# Patient Record
Sex: Male | Born: 1935 | Race: Black or African American | Hispanic: No | Smoking: Former smoker
Health system: Southern US, Community
[De-identification: ages and names within clinical notes are randomized; demographics above are authoritative.]

## PROBLEM LIST (undated history)

## (undated) ENCOUNTER — Emergency Department: Payer: Medicare (Managed Care)

## (undated) DIAGNOSIS — M889 Osteitis deformans of unspecified bone: Secondary | ICD-10-CM

## (undated) DIAGNOSIS — N401 Enlarged prostate with lower urinary tract symptoms: Secondary | ICD-10-CM

## (undated) DIAGNOSIS — N138 Other obstructive and reflux uropathy: Secondary | ICD-10-CM

## (undated) DIAGNOSIS — C61 Malignant neoplasm of prostate: Secondary | ICD-10-CM

## (undated) DIAGNOSIS — E785 Hyperlipidemia, unspecified: Secondary | ICD-10-CM

## (undated) HISTORY — DX: Malignant neoplasm of prostate: C61

## (undated) HISTORY — PX: PROSTATE SURGERY: SHX751

## (undated) HISTORY — DX: Hyperlipidemia, unspecified: E78.5

## (undated) HISTORY — PX: CHOLECYSTECTOMY: SHX55

## (undated) HISTORY — DX: Osteitis deformans of unspecified bone: M88.9

## (undated) HISTORY — DX: Other obstructive and reflux uropathy: N40.1

## (undated) HISTORY — DX: Benign prostatic hyperplasia with lower urinary tract symptoms: N13.8

## (undated) HISTORY — PX: PROSTATE BIOPSY: SHX241

---

## 2004-02-10 ENCOUNTER — Emergency Department: Payer: Self-pay | Admitting: Unknown Physician Specialty

## 2004-02-10 ENCOUNTER — Emergency Department: Payer: Self-pay | Admitting: General Practice

## 2005-01-22 ENCOUNTER — Ambulatory Visit: Payer: Self-pay | Admitting: Ophthalmology

## 2005-05-20 ENCOUNTER — Ambulatory Visit: Payer: Self-pay | Admitting: Internal Medicine

## 2005-05-24 ENCOUNTER — Ambulatory Visit: Payer: Self-pay | Admitting: Specialist

## 2007-04-08 ENCOUNTER — Emergency Department: Payer: Self-pay | Admitting: Emergency Medicine

## 2007-04-08 ENCOUNTER — Other Ambulatory Visit: Payer: Self-pay

## 2007-04-28 ENCOUNTER — Ambulatory Visit: Payer: Self-pay | Admitting: Internal Medicine

## 2007-05-05 ENCOUNTER — Ambulatory Visit: Payer: Self-pay | Admitting: Internal Medicine

## 2007-05-18 ENCOUNTER — Ambulatory Visit: Payer: Self-pay | Admitting: Internal Medicine

## 2007-11-09 ENCOUNTER — Ambulatory Visit: Payer: Self-pay | Admitting: Specialist

## 2007-11-25 ENCOUNTER — Ambulatory Visit: Payer: Self-pay | Admitting: Specialist

## 2007-12-02 ENCOUNTER — Ambulatory Visit: Payer: Self-pay | Admitting: Specialist

## 2007-12-08 ENCOUNTER — Ambulatory Visit: Payer: Self-pay | Admitting: Internal Medicine

## 2008-06-23 ENCOUNTER — Ambulatory Visit: Payer: Self-pay | Admitting: Internal Medicine

## 2008-11-04 ENCOUNTER — Ambulatory Visit: Payer: Self-pay | Admitting: Internal Medicine

## 2009-01-30 ENCOUNTER — Ambulatory Visit: Payer: Self-pay

## 2010-07-17 ENCOUNTER — Observation Stay: Payer: Self-pay | Admitting: Internal Medicine

## 2011-03-12 ENCOUNTER — Ambulatory Visit: Payer: Self-pay | Admitting: Family

## 2011-12-02 ENCOUNTER — Ambulatory Visit: Payer: Self-pay | Admitting: Ophthalmology

## 2011-12-10 ENCOUNTER — Ambulatory Visit: Payer: Self-pay | Admitting: Ophthalmology

## 2012-02-05 ENCOUNTER — Ambulatory Visit: Payer: Self-pay | Admitting: Ophthalmology

## 2012-04-22 ENCOUNTER — Ambulatory Visit: Payer: Self-pay | Admitting: Internal Medicine

## 2012-11-05 ENCOUNTER — Ambulatory Visit: Payer: Self-pay | Admitting: Urology

## 2012-11-16 ENCOUNTER — Emergency Department: Payer: Self-pay | Admitting: Emergency Medicine

## 2012-11-25 ENCOUNTER — Ambulatory Visit: Payer: Self-pay | Admitting: Internal Medicine

## 2012-11-26 DIAGNOSIS — C7951 Secondary malignant neoplasm of bone: Secondary | ICD-10-CM | POA: Insufficient documentation

## 2012-11-27 ENCOUNTER — Ambulatory Visit: Payer: Self-pay | Admitting: Internal Medicine

## 2012-12-14 ENCOUNTER — Ambulatory Visit: Payer: Self-pay | Admitting: Internal Medicine

## 2013-04-19 ENCOUNTER — Ambulatory Visit: Payer: Self-pay | Admitting: Urology

## 2013-09-10 ENCOUNTER — Ambulatory Visit: Payer: Self-pay | Admitting: Internal Medicine

## 2014-05-03 NOTE — Op Note (Signed)
PATIENT NAME:  Dean White, Dean White MR#:  [id number] DATE OF BIRTH:  August 08, 1928  DATE OF PROCEDURE:  12/10/2011  PREOPERATIVE DIAGNOSIS: Visually significant cataract of the right eye.   POSTOPERATIVE DIAGNOSIS: Visually significant cataract of the right eye.   OPERATIVE PROCEDURE: Cataract extraction by phacoemulsification with implant of intraocular lens to right eye.   SURGEON: Birder Robson, MD.   ANESTHESIA:  1. Managed anesthesia care.  2. Topical tetracaine drops followed by 2% Xylocaine jelly applied in the preoperative holding area.   COMPLICATIONS: None.   TECHNIQUE:  Stop and chop.  DESCRIPTION OF PROCEDURE: The patient was examined and consented in the preoperative holding area where the aforementioned topical anesthesia was applied to the right eye and then brought back to the Operating Room where the right eye was prepped and draped in the usual sterile ophthalmic fashion and a lid speculum was placed. A paracentesis was created with the side port blade and the anterior chamber was filled with viscoelastic. A near clear corneal incision was performed with the steel keratome. A continuous curvilinear capsulorrhexis was performed with a cystotome followed by the capsulorrhexis forceps. Hydrodissection and hydrodelineation were carried out with BSS on a blunt cannula. The lens was removed in a stop and chop technique and the remaining cortical material was removed with the irrigation-aspiration handpiece. The capsular bag was inflated with viscoelastic and the Tecnis ZCB00 20.5-diopter lens, serial number [id number] was placed in the capsular bag without complication. The remaining viscoelastic was removed from the eye with the irrigation-aspiration handpiece. The wounds were hydrated. The anterior chamber was flushed with Miostat and the eye was inflated to physiologic pressure. The wounds were found to be water tight. The eye was dressed with Vigamox. The patient was given protective  glasses to wear throughout the day and a shield with which to sleep tonight. The patient was also given drops with which to begin a drop regimen today and will follow-up with me in one day.  ____________________________ Livingston Diones. Trejan Buda, MD wlp:slb D: 12/10/2011 16:33:04 ET T: 12/10/2011 16:51:10 ET JOB#: [id number]  cc: Sinclaire Artiga L. Uri Covey, MD, <Dictator> Livingston Diones Jabez Molner MD ELECTRONICALLY SIGNED 12/19/2011 15:44

## 2014-05-06 NOTE — Op Note (Signed)
PATIENT NAME:  Dean White, Dean White MR#:  [id number] DATE OF BIRTH:  1928/11/05  DATE OF PROCEDURE:  02/05/2012  PROCEDURES PERFORMED:  1.  Pars plana vitrectomy of the right eye.  2.  Internal limiting membrane peel of the right eye.   PREOPERATIVE DIAGNOSES:  1.  Epiretinal membrane of the right eye.  2.  Retinal edema of the right eye.   POSTOPERATIVE DIAGNOSES: 1.  Epiretinal membrane of the right eye.  2.  Retinal edema of the right eye.   ESTIMATED BLOOD LOSS: Less than 1 mL.  PRIMARY SURGEON: Garlan Fair, M.D.   ANESTHESIA: Retrobulbar block of the right eye with monitored anesthesia care.   COMPLICATIONS: None.   INDICATIONS FOR PROCEDURE: This patient presented to my office with slowly decreasing vision in the right eye. Examination revealed an epiretinal membrane with associated retinal edema. The risks, benefits, and alternatives of the above procedure were discussed and the patient wished to proceed.   DETAILS OF PROCEDURE: After informed consent was obtained, the patient was brought into the operative suite at [facility]. The patient was placed in supine position, was given a small dose of Alfenta and a retrobulbar block was performed on the right eye by the primary surgeon without complication. The right eye was prepped and draped in sterile manner. After a lid speculum was inserted, a 25-gauge trocar was placed inferotemporally through displaced conjunctiva in an oblique fashion 3 mm beyond the limbus. The infusion cannula was turned on and inserted through the trocar and secured in position with Steri-Strips. Two more trocars were placed in a similar fashion superotemporally and superonasally. The vitreous cutter and light pipe were introduced in the eye and a core vitrectomy was performed. Vitreous face was confirmed and elevated via suction and visualization. Vitreous face was removed and peripheral vitreous was trimmed for 360 degrees. Indocyanine  green was injected onto the posterior pole and removed within 30 seconds. Intraocular forceps were utilized to remove the epiretinal membrane and internal limiting membrane in total for a total diameter of 2 disk diameters around the fovea. Scleral depressed exam was performed and no signs of any breaks, tears, or retinal detachment could be identified for 360 degrees. A partial air-fluid exchange was performed. The trocars were removed and the wounds were noted to be airtight. Pressure in the eye was confirmed to be approximately 15 mmHg and 5 mg of dexamethasone was given into the inferior fornix. The lid speculum was removed and the eye was cleaned. TobraDex was placed on the eye. A patch and shield were placed over the eye and the patient was taken to postanesthesia care with instructions to remain head up.   ____________________________ Teresa Pelton. Starling Manns, MD mfa:aw D: 02/05/2012 09:05:17 ET T: 02/05/2012 09:31:21 ET JOB#: [id number]  cc: Teresa Pelton. Starling Manns, MD, <Dictator> Coralee Rud MD ELECTRONICALLY SIGNED 02/05/2012 9:59

## 2014-05-31 ENCOUNTER — Ambulatory Visit
Admission: RE | Admit: 2014-05-31 | Discharge: 2014-05-31 | Disposition: A | Discharge status: Home / Self Care | Payer: Medicare Other | Source: Ambulatory Visit | Attending: Internal Medicine | Admitting: Internal Medicine

## 2014-05-31 ENCOUNTER — Other Ambulatory Visit: Payer: Self-pay | Admitting: Internal Medicine

## 2014-05-31 ENCOUNTER — Ambulatory Visit
Admission: RE | Admit: 2014-05-31 | Discharge: 2014-05-31 | Disposition: A | Discharge status: Home / Self Care | Payer: Medicare Other | Source: Ambulatory Visit | Attending: Cardiology | Admitting: Cardiology

## 2014-05-31 DIAGNOSIS — R079 Chest pain, unspecified: Secondary | ICD-10-CM

## 2014-07-06 ENCOUNTER — Encounter (INDEPENDENT_AMBULATORY_CARE_PROVIDER_SITE_OTHER): Payer: Self-pay

## 2014-07-06 ENCOUNTER — Ambulatory Visit (INDEPENDENT_AMBULATORY_CARE_PROVIDER_SITE_OTHER): Payer: Medicare Other | Admitting: Urology

## 2014-07-06 ENCOUNTER — Encounter: Payer: Self-pay | Admitting: Urology

## 2014-07-06 VITALS — BP 160/72 | HR 82 | Ht 67.0 in | Wt 174.3 lb

## 2014-07-06 DIAGNOSIS — C61 Malignant neoplasm of prostate: Secondary | ICD-10-CM

## 2014-07-06 MED ORDER — LEUPROLIDE ACETATE (3 MONTH) 22.5 MG IM KIT
22.5000 mg | PACK | Freq: Once | INTRAMUSCULAR | Status: AC
  Administered 2014-07-06: 22.5 mg via INTRAMUSCULAR

## 2014-07-06 NOTE — Progress Notes (Signed)
Lupron IM Injection   Due to Prostate Cancer patient is present today for a Lupron Injection.  Medication: Lupron 3 month Dose: 22.5mg   Location: left upper outer buttocks Lot: [id number] Exp: [id number]  Patient tolerated well, no complications were noted  Performed by: Lyndee Hensen CMA  Follow up: 3 months

## 2014-07-06 NOTE — Progress Notes (Signed)
07/06/2014 10:49 AM   Dean White 1928-02-04 [id number]  Referring provider: Cletis Athens, MD [address] [place], [place] [address]  Chief Complaint  Patient presents with  . 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 PSA History:    7.3 ng/mL on 00/00/2013    9.4 ng/mL on 08/00/2013    8.3 ng/mL on 02/00/2014  11.3 ng/mL on 09/00/2014- Patient was started on ADT and Xgeva    2.2 ng/mL on 09/13/2013  13.3 ng/mL on 03/18/2014-Patient had discontinued ADT  Bone scan completed on 11/05/2012 noted 3 different areas of high suspicion for metastatic disease.    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 Patient was seen by Dean White and given Reclast.  Patient was restarted on his Lupron three months ago.  He is having severe hot flashes and would like to discontinue the Lupron.   His most recent PSA is 2.0 ng/mL drawn at his PCP's office last month.     PMH: Past Medical History  Diagnosis Date  . HLD (hyperlipidemia)   . Paget disease of bone   . Adenocarcinoma of prostate   . BPH with obstruction/lower urinary tract symptoms     Surgical History: Past Surgical History  Procedure Laterality Date  . Cholecystectomy    . Prostate surgery    . Prostate biopsy      Home Medications:    Medication List       This list is accurate as of: 07/06/14 10:49 AM.  Always use your most recent med list.               aspirin 81 MG tablet  Take 81 mg by mouth daily.     cholecalciferol 1000 UNITS tablet  Commonly known as:  VITAMIN D  Take 1,000 Units by mouth daily.     gabapentin 100 MG capsule  Commonly known as:  NEURONTIN  Take 100 mg by mouth 3 (three) times daily.     latanoprost 0.005 % ophthalmic solution  Commonly known as:  XALATAN  place 1 drop into both eyes at bedtime     lovastatin 40 MG tablet  Commonly known as:  MEVACOR  Take 40 mg by mouth at bedtime.     multivitamin tablet  Take 1 tablet by mouth daily.     omeprazole 40 MG capsule  Commonly known as:  PRILOSEC  Take 40 mg by mouth daily.     PROSTATE HEALTH PO  Take by mouth.        Allergies: No Known Allergies  Family History: Family History  Problem Relation Age of Onset  . Cancer Son     melanoma  . Kidney disease Neg Hx   . Prostate cancer Neg Hx   . Breast cancer Sister   . Cancer Brother     Social History:  reports that he has quit smoking. He does not have any smokeless tobacco history on file. He reports that he does not drink alcohol or use illicit drugs.  ROS: Urological Symptom Review  Patient is experiencing the following symptoms: Erection problems (male only)   Review of Systems  Gastrointestinal (upper)  :  Negative for upper GI symptoms  Gastrointestinal (lower) : Negative for lower GI symptoms  Constitutional : Night Sweats Fatigue  Skin: Negative for skin symptoms  Eyes: Negative for eye symptoms  Ear/Nose/Throat : Negative for Ear/Nose/Throat symptoms  Hematologic/Lymphatic: Negative for Hematologic/Lymphatic symptoms  Cardiovascular : Negative for cardiovascular symptoms  Respiratory : Cough  Endocrine: Negative for endocrine symptoms  Musculoskeletal: Back pain Joint pain  Neurological: Negative for neurological symptoms  Psychologic: Negative for psychiatric symptoms   Physical Exam: BP 160/72 mmHg  Pulse 82  Ht 5\' 7"  (1.702 m)  Wt 174 lb 4.8 oz (79.062 kg)  BMI 27.29 kg/m2   Laboratory Data: No results found for this or any previous visit. No results found for: WBC, HGB, HCT, MCV, PLT  No results found for: CREATININE  No results found for: PSA  No results found for: TESTOSTERONE  No  results found for: HGBA1C  Urinalysis No results found for: COLORURINE, APPEARANCEUR, LABSPEC, PHURINE, GLUCOSEU, HGBUR, BILIRUBINUR, KETONESUR, PROTEINUR, UROBILINOGEN, NITRITE, LEUKOCYTESUR  Pertinent Imaging:   Assessment & Plan:    1. Prostate cancer:  TURP in 2009 found inverted papilloma. TRUSPBx of prostate 11/2008 noted a Gleason's 6 (3+3) in one core with a PSA of 5.5 ng/mL and a prostate volume of 50cc. PSA History: 7.3 ng/mL on 00/00/2013 9.4 ng/mL on 08/00/2013 8.3 ng/mL on 02/00/2014 11.3 ng/mL on 09/00/2014 Lupron and Xgeva was discontinued on 08/00/2015 2.2 ng/mL on 09/13/2013 13.3 ng/mL on 03/18/2014-Patient had discontinued ADT 2.0 ng/mL last month, verbal per PCP's office.  Patient would not sign the medical release for his records.    I spoke with Dean White concerning this patient. He is treating his Paget's disease. His bone scan was negative for mets and he was given a Reclast injection. We restarted his Lupron three months ago.  He is having severe hot flashes.  I discussed with the patient that the hot flashes may diminish over time.  He has agreed to one more injection and then we will reassess at his return appointment in three months.  We will obtain a PSA at that visit.     No Follow-up on file.  Dean White, [facility] [address], [place], [place] [address] [phone]  n

## 2014-07-10 DIAGNOSIS — C61 Malignant neoplasm of prostate: Secondary | ICD-10-CM | POA: Insufficient documentation

## 2014-10-06 ENCOUNTER — Encounter: Payer: Self-pay | Admitting: Urology

## 2014-10-06 ENCOUNTER — Ambulatory Visit (INDEPENDENT_AMBULATORY_CARE_PROVIDER_SITE_OTHER): Payer: Medicare Other | Admitting: Urology

## 2014-10-06 VITALS — BP 117/74 | HR 67 | Ht 68.0 in | Wt 177.1 lb

## 2014-10-06 DIAGNOSIS — C61 Malignant neoplasm of prostate: Secondary | ICD-10-CM

## 2014-10-06 DIAGNOSIS — IMO0002 Reserved for concepts with insufficient information to code with codable children: Secondary | ICD-10-CM

## 2014-10-06 DIAGNOSIS — N359 Urethral stricture, unspecified: Secondary | ICD-10-CM

## 2014-10-06 MED ORDER — LEUPROLIDE ACETATE (3 MONTH) 22.5 MG IM KIT
22.5000 mg | PACK | Freq: Once | INTRAMUSCULAR | Status: AC
  Administered 2014-10-06: 22.5 mg via INTRAMUSCULAR

## 2014-10-06 NOTE — Progress Notes (Signed)
Lupron IM Injection   Due to Prostate Cancer patient is present today for a Lupron Injection.  Medication: Lupron 3 month Dose: 22.5mg  mg  Location: left upper outer buttocks Lot: [id number] Exp: 02/02/2017  Patient tolerated well, no complications were noted  Performed by: Lyndee Hensen CMA  Follow up: 3 months

## 2014-10-06 NOTE — Progress Notes (Signed)
10/06/2014 10:58 AM   Dean White [birth year] [id number]  Referring provider: Cletis Athens, MD [address] [place], [place] [address]  Chief Complaint  Patient presents with  . Follow-up    Prostate cancer and lupron injection    HPI: Patient is an 81 year old African-American male with metastatic prostate cancer who presents today for his 3 month Lupron injection.  He is complaining of intense hot flashes.  The hot flashes are becoming more intense and frequent.  He is also complaining of some difficulty in passing his urine.  He does feel that he is emptying his bladder completely.  He denies any weight loss or appetite loss. He also denies any fevers, chills, nausea or vomiting.  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  Bone scan completed on 11/05/2012 noted 3 different areas of high suspicion for metastatic disease. 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 Patient was seen by Dr. Ronnald Collum and given Reclast.  PMH: Past Medical History  Diagnosis Date  . HLD (hyperlipidemia)   . Paget disease of bone   . Adenocarcinoma of prostate   . BPH with obstruction/lower urinary tract symptoms     Surgical History: Past Surgical History  Procedure Laterality Date  . Cholecystectomy    . Prostate surgery    . Prostate biopsy      Home Medications:    Medication List       This list is accurate as of: 10/06/14 10:58 AM.  Always use your most recent med list.               aspirin 81 MG tablet  Take 81 mg by mouth daily.     cholecalciferol 1000 UNITS tablet  Commonly known as:  VITAMIN D  Take 1,000 Units by mouth daily.     gabapentin 100 MG capsule  Commonly known as:  NEURONTIN  Take 100 mg by mouth 3 (three) times  daily.     latanoprost 0.005 % ophthalmic solution  Commonly known as:  XALATAN  place 1 drop into both eyes at bedtime     lovastatin 40 MG tablet  Commonly known as:  MEVACOR  Take 40 mg by mouth at bedtime.     multivitamin tablet  Take 1 tablet by mouth daily.     omeprazole 40 MG capsule  Commonly known as:  PRILOSEC  Take 40 mg by mouth daily.     PROSTATE HEALTH PO  Take by mouth.        Allergies: No Known Allergies  Family History: Family History  Problem Relation Age of Onset  . Cancer Son     melanoma  . Kidney disease Neg Hx   . Prostate cancer Neg Hx   . Breast cancer Sister   . Cancer Brother     Social History:  reports that he has quit smoking. He does not have any smokeless tobacco history on file. He reports that he does not drink alcohol or use illicit drugs.  ROS: UROLOGY Frequent Urination?: No Hard to postpone urination?: No Burning/pain with urination?: No Get up at night to urinate?: No Leakage of urine?: No Urine stream starts and stops?: No Trouble starting stream?: No Do you have to  strain to urinate?: No Blood in urine?: No Urinary tract infection?: No Sexually transmitted disease?: No Injury to kidneys or bladder?: No Painful intercourse?: No Weak stream?: No Erection problems?: No Penile pain?: No  Gastrointestinal Nausea?: No Vomiting?: No Indigestion/heartburn?: No Diarrhea?: No Constipation?: No  Constitutional Fever: No Night sweats?: Yes Weight loss?: No Fatigue?: No  Skin Skin rash/lesions?: No Itching?: No  Eyes Blurred vision?: No Double vision?: No  Ears/Nose/Throat Sore throat?: No Sinus problems?: No  Hematologic/Lymphatic Swollen glands?: No Easy bruising?: No  Cardiovascular Leg swelling?: No Chest pain?: No  Respiratory Cough?: No Shortness of breath?: No  Endocrine Excessive thirst?: No  Musculoskeletal Back pain?: Yes Joint pain?: Yes  Neurological Headaches?:  No Dizziness?: No  Psychologic Depression?: No Anxiety?: No  Physical Exam: BP 117/74 mmHg  Pulse 67  Ht 5\' 8"  (1.727 m)  Wt 177 lb 1.6 oz (80.332 kg)  BMI 26.93 kg/m2  GU: Patient with uncircumcised phallus. Foreskin easily retracted  Urethral meatus is small in caliber.  No penile discharge. No penile lesions or rashes. Scrotum without lesions, cysts, rashes and/or edema.  Testicles are located scrotally bilaterally. No masses are appreciated in the testicles. Left and right epididymis are normal. Rectal: Patient with  normal sphincter tone. Perineum without scarring or rashes. No rectal masses are appreciated. Prostate is approximately 60 grams, no nodules are appreciated. Seminal vesicles are normal.   Laboratory Data:  PSA History:  7.3 ng/mL on 00/00/2013  9.4 ng/mL on 08/00/2013  8.3 ng/mL on 02/00/2014 11.3 ng/mL on 09/00/2014- Patient was started on ADT and Xgeva  2.2 ng/mL on 09/13/2013 13.3 ng/mL on 03/18/2014-Patient had discontinued ADT    2.0 ng/mL on 05/00/2016  Assessment & Plan:    1. Prostate cancer:  TURP in 2009 found inverted papilloma. TRUSPBx of prostate 11/2008 noted a Gleason's 6 (3+3) in one core with a PSA of 5.5 ng/mL and a prostate volume of 50cc.   He is still having severe hot flashes. I discussed with the patient that the hot flashes may diminish over time. Patient wanted to know if he could discontinue the Lupron injections. I explained to him the role of Lupron with metastatic prostate cancer and the likelihood that the prostate cancer would continue to spread without ADT. He would like to continue the Lupron at this time.  He did receive a 3 month Lupron injection at this visit. He will return in 3 months for his next Lupron injection.   - PSA  2. Meatal stenosis:   Patient's meatus is of a reduced caliber.  He is still able to void at this time and does not complain of incomplete bladder emptying or suprapubic pain.  I advised him on  foreskin hygiene (pulling back the foreskin, washing the glands with soapy water and drying with a hairdryer twice daily).  We will reassess when he returns in 3 months.  If he is unable to void, he needs to contact our office immediately or seek treatment in the ED.     Return in about 3 months (around 01/05/2015) for Lupron injection and office visit.  [name], [facility] [address], [place], [place] [address] [phone]

## 2014-10-07 LAB — PSA: PROSTATE SPECIFIC AG, SERUM: 1.4 ng/mL (ref 0.0–4.0)

## 2014-10-17 ENCOUNTER — Telehealth: Payer: Self-pay | Admitting: *Deleted

## 2014-10-17 NOTE — Telephone Encounter (Signed)
Spoke with patient, gave results and next appointment info. Labs to be drawn one week prior to appt. Patient ok with plan

## 2014-10-17 NOTE — Telephone Encounter (Signed)
-----   Message from Nori Riis, PA-C sent at 10/17/2014  2:43 PM EDT ----- Patient's PSA is stable.  We repeat in  6 months.  PSA to be drawn before his next appointment.

## 2015-01-02 ENCOUNTER — Ambulatory Visit: Payer: Medicare Other | Admitting: Urology

## 2015-01-03 ENCOUNTER — Ambulatory Visit: Payer: Medicare Other | Admitting: Urology

## 2015-01-17 ENCOUNTER — Ambulatory Visit (INDEPENDENT_AMBULATORY_CARE_PROVIDER_SITE_OTHER): Payer: Medicare Other | Admitting: Urology

## 2015-01-17 ENCOUNTER — Encounter: Payer: Self-pay | Admitting: Urology

## 2015-01-17 ENCOUNTER — Encounter: Payer: Self-pay | Admitting: Endocrinology

## 2015-01-17 VITALS — BP 133/78 | HR 84 | Ht 67.0 in | Wt 177.8 lb

## 2015-01-17 DIAGNOSIS — Z8546 Personal history of malignant neoplasm of prostate: Secondary | ICD-10-CM

## 2015-01-17 DIAGNOSIS — C61 Malignant neoplasm of prostate: Secondary | ICD-10-CM

## 2015-01-17 MED ORDER — LEUPROLIDE ACETATE (3 MONTH) 22.5 MG IM KIT
22.5000 mg | PACK | Freq: Once | INTRAMUSCULAR | Status: AC
  Administered 2015-01-17: 22.5 mg via INTRAMUSCULAR

## 2015-01-17 NOTE — Progress Notes (Signed)
12:13 PM   Dean White [birth year] [id number]  Referring provider: Cletis Athens, MD [address] [place], [place] [address]  Chief Complaint  Patient presents with  . Prostate Cancer    3 month Lupron injection    HPI: Patient is an 82 year old African-American male with metastatic prostate cancer who presents today for his 3 month Lupron injection.  He is complaining of intense hot flashes.  The hot flashes are becoming more intense and frequent.   He denies any weight loss or appetite loss. He also denies any fevers, chills, nausea or vomiting.  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  Bone scan completed on 11/05/2012 noted 3 different areas of high suspicion for metastatic disease. 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 Patient was seen by Dean White and given Reclast.   He has an upcoming appointment for a repeated bone scan next week.  PMH: Past Medical History  Diagnosis Date  . HLD (hyperlipidemia)   . Paget disease of bone   . Adenocarcinoma of prostate ([place])   . BPH with obstruction/lower urinary tract symptoms     Surgical History: Past Surgical History  Procedure Laterality Date  . Cholecystectomy    . Prostate surgery    . Prostate biopsy      Home Medications:    Medication List       This list is accurate as of: 01/17/15 11:59 PM.  Always use your most recent med list.               aspirin 81 MG tablet  Take 81 mg by mouth daily.     cholecalciferol 1000 units tablet  Commonly known as:  VITAMIN D  Take 1,000 Units by mouth daily.     gabapentin 100 MG capsule  Commonly known as:  NEURONTIN  Take 100 mg by mouth 3 (three) times daily.     latanoprost 0.005 % ophthalmic solution  Commonly  known as:  XALATAN  place 1 drop into both eyes at bedtime     lovastatin 40 MG tablet  Commonly known as:  MEVACOR  Take 40 mg by mouth at bedtime.     multivitamin tablet  Take 1 tablet by mouth daily.     omeprazole 40 MG capsule  Commonly known as:  PRILOSEC  Take 40 mg by mouth daily. Reported on 01/17/2015     PROSTATE HEALTH PO  Take by mouth.        Allergies: No Known Allergies  Family History: Family History  Problem Relation Age of Onset  . Cancer Son     melanoma  . Kidney disease Neg Hx   . Prostate cancer Neg Hx   . Breast cancer Sister   . Cancer Brother     Social History:  reports that he has quit smoking. He does not have any smokeless tobacco history on file. He reports that he does not drink alcohol or use illicit drugs.  ROS: UROLOGY Frequent Urination?: No Hard to postpone urination?: No Burning/pain with urination?: No Get up at night to urinate?: No Leakage of urine?: No Urine stream starts and stops?: Yes Trouble starting stream?: No Do you have to strain to urinate?: No  Blood in urine?: No Urinary tract infection?: No Sexually transmitted disease?: No Injury to kidneys or bladder?: No Painful intercourse?: No Weak stream?: No Erection problems?: No Penile pain?: No  Gastrointestinal Nausea?: No Vomiting?: No Indigestion/heartburn?: Yes Diarrhea?: No Constipation?: No  Constitutional Fever: No Night sweats?: Yes Weight loss?: Yes Fatigue?: Yes  Skin Skin rash/lesions?: No Itching?: No  Eyes Blurred vision?: No Double vision?: No  Ears/Nose/Throat Sore throat?: No Sinus problems?: No  Hematologic/Lymphatic Swollen glands?: No Easy bruising?: No  Cardiovascular Leg swelling?: No Chest pain?: No  Respiratory Cough?: No Shortness of breath?: No  Endocrine Excessive thirst?: No  Musculoskeletal Back pain?: Yes Joint pain?: Yes  Neurological Headaches?: No Dizziness?: No  Psychologic Depression?:  No Anxiety?: No  Physical Exam: BP 133/78 mmHg  Pulse 84  Ht 5\' 7"  (1.702 m)  Wt 177 lb 12.8 oz (80.65 kg)  BMI 27.84 kg/m2  GU: Patient with uncircumcised phallus. Foreskin easily retracted  Urethral meatus is small in caliber.  No penile discharge. No penile lesions or rashes. Scrotum without lesions, cysts, rashes and/or edema.  Testicles are located scrotally bilaterally. No masses are appreciated in the testicles. Left and right epididymis are normal. Rectal: Patient with  normal sphincter tone. Perineum without scarring or rashes. No rectal masses are appreciated. Prostate is approximately 60 grams, no nodules are appreciated. Seminal vesicles are normal.   Laboratory Data:  PSA History:  7.3 ng/mL on 00/00/2013  9.4 ng/mL on 08/00/2013  8.3 ng/mL on 02/00/2014 11.3 ng/mL on 09/00/2014- Patient was started on ADT and Xgeva  2.2 ng/mL on 09/13/2013 13.3 ng/mL on 03/18/2014-Patient had discontinued ADT    2.0 ng/mL on 05/00/2016    1.2 ng/ml on 12/30/2014  Assessment & Plan:    1. Prostate cancer:  TURP in 2009 found inverted papilloma. TRUSPBx of prostate 11/2008 noted a Gleason's 6 (3+3) in one core with a PSA of 5.5 ng/mL and a prostate volume of 50cc.   He is still having severe hot flashes. I explained to him the role of Lupron with metastatic prostate cancer and the likelihood that the prostate cancer would continue to spread without ADT.  I also explained that he did have some areas of possible passed disease on a bone scan and he is at risk of a pathological fracture if he does not continue the ADT therapy.   I offered him a referral to the cancer center, so that he may discuss other options of treatments that may be available to him.   He does not want a referral to the cancer Center at this time and he would like to continue the Lupron at this time.  He did receive a 3 month Lupron injection at this visit. He will return in 3 months for his next Lupron injection.      Return in about 3 months (around 04/17/2015) for lupron and exam.  Dean White, [facility] [address], [place], [place] [address] [phone]

## 2015-01-17 NOTE — Progress Notes (Signed)
Lupron IM Injection   Due to Prostate Cancer patient is present today for a Lupron Injection.  Medication: Lupron 3 month Dose: 22.5 mg  Location: left upper outer buttocks Lot: [id number] Exp: [id number]  Patient tolerated well, no complications were noted  Performed by: Lyndee Hensen CMA  Follow up: 3 months

## 2015-01-19 DIAGNOSIS — M5136 Other intervertebral disc degeneration, lumbar region: Secondary | ICD-10-CM | POA: Diagnosis not present

## 2015-01-19 DIAGNOSIS — M1990 Unspecified osteoarthritis, unspecified site: Secondary | ICD-10-CM | POA: Diagnosis not present

## 2015-01-19 DIAGNOSIS — M8888 Osteitis deformans of other bones: Secondary | ICD-10-CM | POA: Diagnosis not present

## 2015-01-19 DIAGNOSIS — E782 Mixed hyperlipidemia: Secondary | ICD-10-CM | POA: Diagnosis not present

## 2015-01-19 DIAGNOSIS — M13832 Other specified arthritis, left wrist: Secondary | ICD-10-CM | POA: Diagnosis not present

## 2015-01-19 DIAGNOSIS — N400 Enlarged prostate without lower urinary tract symptoms: Secondary | ICD-10-CM | POA: Diagnosis not present

## 2015-01-19 DIAGNOSIS — K219 Gastro-esophageal reflux disease without esophagitis: Secondary | ICD-10-CM | POA: Diagnosis not present

## 2015-01-26 DIAGNOSIS — N400 Enlarged prostate without lower urinary tract symptoms: Secondary | ICD-10-CM | POA: Diagnosis not present

## 2015-01-26 DIAGNOSIS — C61 Malignant neoplasm of prostate: Secondary | ICD-10-CM | POA: Diagnosis not present

## 2015-01-26 DIAGNOSIS — K219 Gastro-esophageal reflux disease without esophagitis: Secondary | ICD-10-CM | POA: Diagnosis not present

## 2015-01-26 DIAGNOSIS — M1990 Unspecified osteoarthritis, unspecified site: Secondary | ICD-10-CM | POA: Diagnosis not present

## 2015-01-26 DIAGNOSIS — M8888 Osteitis deformans of other bones: Secondary | ICD-10-CM | POA: Diagnosis not present

## 2015-01-26 DIAGNOSIS — E782 Mixed hyperlipidemia: Secondary | ICD-10-CM | POA: Diagnosis not present

## 2015-01-26 DIAGNOSIS — M5136 Other intervertebral disc degeneration, lumbar region: Secondary | ICD-10-CM | POA: Diagnosis not present

## 2015-02-14 DIAGNOSIS — H40003 Preglaucoma, unspecified, bilateral: Secondary | ICD-10-CM | POA: Diagnosis not present

## 2015-04-10 DIAGNOSIS — N400 Enlarged prostate without lower urinary tract symptoms: Secondary | ICD-10-CM | POA: Diagnosis not present

## 2015-04-10 DIAGNOSIS — M1990 Unspecified osteoarthritis, unspecified site: Secondary | ICD-10-CM | POA: Diagnosis not present

## 2015-04-10 DIAGNOSIS — M5136 Other intervertebral disc degeneration, lumbar region: Secondary | ICD-10-CM | POA: Diagnosis not present

## 2015-04-10 DIAGNOSIS — C61 Malignant neoplasm of prostate: Secondary | ICD-10-CM | POA: Diagnosis not present

## 2015-04-10 DIAGNOSIS — K219 Gastro-esophageal reflux disease without esophagitis: Secondary | ICD-10-CM | POA: Diagnosis not present

## 2015-04-10 DIAGNOSIS — E782 Mixed hyperlipidemia: Secondary | ICD-10-CM | POA: Diagnosis not present

## 2015-04-10 DIAGNOSIS — M8888 Osteitis deformans of other bones: Secondary | ICD-10-CM | POA: Diagnosis not present

## 2015-05-09 DIAGNOSIS — M5093 Cervical disc disorder, unspecified, cervicothoracic region: Secondary | ICD-10-CM | POA: Diagnosis not present

## 2015-05-09 DIAGNOSIS — M818 Other osteoporosis without current pathological fracture: Secondary | ICD-10-CM | POA: Diagnosis not present

## 2015-05-09 DIAGNOSIS — M76891 Other specified enthesopathies of right lower limb, excluding foot: Secondary | ICD-10-CM | POA: Diagnosis not present

## 2015-07-20 ENCOUNTER — Ambulatory Visit (INDEPENDENT_AMBULATORY_CARE_PROVIDER_SITE_OTHER): Payer: Medicare Other | Admitting: Urology

## 2015-07-20 ENCOUNTER — Encounter: Payer: Self-pay | Admitting: Urology

## 2015-07-20 VITALS — BP 133/65 | HR 56 | Ht 67.0 in | Wt 165.0 lb

## 2015-07-20 DIAGNOSIS — C61 Malignant neoplasm of prostate: Secondary | ICD-10-CM | POA: Diagnosis not present

## 2015-07-20 DIAGNOSIS — Z8546 Personal history of malignant neoplasm of prostate: Secondary | ICD-10-CM | POA: Diagnosis not present

## 2015-07-20 NOTE — Progress Notes (Signed)
10:15 AM   Dean White 07/08/1928 [id number]  Referring provider: Cletis Athens, MD [address] [place], [place] [address]  Chief Complaint  Patient presents with  . Prostate Cancer    6 month follow up    HPI: Patient is an 82 year old AAM with metastatic prostate cancer who presents today for his 3 month Lupron injection.   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  Bone scan completed on 11/05/2012 noted 3 different areas of high suspicion for metastatic disease. 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 Patient is seeing Dr. Ronnald Collum and given Reclast.   He had a sheet of paper with him with his bone scan report.  It was interpreted as no metastatis or Paget's disease.    He is complaining of intense hot flashes.  The hot flashes are becoming more intense and frequent.   He denies any weight loss or appetite loss. He also denies any fevers, chills, nausea or vomiting.  He would like to discontinue the Lupron injections at this time.  He states the hot flashes are impacting his life and is very negative manner.  He has frequent hot flashes through the day and has to take several showers to cool down.    His IPSS score today is 1/1.       IPSS      07/20/15 0900       International Prostate Symptom Score   How often have you had the sensation of not emptying your bladder? Not at All     How often have you had to urinate less than every two hours? Less than 1 in 5 times     How often have you found you stopped and started again several times when you urinated? Not at All     How often have you found it difficult to postpone urination? Not at All     How often have you had a weak urinary stream? Not at All     How often have you had to strain to start  urination? Not at All     How many times did you typically get up at night to urinate? None     Total IPSS Score 1     Quality of Life due to urinary symptoms   If you were to spend the rest of your life with your urinary condition just the way it is now how would you feel about that? Pleased        Score:  1-7 Mild 8-19 Moderate 20-35 Severe   PMH: Past Medical History  Diagnosis Date  . HLD (hyperlipidemia)   . Paget disease of bone   . Adenocarcinoma of prostate ([place])   . BPH with obstruction/lower urinary tract symptoms     Surgical History: Past Surgical History  Procedure Laterality Date  . Cholecystectomy    . Prostate surgery    . Prostate biopsy      Home Medications:    Medication List       This list is accurate as of: 07/20/15 10:15 AM.  Always use your most recent med list.               aspirin 81 MG tablet  Take 81 mg by mouth daily.  cholecalciferol 1000 units tablet  Commonly known as:  VITAMIN D  Take 1,000 Units by mouth daily.     gabapentin 100 MG capsule  Commonly known as:  NEURONTIN  Take 100 mg by mouth 3 (three) times daily.     latanoprost 0.005 % ophthalmic solution  Commonly known as:  XALATAN  place 1 drop into both eyes at bedtime     lovastatin 40 MG tablet  Commonly known as:  MEVACOR  Take 40 mg by mouth at bedtime.     multivitamin tablet  Take 1 tablet by mouth daily.     omeprazole 40 MG capsule  Commonly known as:  PRILOSEC  Take 40 mg by mouth daily. Reported on 01/17/2015     PROSTATE HEALTH PO  Take by mouth.        Allergies: No Known Allergies  Family History: Family History  Problem Relation Age of Onset  . Cancer Son     melanoma  . Kidney disease Neg Hx   . Prostate cancer Neg Hx   . Breast cancer Sister   . Cancer Brother     Social History:  reports that he has quit smoking. He does not have any smokeless tobacco history on file. He reports that he does not drink alcohol or use  illicit drugs.  ROS: UROLOGY Frequent Urination?: No Hard to postpone urination?: No Burning/pain with urination?: No Get up at night to urinate?: No Leakage of urine?: No Urine stream starts and stops?: No Trouble starting stream?: No Do you have to strain to urinate?: No Blood in urine?: No Urinary tract infection?: No Sexually transmitted disease?: No Injury to kidneys or bladder?: No Painful intercourse?: No Weak stream?: No Erection problems?: No Penile pain?: No  Gastrointestinal Nausea?: No Vomiting?: No Indigestion/heartburn?: No Diarrhea?: No Constipation?: No  Constitutional Fever: No Night sweats?: Yes Weight loss?: No Fatigue?: No  Skin Skin rash/lesions?: No Itching?: No  Eyes Blurred vision?: No Double vision?: No  Ears/Nose/Throat Sore throat?: No Sinus problems?: No  Hematologic/Lymphatic Swollen glands?: No Easy bruising?: No  Cardiovascular Leg swelling?: No Chest pain?: No  Respiratory Cough?: No Shortness of breath?: No  Endocrine Excessive thirst?: No  Musculoskeletal Back pain?: No Joint pain?: No  Neurological Headaches?: No Dizziness?: No  Psychologic Depression?: No Anxiety?: No  Physical Exam: BP 133/65 mmHg  Pulse 56  Ht 5\' 7"  (1.702 m)  Wt 165 lb (74.844 kg)  BMI 25.84 kg/m2  Constitutional: Well nourished. Alert and oriented, No acute distress. HEENT: [place] AT, moist mucus membranes. Trachea midline, no masses. Cardiovascular: No clubbing, cyanosis, or edema. Respiratory: Normal respiratory effort, no increased work of breathing. Skin: No rashes, bruises or suspicious lesions. Lymph: No cervical or inguinal adenopathy. Neurologic: Grossly intact, no focal deficits, moving all 4 extremities. Psychiatric: Normal mood and affect.    Laboratory Data:  PSA History:  7.3 ng/mL on 00/00/2013  9.4 ng/mL on 08/00/2013  8.3 ng/mL on 02/00/2014 11.3 ng/mL on 09/00/2014- Patient was started on ADT  and Xgeva  2.2 ng/mL on 09/13/2013 13.3 ng/mL on 03/18/2014-Patient had discontinued ADT    2.0 ng/mL on 05/00/2016    1.2 ng/ml on 12/30/2014  Assessment & Plan:    1. Prostate cancer:  TURP in 2009 found inverted papilloma. TRUSPBx of prostate 11/2008 noted a Gleason's 6 (3+3) in one core with a PSA of 5.5 ng/mL and a prostate volume of 50cc.   He is still having severe hot flashes.  He would like  to discontinue the Lupron injections at this time.   I explained to him the role of Lupron with metastatic prostate cancer and the likelihood that the prostate cancer would continue to spread without ADT.   He understands this risk.  We will request most recent bone scan from Dr. De Hollingshead office.  He will RTC in 6 months for PSA and symptom recheck.    I spent 25 minutes in a face to face conversation with the patient concerning his hot flashes, the natural history of prostate cancer and the risks of discontinuing ADT.  He had his questions answered.  Greater than 50% was spent in counseling & coordination of care with the patient.   Return in about 6 months (around 01/20/2016) for PSA and office visit.  [name], [facility] [address] [place], [place] [address] [phone]

## 2015-07-21 LAB — PSA: PROSTATE SPECIFIC AG, SERUM: 0.6 ng/mL (ref 0.0–4.0)

## 2015-07-24 ENCOUNTER — Other Ambulatory Visit: Payer: Self-pay

## 2015-07-24 ENCOUNTER — Telehealth: Payer: Self-pay

## 2015-07-24 NOTE — Telephone Encounter (Signed)
No answer

## 2015-07-24 NOTE — Telephone Encounter (Signed)
-----   Message from Nori Riis, PA-C sent at 07/21/2015  8:17 AM EDT ----- Patient's PSA is stable.  We will see him in 6 months.

## 2015-07-26 NOTE — Telephone Encounter (Signed)
Tried to contact patient, no answer.

## 2015-07-27 ENCOUNTER — Telehealth: Payer: Self-pay | Admitting: *Deleted

## 2015-07-27 NOTE — Telephone Encounter (Signed)
LMOM

## 2015-07-27 NOTE — Telephone Encounter (Signed)
Patient returned the call and I advised him of his PSA results and his follow up appt in 6 months.

## 2015-08-08 DIAGNOSIS — E784 Other hyperlipidemia: Secondary | ICD-10-CM | POA: Diagnosis not present

## 2015-08-08 DIAGNOSIS — I10 Essential (primary) hypertension: Secondary | ICD-10-CM | POA: Diagnosis not present

## 2015-08-08 DIAGNOSIS — M5093 Cervical disc disorder, unspecified, cervicothoracic region: Secondary | ICD-10-CM | POA: Diagnosis not present

## 2015-08-08 DIAGNOSIS — R232 Flushing: Secondary | ICD-10-CM | POA: Diagnosis not present

## 2015-08-08 DIAGNOSIS — R5381 Other malaise: Secondary | ICD-10-CM | POA: Diagnosis not present

## 2015-08-10 DIAGNOSIS — M818 Other osteoporosis without current pathological fracture: Secondary | ICD-10-CM | POA: Diagnosis not present

## 2015-08-10 DIAGNOSIS — M5093 Cervical disc disorder, unspecified, cervicothoracic region: Secondary | ICD-10-CM | POA: Diagnosis not present

## 2015-08-10 DIAGNOSIS — R232 Flushing: Secondary | ICD-10-CM | POA: Diagnosis not present

## 2015-08-10 DIAGNOSIS — M76891 Other specified enthesopathies of right lower limb, excluding foot: Secondary | ICD-10-CM | POA: Diagnosis not present

## 2015-08-14 DIAGNOSIS — H40003 Preglaucoma, unspecified, bilateral: Secondary | ICD-10-CM | POA: Diagnosis not present

## 2015-08-22 DIAGNOSIS — H40001 Preglaucoma, unspecified, right eye: Secondary | ICD-10-CM | POA: Diagnosis not present

## 2015-08-24 DIAGNOSIS — C61 Malignant neoplasm of prostate: Secondary | ICD-10-CM | POA: Diagnosis not present

## 2015-08-24 DIAGNOSIS — E782 Mixed hyperlipidemia: Secondary | ICD-10-CM | POA: Diagnosis not present

## 2015-08-24 DIAGNOSIS — M8888 Osteitis deformans of other bones: Secondary | ICD-10-CM | POA: Diagnosis not present

## 2015-08-31 DIAGNOSIS — M8888 Osteitis deformans of other bones: Secondary | ICD-10-CM | POA: Diagnosis not present

## 2015-08-31 DIAGNOSIS — C61 Malignant neoplasm of prostate: Secondary | ICD-10-CM | POA: Diagnosis not present

## 2015-08-31 DIAGNOSIS — N400 Enlarged prostate without lower urinary tract symptoms: Secondary | ICD-10-CM | POA: Diagnosis not present

## 2015-08-31 DIAGNOSIS — M1990 Unspecified osteoarthritis, unspecified site: Secondary | ICD-10-CM | POA: Diagnosis not present

## 2015-08-31 DIAGNOSIS — M5136 Other intervertebral disc degeneration, lumbar region: Secondary | ICD-10-CM | POA: Diagnosis not present

## 2015-08-31 DIAGNOSIS — K219 Gastro-esophageal reflux disease without esophagitis: Secondary | ICD-10-CM | POA: Diagnosis not present

## 2015-08-31 DIAGNOSIS — E782 Mixed hyperlipidemia: Secondary | ICD-10-CM | POA: Diagnosis not present

## 2015-09-20 DIAGNOSIS — Z23 Encounter for immunization: Secondary | ICD-10-CM | POA: Diagnosis not present

## 2015-11-09 DIAGNOSIS — E784 Other hyperlipidemia: Secondary | ICD-10-CM | POA: Diagnosis not present

## 2015-11-09 DIAGNOSIS — M818 Other osteoporosis without current pathological fracture: Secondary | ICD-10-CM | POA: Diagnosis not present

## 2015-11-09 DIAGNOSIS — M4850XA Collapsed vertebra, not elsewhere classified, site unspecified, initial encounter for fracture: Secondary | ICD-10-CM | POA: Diagnosis not present

## 2015-11-09 DIAGNOSIS — M76891 Other specified enthesopathies of right lower limb, excluding foot: Secondary | ICD-10-CM | POA: Diagnosis not present

## 2015-12-12 ENCOUNTER — Encounter: Payer: Self-pay | Admitting: Urology

## 2016-01-19 ENCOUNTER — Ambulatory Visit: Payer: Medicare Other | Admitting: Urology

## 2016-01-21 NOTE — Progress Notes (Signed)
9:51 AM   BURNETT SISTARE Jul 15, 1928 [id number]  Referring provider: Cletis Athens, MD [address] [place], [place] [address]  Chief Complaint  Patient presents with  . Prostate Cancer    6 month follow up    HPI: Patient is an 83 year old AAM with metastatic prostate cancer who presents today for his 6 month follow up for prostate cancer.    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  Bone scan completed on 11/05/2012 noted 3 different areas of high suspicion for metastatic disease. 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 Patient is seeing Dr. Ronnald Collum and given Reclast.   He had a sheet of paper with him with his bone scan report.  It was interpreted as no metastasis or Paget's disease completed in 07/2015.    He was complaining of intense hot flashes at his last office visit, so he decided to discontinue his Lupron injections at that time.  The hot flashes are improved.  He denies any weight loss or appetite loss.   His IPSS score today is 3, which is mild lower urinary tract symptomatology. He is pleased with his quality life due to his urinary symptoms.  His previous IPSS score was 1/1.     His major complaints today are incomplete emptying, intermittency and a weak urinary stream.  He has had these symptoms for several years.  He denies any dysuria, hematuria or suprapubic pain.   He is having difficulty retracting his foreskin at times.    He also denies any recent fevers, chills, nausea or vomiting.      IPSS    Row Name 01/22/16 0900         International Prostate Symptom Score   How often have you had the sensation of not emptying your bladder? Less than 1 in 5     How often have you had to urinate less than every two hours? Not at All     How often  have you found you stopped and started again several times when you urinated? Less than 1 in 5 times     How often have you found it difficult to postpone urination? Not at All     How often have you had a weak urinary stream? Less than 1 in 5 times     How often have you had to strain to start urination? Not at All     How many times did you typically get up at night to urinate? None     Total IPSS Score 3       Quality of Life due to urinary symptoms   If you were to spend the rest of your life with your urinary condition just the way it is now how would you feel about that? Pleased        Score:  1-7 Mild 8-19 Moderate 20-35 Severe    PMH: Past Medical History:  Diagnosis Date  . Adenocarcinoma of prostate ([place])   . BPH with obstruction/lower urinary tract symptoms   . HLD (hyperlipidemia)   . Paget disease of bone     Surgical History: Past Surgical History:  Procedure Laterality Date  . CHOLECYSTECTOMY    . PROSTATE BIOPSY    . PROSTATE SURGERY  Home Medications:  Allergies as of 01/22/2016   No Known Allergies     Medication List       Accurate as of 01/22/16  9:51 AM. Always use your most recent med list.          aspirin 81 MG tablet Take 81 mg by mouth daily.   calcium & magnesium carbonates 311-232 MG tablet Commonly known as:  MYLANTA Take by mouth.   cholecalciferol 1000 units tablet Commonly known as:  VITAMIN D Take 1,000 Units by mouth daily.   gabapentin 100 MG capsule Commonly known as:  NEURONTIN Take 100 mg by mouth 3 (three) times daily.   latanoprost 0.005 % ophthalmic solution Commonly known as:  XALATAN place 1 drop into both eyes at bedtime   lovastatin 40 MG tablet Commonly known as:  MEVACOR Take 40 mg by mouth at bedtime.   multivitamin tablet Take 1 tablet by mouth daily.   omeprazole 40 MG capsule Commonly known as:  PRILOSEC Take 40 mg by mouth daily. Reported on 01/17/2015   PROSTATE HEALTH PO Take by  mouth.       Allergies: No Known Allergies  Family History: Family History  Problem Relation Age of Onset  . Cancer Son     melanoma  . Breast cancer Sister   . Cancer Brother   . Kidney disease Neg Hx   . Prostate cancer Neg Hx     Social History:  reports that he has quit smoking. He has never used smokeless tobacco. He reports that he does not drink alcohol or use drugs.  ROS: UROLOGY Frequent Urination?: No Hard to postpone urination?: No Burning/pain with urination?: No Get up at night to urinate?: No Leakage of urine?: No Urine stream starts and stops?: No Trouble starting stream?: No Do you have to strain to urinate?: No Blood in urine?: No Urinary tract infection?: No Sexually transmitted disease?: No Injury to kidneys or bladder?: No Painful intercourse?: No Weak stream?: No Erection problems?: No Penile pain?: No  Gastrointestinal Nausea?: No Vomiting?: No Indigestion/heartburn?: No Diarrhea?: No Constipation?: No  Constitutional Fever: No Night sweats?: Yes Weight loss?: No Fatigue?: No  Skin Skin rash/lesions?: No Itching?: No  Eyes Blurred vision?: No Double vision?: No  Ears/Nose/Throat Sore throat?: No Sinus problems?: Yes  Hematologic/Lymphatic Swollen glands?: No Easy bruising?: No  Cardiovascular Leg swelling?: No Chest pain?: No  Respiratory Cough?: No Shortness of breath?: No  Endocrine Excessive thirst?: No  Musculoskeletal Back pain?: No Joint pain?: Yes  Neurological Headaches?: No Dizziness?: No  Psychologic Depression?: No Anxiety?: No  Physical Exam: BP 117/67   Pulse 64   Ht 5\' 6"  (1.676 m)   Wt 167 lb 6.4 oz (75.9 kg)   BMI 27.02 kg/m   Constitutional: Well nourished. Alert and oriented, No acute distress. HEENT: [place] AT, moist mucus membranes. Trachea midline, no masses. Cardiovascular: No clubbing, cyanosis, or edema. Respiratory: Normal respiratory effort, no increased work of  breathing. GI: Abdomen is soft, non tender, non distended, no abdominal masses. Liver and spleen not palpable.  No hernias appreciated.  Stool sample for occult testing is not indicated.   GU: No CVA tenderness.  No bladder fullness or masses.  Patient with uncircumcised phallus. Balanitis present.  Foreskin easily retracted   Urethral meatus is patent.  No penile discharge. No penile lesions or rashes. Scrotum without lesions, cysts, rashes and/or edema.  Testicles are located scrotally bilaterally. No masses are appreciated in the testicles. Left and right  epididymis are normal. Rectal: Patient with  normal sphincter tone. Anus and perineum without scarring or rashes. No rectal masses are appreciated. Prostate is approximately 60 grams, no nodules are appreciated. Seminal vesicles are normal. Skin: No rashes, bruises or suspicious lesions. Lymph: No cervical or inguinal adenopathy. Neurologic: Grossly intact, no focal deficits, moving all 4 extremities. Psychiatric: Normal mood and affect.   Laboratory Data: PSA History:  7.3 ng/mL on 00/00/2013  9.4 ng/mL on 08/00/2013  8.3 ng/mL on 02/00/2014 11.3 ng/mL on 09/00/2014- Patient was started on ADT and Xgeva  2.2 ng/mL on 09/13/2013 13.3 ng/mL on 03/18/2014-Patient had discontinued ADT    2.0 ng/mL on 05/00/2016    1.2 ng/ml on 12/30/2014     0.6 ng/mL in 07/2015  Assessment & Plan:    1. Prostate cancer:    - TURP in 2009 found inverted papilloma. TRUSPBx of prostate 11/2008 noted a Gleason's 6 (3+3) in one core with a PSA of 5.5 ng/mL and a prostate volume of 50 cc    - Patient would like to resume his ADT-   - He will RTC in 6 months for PSA, I PSS, Lupron shot and exam.     2. Balanitis  - Mycolog cream bid  Return in about 6 months (around 07/21/2016) for IPSS, Lupron shot, PSA and exam.  [name], [facility]  [place] [address] [place],  [address] [phone]

## 2016-01-22 ENCOUNTER — Ambulatory Visit (INDEPENDENT_AMBULATORY_CARE_PROVIDER_SITE_OTHER): Payer: Medicare Other | Admitting: Urology

## 2016-01-22 ENCOUNTER — Encounter: Payer: Self-pay | Admitting: Urology

## 2016-01-22 VITALS — BP 117/67 | HR 64 | Ht 66.0 in | Wt 167.4 lb

## 2016-01-22 DIAGNOSIS — Z8546 Personal history of malignant neoplasm of prostate: Secondary | ICD-10-CM | POA: Diagnosis not present

## 2016-01-22 DIAGNOSIS — N481 Balanitis: Secondary | ICD-10-CM | POA: Diagnosis not present

## 2016-01-22 DIAGNOSIS — C61 Malignant neoplasm of prostate: Secondary | ICD-10-CM | POA: Diagnosis not present

## 2016-01-22 MED ORDER — LEUPROLIDE ACETATE (6 MONTH) 45 MG IM KIT
45.0000 mg | PACK | Freq: Once | INTRAMUSCULAR | Status: AC
  Administered 2016-01-22: 45 mg via INTRAMUSCULAR

## 2016-01-22 MED ORDER — NYSTATIN-TRIAMCINOLONE 100000-0.1 UNIT/GM-% EX OINT: 1.0000 "application " | TOPICAL_OINTMENT | Freq: Two times a day (BID) | CUTANEOUS | 0 refills | Status: DC

## 2016-01-22 NOTE — Progress Notes (Signed)
Lupron IM Injection   Due to Prostate Cancer patient is present today for a Lupron Injection.  Medication: Lupron 6 month Dose: 45 mg  Location: left upper outer buttocks Lot: [id number] Exp: 11/23/2016  Patient tolerated well, no complications were noted  Performed by: Lyndee Hensen CMA  Follow up: 6 months plus 7 days for insurance for Lupron

## 2016-01-23 LAB — PSA: PROSTATE SPECIFIC AG, SERUM: 8.7 ng/mL — AB (ref 0.0–4.0)

## 2016-01-24 ENCOUNTER — Telehealth: Payer: Self-pay | Admitting: *Deleted

## 2016-01-24 DIAGNOSIS — Z8546 Personal history of malignant neoplasm of prostate: Secondary | ICD-10-CM

## 2016-01-24 NOTE — Telephone Encounter (Signed)
Spoke with patient and gave results. Patient ok with the plan. Transferred to [place] to make lab appointment for one month for PSA. Order placed.

## 2016-01-24 NOTE — Telephone Encounter (Signed)
-----   Message from Nori Riis, PA-C sent at 01/24/2016  7:33 AM EST ----- Please notify the patient that his PSA has increased which is not surprising since he did not receive a Lupron shot at his visit a few months ago.  He did receive a Lupron shot on Monday, so we should see the PSA decrease.  I suggest that we repeat the PSA in one month.

## 2016-02-20 DIAGNOSIS — H40003 Preglaucoma, unspecified, bilateral: Secondary | ICD-10-CM | POA: Diagnosis not present

## 2016-02-26 ENCOUNTER — Other Ambulatory Visit: Payer: Medicare Other

## 2016-02-26 DIAGNOSIS — Z8546 Personal history of malignant neoplasm of prostate: Secondary | ICD-10-CM

## 2016-02-27 ENCOUNTER — Telehealth: Payer: Self-pay

## 2016-02-27 LAB — PSA: PROSTATE SPECIFIC AG, SERUM: 13.4 ng/mL — AB (ref 0.0–4.0)

## 2016-02-27 NOTE — Telephone Encounter (Signed)
Spoke with pt in reference to rising PSA and needing a nuclear bone scan. Pt voiced understanding stating he would call Dr. Allie Bossier office today.

## 2016-02-27 NOTE — Telephone Encounter (Signed)
-----   Message from Nori Riis, PA-C sent at 02/27/2016  8:19 AM EST ----- Please notify the patient that his PSA is rising.  I would have him contact Dr. De Hollingshead office and see if they would do a nuclear bone scan to evaluate for bone mets.

## 2016-02-27 NOTE — Telephone Encounter (Signed)
LMOM

## 2016-02-29 ENCOUNTER — Other Ambulatory Visit: Payer: Self-pay | Admitting: Internal Medicine

## 2016-02-29 ENCOUNTER — Ambulatory Visit
Admission: RE | Admit: 2016-02-29 | Discharge: 2016-02-29 | Disposition: A | Discharge status: Home / Self Care | Payer: Medicare Other | Source: Ambulatory Visit | Attending: Internal Medicine | Admitting: Internal Medicine

## 2016-02-29 DIAGNOSIS — J9811 Atelectasis: Secondary | ICD-10-CM | POA: Diagnosis not present

## 2016-02-29 DIAGNOSIS — R042 Hemoptysis: Secondary | ICD-10-CM | POA: Insufficient documentation

## 2016-02-29 DIAGNOSIS — R918 Other nonspecific abnormal finding of lung field: Secondary | ICD-10-CM | POA: Insufficient documentation

## 2016-02-29 DIAGNOSIS — M76891 Other specified enthesopathies of right lower limb, excluding foot: Secondary | ICD-10-CM | POA: Diagnosis not present

## 2016-02-29 DIAGNOSIS — M4850XA Collapsed vertebra, not elsewhere classified, site unspecified, initial encounter for fracture: Secondary | ICD-10-CM | POA: Diagnosis not present

## 2016-02-29 DIAGNOSIS — M889 Osteitis deformans of unspecified bone: Secondary | ICD-10-CM | POA: Diagnosis not present

## 2016-02-29 DIAGNOSIS — I700 Atherosclerosis of aorta: Secondary | ICD-10-CM | POA: Insufficient documentation

## 2016-03-04 ENCOUNTER — Telehealth: Payer: Self-pay

## 2016-03-04 NOTE — Telephone Encounter (Signed)
-----   Message from Nori Riis, PA-C sent at 03/03/2016 10:31 AM EST ----- Has patient contacted Dr. De Hollingshead office to see if they can do the bone scan?

## 2016-03-04 NOTE — Telephone Encounter (Signed)
Spoke with Tiffany at Dr. De Hollingshead office. Tiffany stated that I would need to speak with Juliann Pulse, who is out of the office today. Will try [facility] tomorrow morning.

## 2016-03-04 NOTE — Telephone Encounter (Signed)
Spoke with pt in reference to bone scan. Pt stated that he called Dr. Darci Current office and the nurse told him he needed to call Dr. Lavera Guise, pt PCP.

## 2016-03-04 NOTE — Telephone Encounter (Signed)
LMOM

## 2016-03-04 NOTE — Telephone Encounter (Signed)
He does not need to contact his PCP to schedule a bone scan.  Would you please call Dr. De Hollingshead office and explain to them what we are needing?

## 2016-03-07 ENCOUNTER — Other Ambulatory Visit: Payer: Self-pay | Admitting: Internal Medicine

## 2016-03-07 DIAGNOSIS — R042 Hemoptysis: Secondary | ICD-10-CM

## 2016-03-07 DIAGNOSIS — M4850XA Collapsed vertebra, not elsewhere classified, site unspecified, initial encounter for fracture: Secondary | ICD-10-CM | POA: Diagnosis not present

## 2016-03-07 DIAGNOSIS — R634 Abnormal weight loss: Secondary | ICD-10-CM

## 2016-03-07 DIAGNOSIS — M889 Osteitis deformans of unspecified bone: Secondary | ICD-10-CM | POA: Diagnosis not present

## 2016-03-07 NOTE — Telephone Encounter (Signed)
Spoke with Juliann Pulse at Dr. De Hollingshead office and made aware of pt situation. Juliann Pulse stated that she would be glad to do the bone scan. Juliann Pulse stated it will be Monday before scan could be done. Made Juliann Pulse aware that would be ok. Juliann Pulse stated that she would fax orders over once she speaks with pt.

## 2016-03-11 DIAGNOSIS — M8888 Osteitis deformans of other bones: Secondary | ICD-10-CM | POA: Diagnosis not present

## 2016-03-11 DIAGNOSIS — M1990 Unspecified osteoarthritis, unspecified site: Secondary | ICD-10-CM | POA: Diagnosis not present

## 2016-03-11 DIAGNOSIS — E782 Mixed hyperlipidemia: Secondary | ICD-10-CM | POA: Diagnosis not present

## 2016-03-11 DIAGNOSIS — M5136 Other intervertebral disc degeneration, lumbar region: Secondary | ICD-10-CM | POA: Diagnosis not present

## 2016-03-11 DIAGNOSIS — C61 Malignant neoplasm of prostate: Secondary | ICD-10-CM | POA: Diagnosis not present

## 2016-03-11 DIAGNOSIS — N400 Enlarged prostate without lower urinary tract symptoms: Secondary | ICD-10-CM | POA: Diagnosis not present

## 2016-03-11 DIAGNOSIS — K219 Gastro-esophageal reflux disease without esophagitis: Secondary | ICD-10-CM | POA: Diagnosis not present

## 2016-03-13 ENCOUNTER — Other Ambulatory Visit: Payer: Self-pay

## 2016-03-14 ENCOUNTER — Ambulatory Visit: Admission: RE | Admit: 2016-03-14 | Payer: Medicare Other | Source: Ambulatory Visit

## 2016-03-25 ENCOUNTER — Telehealth: Payer: Self-pay

## 2016-03-25 ENCOUNTER — Other Ambulatory Visit: Payer: Self-pay | Admitting: Urology

## 2016-03-25 DIAGNOSIS — C61 Malignant neoplasm of prostate: Secondary | ICD-10-CM

## 2016-03-25 NOTE — Telephone Encounter (Signed)
-----   Message from Nori Riis, PA-C sent at 03/22/2016  3:31 PM EST ----- Please notify the patient that his bone scan does identify spread to his bones.  I suggest he have an appointment with the cancer center for further treatment options.

## 2016-03-25 NOTE — Telephone Encounter (Signed)
LMOM

## 2016-03-25 NOTE — Telephone Encounter (Signed)
I have spoken with the patient and he would like a referral to the [place].  I will place the referral.

## 2016-04-05 DIAGNOSIS — M889 Osteitis deformans of unspecified bone: Secondary | ICD-10-CM | POA: Diagnosis not present

## 2016-04-05 DIAGNOSIS — M4850XA Collapsed vertebra, not elsewhere classified, site unspecified, initial encounter for fracture: Secondary | ICD-10-CM | POA: Diagnosis not present

## 2016-04-05 DIAGNOSIS — R042 Hemoptysis: Secondary | ICD-10-CM | POA: Diagnosis not present

## 2016-04-11 ENCOUNTER — Inpatient Hospital Stay: Payer: Medicare Other | Attending: Oncology | Admitting: Oncology

## 2016-04-11 ENCOUNTER — Encounter: Payer: Self-pay | Admitting: Oncology

## 2016-04-11 ENCOUNTER — Inpatient Hospital Stay: Payer: Medicare Other

## 2016-04-11 VITALS — BP 132/85 | HR 68 | Temp 93.3°F | Resp 18 | Wt 169.8 lb

## 2016-04-11 DIAGNOSIS — Z7982 Long term (current) use of aspirin: Secondary | ICD-10-CM | POA: Insufficient documentation

## 2016-04-11 DIAGNOSIS — C61 Malignant neoplasm of prostate: Secondary | ICD-10-CM | POA: Diagnosis not present

## 2016-04-11 DIAGNOSIS — Z87891 Personal history of nicotine dependence: Secondary | ICD-10-CM | POA: Insufficient documentation

## 2016-04-11 DIAGNOSIS — C7951 Secondary malignant neoplasm of bone: Secondary | ICD-10-CM | POA: Diagnosis not present

## 2016-04-11 DIAGNOSIS — E785 Hyperlipidemia, unspecified: Secondary | ICD-10-CM | POA: Diagnosis not present

## 2016-04-11 LAB — CBC WITH DIFFERENTIAL/PLATELET
Basophils Absolute: 0.1 10*3/uL (ref 0–0.1)
Basophils Relative: 1 %
EOS ABS: 0.3 10*3/uL (ref 0–0.7)
EOS PCT: 5 %
HCT: 47.5 % (ref 40.0–52.0)
Hemoglobin: 15.9 g/dL (ref 13.0–18.0)
LYMPHS ABS: 2.3 10*3/uL (ref 1.0–3.6)
Lymphocytes Relative: 38 %
MCH: 30.1 pg (ref 26.0–34.0)
MCHC: 33.5 g/dL (ref 32.0–36.0)
MCV: 90 fL (ref 80.0–100.0)
Monocytes Absolute: 0.6 10*3/uL (ref 0.2–1.0)
Monocytes Relative: 11 %
Neutro Abs: 2.7 10*3/uL (ref 1.4–6.5)
Neutrophils Relative %: 45 %
PLATELETS: 209 10*3/uL (ref 150–440)
RBC: 5.27 MIL/uL (ref 4.40–5.90)
RDW: 13.4 % (ref 11.5–14.5)
WBC: 6 10*3/uL (ref 3.8–10.6)

## 2016-04-11 LAB — COMPREHENSIVE METABOLIC PANEL
ALK PHOS: 75 U/L (ref 38–126)
ALT: 23 U/L (ref 17–63)
ANION GAP: 7 (ref 5–15)
AST: 30 U/L (ref 15–41)
Albumin: 4.5 g/dL (ref 3.5–5.0)
BUN: 14 mg/dL (ref 6–20)
CALCIUM: 10 mg/dL (ref 8.9–10.3)
CHLORIDE: 102 mmol/L (ref 101–111)
CO2: 27 mmol/L (ref 22–32)
Creatinine, Ser: 0.89 mg/dL (ref 0.61–1.24)
Glucose, Bld: 94 mg/dL (ref 65–99)
Potassium: 4.8 mmol/L (ref 3.5–5.1)
SODIUM: 136 mmol/L (ref 135–145)
Total Bilirubin: 0.8 mg/dL (ref 0.3–1.2)
Total Protein: 7.7 g/dL (ref 6.5–8.1)

## 2016-04-11 LAB — PSA: PSA: 1.95 ng/mL (ref 0.00–4.00)

## 2016-04-11 NOTE — Progress Notes (Signed)
Hematology/Oncology Consult note [facility]  [phone] [phone]  Patient Care Team: Cletis Athens, MD as PCP - General (Cardiology)   Name of the patient: Dean White  [id number]  1928/12/01   Date of visit: 04/11/16  Diagnosis- metastatic prostate cancer with bone metastases  Chief complaint/ Reason for visit- reestablish follow-up  Heme/Onc history: Patient is a 83 year old male with a past medical history significant for metastatic prostate cancer with bone metastases and was last seen by Dr. Ma Hillock in November 2014. In November 2010 he had a prostate biopsy when his PSA was going up to 5.5 and pathology showed adenocarcinoma Gleason score 3+3. Patient reports didn't elect didn't for active surveillance. Bone scan in October 2014 showed 3 areas of suspicious metastatic disease. Patient also has Paget's disease of the bone and has received Reclast from Dr.Morayati in the past. Patient had been receiving Lupron shots with urology every 6 months and his last dose was in January 2018. PSA increased to 8.7 in January 2018 from a value of 0.68 months ago. In February for the increased 13.4. Patient had a repeat bone scan in February 2018 which showed increased activity in the midthoracic and lower lumbar spine as well as the right shoulder and other joints of visit this and in the right ilium and left ischium which was compared to the bone scan from last year and was concerning for recurrent bony metastases. 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 He does report hot flashes secondary to Lupron but denies other complaints.   ECOG PS- 1 Pain scale- 0 Opioid associated constipation- no  Review of systems- Review of Systems  Constitutional: Negative for chills, fever,  malaise/fatigue and weight loss.  HENT: Negative for congestion, ear discharge and nosebleeds.   Eyes: Negative for blurred vision.  Respiratory: Negative for cough, hemoptysis, sputum production, shortness of breath and wheezing.   Cardiovascular: Negative for chest pain, palpitations, orthopnea and claudication.  Gastrointestinal: Negative for abdominal pain, blood in stool, constipation, diarrhea, heartburn, melena, nausea and vomiting.  Genitourinary: Negative for dysuria, flank pain, frequency, hematuria and urgency.  Musculoskeletal: Negative for back pain, joint pain and myalgias.  Skin: Negative for rash.  Neurological: Negative for dizziness, tingling, focal weakness, seizures, weakness and headaches.  Endo/Heme/Allergies: Does not bruise/bleed easily.  Psychiatric/Behavioral: Negative for depression and suicidal ideas. The patient does not have insomnia.      Current treatment- lupron  No Known Allergies   Past Medical History:  Diagnosis Date  . Adenocarcinoma of prostate ([place])   . BPH with obstruction/lower urinary tract symptoms   . HLD (hyperlipidemia)   . Paget disease of bone      Past Surgical History:  Procedure Laterality Date  . CHOLECYSTECTOMY    . PROSTATE BIOPSY    . PROSTATE SURGERY      Social History   Social History  . Marital status: Married    Spouse name: N/A  . Number of children: N/A  . Years of education: N/A   Occupational History  . Not on file.   Social History Main Topics  . Smoking status: Former Research scientist (life sciences)  . Smokeless tobacco: Never Used     Comment: quit 50 years ago  . Alcohol use No  . Drug use: No  . Sexual activity: Not on  file   Other Topics Concern  . Not on file   Social History Narrative  . No narrative on file    Family History  Problem Relation Age of Onset  . Cancer Son     melanoma  . Breast cancer Sister   . Cancer Brother   . Kidney disease Neg Hx   . Prostate cancer Neg Hx      Current  Outpatient Prescriptions:  .  aspirin 81 MG tablet, Take 81 mg by mouth daily., Disp: , Rfl:  .  calcium & magnesium carbonates (MYLANTA) 311-232 MG tablet, Take by mouth., Disp: , Rfl:  .  cholecalciferol (VITAMIN D) 1000 UNITS tablet, Take 1,000 Units by mouth daily., Disp: , Rfl:  .  latanoprost (XALATAN) 0.005 % ophthalmic solution, place 1 drop into both eyes at bedtime, Disp: , Rfl: 0 .  lovastatin (MEVACOR) 40 MG tablet, Take 40 mg by mouth at bedtime., Disp: , Rfl:  .  Misc Natural Products (PROSTATE HEALTH PO), Take by mouth., Disp: , Rfl:  .  Multiple Vitamin (MULTIVITAMIN) tablet, Take 1 tablet by mouth daily., Disp: , Rfl:  .  nystatin-triamcinolone ointment (MYCOLOG), Apply 1 application topically 2 (two) times daily., Disp: 30 g, Rfl: 0 .  omeprazole (PRILOSEC) 40 MG capsule, Take 40 mg by mouth daily. Reported on 01/17/2015, Disp: , Rfl: 0 .  gabapentin (NEURONTIN) 100 MG capsule, Take 100 mg by mouth 3 (three) times daily., Disp: , Rfl:   Physical exam:  Vitals:   04/11/16 1422  BP: 132/85  Pulse: 68  Resp: 18  Temp: (!) 93.3 F (34.1 C)  TempSrc: Tympanic  Weight: 169 lb 12.8 oz (77 kg)   Physical Exam  Constitutional: He is oriented to person, place, and time and well-developed, well-nourished, and in no distress.  HENT:  Head: Normocephalic and atraumatic.  Eyes: EOM are normal. Pupils are equal, round, and reactive to light.  Neck: Normal range of motion.  Cardiovascular: Normal rate, regular rhythm and normal heart sounds.   Pulmonary/Chest: Effort normal and breath sounds normal.  Abdominal: Soft. Bowel sounds are normal.  Neurological: He is alert and oriented to person, place, and time.  Skin: Skin is warm and dry.     CMP Latest Ref Rng & Units 04/11/2016  Glucose 65 - 99 mg/dL 94  BUN 6 - 20 mg/dL 14  Creatinine 0.61 - 1.24 mg/dL 0.89  Sodium 135 - 145 mmol/L 136  Potassium 3.5 - 5.1 mmol/L 4.8  Chloride 101 - 111 mmol/L 102  CO2 22 - 32 mmol/L 27    Calcium 8.9 - 10.3 mg/dL 10.0  Total Protein 6.5 - 8.1 g/dL 7.7  Total Bilirubin 0.3 - 1.2 mg/dL 0.8  Alkaline Phos 38 - 126 U/L 75  AST 15 - 41 U/L 30  ALT 17 - 63 U/L 23   CBC Latest Ref Rng & Units 04/11/2016  WBC 3.8 - 10.6 K/uL 6.0  Hemoglobin 13.0 - 18.0 g/dL 15.9  Hematocrit 40.0 - 52.0 % 47.5  Platelets 150 - 440 K/uL 209     Assessment and plan- Patient is a 83 y.o. 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 Also his recent bone scan shows new lesions throughout his spine which is new as compared to 2017. At this time I will proceed with a  complete metastatic workup with CT thorax as well as CT abdomen and obtain baseline labs including CBC, CMP, PSA and serum free testosterone. I will also review his case at the tumor board next week and have his prior bone scans to be reviewed by our radiologists area if he has castrate resistant disease there are various treatment options available. Patient is not interested in palliative chemotherapy if that were to be an option. I did discuss that other forms of antiandrogen therapy such as abiraterone or enzalutamide can be tried. I have given him written information about abiraterone and he would like to think about his options. I will see him back in about 10 days' time after his scans are back to discuss this in further detail   Visit Diagnosis 1. Malignant neoplasm of prostate [facility])      Dr. Randa Evens, MD, MPH [facility] Pager- [phone] 04/11/2016 4:11 PM

## 2016-04-11 NOTE — Progress Notes (Signed)
Per pt referred back by Dr Larene Beach / urologist

## 2016-04-11 NOTE — Addendum Note (Signed)
Addended by: Vernetta Honey on: 04/11/2016 04:38 PM   Modules accepted: Orders

## 2016-04-12 LAB — TESTOSTERONE, FREE: TESTOSTERONE FREE: 1 pg/mL — AB (ref 6.6–18.1)

## 2016-04-18 ENCOUNTER — Inpatient Hospital Stay: Payer: Medicare Other | Attending: Oncology

## 2016-04-18 ENCOUNTER — Ambulatory Visit
Admission: RE | Admit: 2016-04-18 | Discharge: 2016-04-18 | Disposition: A | Discharge status: Home / Self Care | Payer: Medicare Other | Source: Ambulatory Visit | Attending: Oncology | Admitting: Oncology

## 2016-04-18 DIAGNOSIS — C7951 Secondary malignant neoplasm of bone: Secondary | ICD-10-CM | POA: Diagnosis not present

## 2016-04-18 DIAGNOSIS — N138 Other obstructive and reflux uropathy: Secondary | ICD-10-CM | POA: Insufficient documentation

## 2016-04-18 DIAGNOSIS — R918 Other nonspecific abnormal finding of lung field: Secondary | ICD-10-CM | POA: Insufficient documentation

## 2016-04-18 DIAGNOSIS — R911 Solitary pulmonary nodule: Secondary | ICD-10-CM | POA: Diagnosis not present

## 2016-04-18 DIAGNOSIS — I2510 Atherosclerotic heart disease of native coronary artery without angina pectoris: Secondary | ICD-10-CM | POA: Insufficient documentation

## 2016-04-18 DIAGNOSIS — C61 Malignant neoplasm of prostate: Secondary | ICD-10-CM | POA: Insufficient documentation

## 2016-04-18 DIAGNOSIS — K219 Gastro-esophageal reflux disease without esophagitis: Secondary | ICD-10-CM | POA: Diagnosis not present

## 2016-04-18 DIAGNOSIS — Z87891 Personal history of nicotine dependence: Secondary | ICD-10-CM | POA: Insufficient documentation

## 2016-04-18 DIAGNOSIS — M889 Osteitis deformans of unspecified bone: Secondary | ICD-10-CM | POA: Insufficient documentation

## 2016-04-18 DIAGNOSIS — E785 Hyperlipidemia, unspecified: Secondary | ICD-10-CM | POA: Insufficient documentation

## 2016-04-18 DIAGNOSIS — E042 Nontoxic multinodular goiter: Secondary | ICD-10-CM | POA: Diagnosis not present

## 2016-04-18 DIAGNOSIS — N401 Enlarged prostate with lower urinary tract symptoms: Secondary | ICD-10-CM | POA: Insufficient documentation

## 2016-04-18 DIAGNOSIS — Z79899 Other long term (current) drug therapy: Secondary | ICD-10-CM | POA: Insufficient documentation

## 2016-04-18 MED ORDER — IOPAMIDOL (ISOVUE-300) INJECTION 61%
100.0000 mL | Freq: Once | INTRAVENOUS | Status: AC | PRN
  Administered 2016-04-18: 100 mL via INTRAVENOUS

## 2016-04-25 ENCOUNTER — Inpatient Hospital Stay (HOSPITAL_BASED_OUTPATIENT_CLINIC_OR_DEPARTMENT_OTHER): Payer: Medicare Other | Admitting: Oncology

## 2016-04-25 VITALS — BP 137/70 | HR 77 | Temp 97.3°F | Resp 18 | Wt 169.5 lb

## 2016-04-25 DIAGNOSIS — R918 Other nonspecific abnormal finding of lung field: Secondary | ICD-10-CM | POA: Diagnosis not present

## 2016-04-25 DIAGNOSIS — Z79899 Other long term (current) drug therapy: Secondary | ICD-10-CM | POA: Diagnosis not present

## 2016-04-25 DIAGNOSIS — N401 Enlarged prostate with lower urinary tract symptoms: Secondary | ICD-10-CM | POA: Diagnosis not present

## 2016-04-25 DIAGNOSIS — C61 Malignant neoplasm of prostate: Secondary | ICD-10-CM

## 2016-04-25 DIAGNOSIS — N138 Other obstructive and reflux uropathy: Secondary | ICD-10-CM | POA: Diagnosis not present

## 2016-04-25 DIAGNOSIS — Z87891 Personal history of nicotine dependence: Secondary | ICD-10-CM | POA: Diagnosis not present

## 2016-04-25 DIAGNOSIS — C7951 Secondary malignant neoplasm of bone: Secondary | ICD-10-CM | POA: Diagnosis not present

## 2016-04-25 DIAGNOSIS — M889 Osteitis deformans of unspecified bone: Secondary | ICD-10-CM | POA: Diagnosis not present

## 2016-04-25 DIAGNOSIS — E785 Hyperlipidemia, unspecified: Secondary | ICD-10-CM | POA: Diagnosis not present

## 2016-04-25 MED ORDER — CITALOPRAM HYDROBROMIDE 10 MG PO TABS: 10.0000 mg | ORAL_TABLET | Freq: Every day | ORAL | 1 refills | Status: DC

## 2016-04-25 NOTE — Progress Notes (Signed)
Patient offers no complaints today.  Patient here today for MRI results.

## 2016-04-26 ENCOUNTER — Encounter: Payer: Self-pay | Admitting: Urology

## 2016-04-26 ENCOUNTER — Encounter: Payer: Self-pay | Admitting: Oncology

## 2016-04-26 NOTE — Progress Notes (Signed)
Hematology/Oncology Consult note [facility]  Telephone:([phone] [phone]  Patient Care Team: Cletis Athens, MD as PCP - General (Cardiology)   Name of the patient: Dean White  [id number]  [birth year]   Date of visit: 04/26/16  Diagnosis- metastatic prostate cancer with bone mets  Chief complaint/ Reason for visit- discuss results of CT scan  Heme/Onc history: Patient is a 83 year old male with a past medical history significant for metastatic prostate cancer with bone metastases and was last seen by Dr. Ma Hillock in November 2014. In November 2010 he had a prostate biopsy when his PSA was going up to 5.5 and pathology showed adenocarcinoma Gleason score 3+3. Bone scan in October 2014 showed 3 areas of suspicious metastatic disease. Patient also has Paget's disease of the bone and has received Reclast from Dr.Morayati in the past. Patient had been receiving Lupron shots with urology every 6 months and his last dose was in January 2018. PSA increased to 8.7 in January 2018 from a value of 0.68 months ago. In February for the increased 13.4. Patient had a repeat bone scan in February 2018 which showed increased activity in the midthoracic and lower lumbar spine as well as the right shoulder and other joints of visit this and in the right ilium and left ischium which was compared to the bone scan from last year and was concerning for recurrent bony metastases. 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 Nodular opacities in the left upper lobe, largest measuring 7 mm. The small nodular lesions potentially could represent metastatic foci. No larger lesions evident. No adenopathy. There is multinodular goiter without dominant thyroid mass. There are scattered foci of atherosclerotic calcification including  scattered foci of coronary artery calcification. There is a degree of spontaneous gastroesophageal reflux.  CT abdomen and pelvis: Bony metastatic disease, sclerotic in character, throughout the pelvis. Suspect combination of bony metastatic disease and Paget's disease in the proximal right femur.  No adenopathy.  No focal liver lesions beyond hepatic steatosis.  No bowel wall thickening or bowel obstruction. No abscess. Appendix appears normal. No renal or ureteral calculus. No hydronephrosis.  Trend of his PSA is as follows:   Component     Latest Ref Rng & Units 10/06/2014 07/20/2015 01/22/2016 02/26/2016  PSA     0.00 - 4.00 ng/mL 1.4 0.6 8.7 (H) 13.4 (H)   Component     Latest Ref Rng & Units 04/11/2016  PSA     0.00 - 4.00 ng/mL 1.95   Interval history- still has problems with hot flashes. Reports occasional low back pain. Remains active and is independent of his ADL's and IADL's. Able to mow his lawn etc  ECOG PS- 0 Pain scale- 0 Opioid associated constipation- n/a  Review of systems- Review of Systems  Constitutional: Negative for chills, fever, malaise/fatigue and weight loss.  HENT: Negative for congestion, ear discharge and nosebleeds.   Eyes: Negative for blurred vision.  Respiratory: Negative for cough, hemoptysis, sputum production, shortness of breath and wheezing.   Cardiovascular: Negative for chest pain, palpitations, orthopnea and claudication.  Gastrointestinal: Negative for abdominal pain, blood in stool, constipation, diarrhea, heartburn, melena, nausea and vomiting.  Genitourinary: Negative for dysuria, flank pain, frequency, hematuria and urgency.  Musculoskeletal: Negative for back pain, joint pain and myalgias.  Skin: Negative for rash.  Neurological: Negative for dizziness, tingling, focal weakness, seizures,  weakness and headaches.  Endo/Heme/Allergies: Does not bruise/bleed easily.  Psychiatric/Behavioral: Negative for depression and suicidal  ideas. The patient does not have insomnia.      Current treatment- ADT  No Known Allergies   Past Medical History:  Diagnosis Date  . Adenocarcinoma of prostate ([place])   . BPH with obstruction/lower urinary tract symptoms   . HLD (hyperlipidemia)   . Paget disease of bone      Past Surgical History:  Procedure Laterality Date  . CHOLECYSTECTOMY    . PROSTATE BIOPSY    . PROSTATE SURGERY      Social History   Social History  . Marital status: Married    Spouse name: N/A  . Number of children: N/A  . Years of education: N/A   Occupational History  . Not on file.   Social History Main Topics  . Smoking status: Former Research scientist (life sciences)  . Smokeless tobacco: Never Used     Comment: quit 50 years ago  . Alcohol use No  . Drug use: No  . Sexual activity: Not on file   Other Topics Concern  . Not on file   Social History Narrative  . No narrative on file    Family History  Problem Relation Age of Onset  . Cancer Son     melanoma  . Breast cancer Sister   . Cancer Brother   . Kidney disease Neg Hx   . Prostate cancer Neg Hx      Current Outpatient Prescriptions:  .  aspirin 81 MG tablet, Take 81 mg by mouth daily., Disp: , Rfl:  .  calcium & magnesium carbonates (MYLANTA) 311-232 MG tablet, Take by mouth., Disp: , Rfl:  .  cholecalciferol (VITAMIN D) 1000 UNITS tablet, Take 1,000 Units by mouth daily., Disp: , Rfl:  .  gabapentin (NEURONTIN) 100 MG capsule, Take 100 mg by mouth 3 (three) times daily., Disp: , Rfl:  .  latanoprost (XALATAN) 0.005 % ophthalmic solution, place 1 drop into both eyes at bedtime, Disp: , Rfl: 0 .  lovastatin (MEVACOR) 40 MG tablet, Take 40 mg by mouth at bedtime., Disp: , Rfl:  .  Misc Natural Products (PROSTATE HEALTH PO), Take by mouth., Disp: , Rfl:  .  Multiple Vitamin (MULTIVITAMIN) tablet, Take 1 tablet by mouth daily., Disp: , Rfl:  .  nystatin-triamcinolone ointment (MYCOLOG), Apply 1 application topically 2 (two) times daily.,  Disp: 30 g, Rfl: 0 .  omeprazole (PRILOSEC) 40 MG capsule, Take 40 mg by mouth daily. Reported on 01/17/2015, Disp: , Rfl: 0 .  citalopram (CELEXA) 10 MG tablet, Take 1 tablet (10 mg total) by mouth daily., Disp: 30 tablet, Rfl: 1  Physical exam:  Vitals:   04/25/16 1356  BP: 137/70  Pulse: 77  Resp: 18  Temp: 97.3 F (36.3 C)  TempSrc: Tympanic  Weight: 169 lb 8 oz (76.9 kg)   Physical Exam  Constitutional: He is oriented to person, place, and time and well-developed, well-nourished, and in no distress.  HENT:  Head: Normocephalic and atraumatic.  Eyes: EOM are normal. Pupils are equal, round, and reactive to light.  Neck: Normal range of motion.  Cardiovascular: Normal rate, regular rhythm and normal heart sounds.   Pulmonary/Chest: Effort normal and breath sounds normal.  Abdominal: Soft. Bowel sounds are normal.  Neurological: He is alert and oriented to person, place, and time.  Skin: Skin is warm and dry.     CMP Latest Ref Rng & Units 04/11/2016  Glucose 65 -  99 mg/dL 94  BUN 6 - 20 mg/dL 14  Creatinine 0.61 - 1.24 mg/dL 0.89  Sodium 135 - 145 mmol/L 136  Potassium 3.5 - 5.1 mmol/L 4.8  Chloride 101 - 111 mmol/L 102  CO2 22 - 32 mmol/L 27  Calcium 8.9 - 10.3 mg/dL 10.0  Total Protein 6.5 - 8.1 g/dL 7.7  Total Bilirubin 0.3 - 1.2 mg/dL 0.8  Alkaline Phos 38 - 126 U/L 75  AST 15 - 41 U/L 30  ALT 17 - 63 U/L 23   CBC Latest Ref Rng & Units 04/11/2016  WBC 3.8 - 10.6 K/uL 6.0  Hemoglobin 13.0 - 18.0 g/dL 15.9  Hematocrit 40.0 - 52.0 % 47.5  Platelets 150 - 440 K/uL 209    No images are attached to the encounter.  Ct Chest W Contrast  Result Date: 04/18/2016 CLINICAL DATA:  Metastatic prostate carcinoma EXAM: CT CHEST, ABDOMEN, AND PELVIS WITH CONTRAST TECHNIQUE: Multidetector CT imaging of the chest, abdomen and pelvis was performed following the standard protocol during bolus administration of intravenous contrast. Oral contrast was also administered. CONTRAST:   152mL ISOVUE-300 IOPAMIDOL (ISOVUE-300) INJECTION 61% COMPARISON:  CT abdomen and pelvis November 09, 2007 FINDINGS: CT CHEST FINDINGS Cardiovascular: There is no appreciable thoracic aortic aneurysm or dissection. There is mild calcification at the origin of the left subclavian artery. Other visualized great vessels appear normal. There is atherosclerotic calcification in the aorta. Pericardium is not appreciably thickened. There are scattered foci of coronary artery calcification. Mediastinum/Nodes: There are subcentimeter nodular lesions in the thyroid consistent with multinodular goiter. No dominant thyroid nodule lesion evident. There is no appreciable thoracic adenopathy. There is reflux of oral contrast into the esophagus. Lungs/Pleura: There is a degree of underlying centrilobular emphysematous change. There is mild bibasilar scarring. There is lower lobe bronchiectatic change bilaterally. On axial slice 34 series 13, there is a 7 x 6 mm nodular opacity in the medial aspect of the posterior segment of the left upper lobe. On axial slice 91 series 13, there is a 5 mm nodular opacity abutting the pleura in the superior segment left lower lobe. No other nodular opacities are evident. No pleural effusion or pleural thickening is evident. Musculoskeletal: There are sclerotic metastatic foci in the T7, T8, and T10 vertebral bodies. There is sclerotic metastatic disease in each scapula. There is sclerotic metastatic disease in the posterior right fifth rib. There are no lytic or destructive lesions. There is degenerative change in the thoracic spine. CT ABDOMEN PELVIS FINDINGS Hepatobiliary: There is persistent elevation of the right hemidiaphragm. There is hepatic steatosis. No focal liver lesions are demonstrable. Gallbladder is absent. There is no appreciable biliary duct dilatation. Pancreas: No pancreatic mass or inflammatory focus. Pancreatic duct is upper normal in size. Spleen: No splenic lesions are  evident. Adrenals/Urinary Tract: Adrenals appear unremarkable. There is an 8 x 8 mm cyst in the medial mid right kidney. There is no non cystic renal mass on either side. There is no hydronephrosis. There is no renal or ureteral calculus on either side. Urinary bladder is midline with wall thickness within normal limits. Stomach/Bowel: There is no appreciable bowel wall or mesenteric thickening. No bowel obstruction. No free air or portal venous air. Vascular/Lymphatic: There is atherosclerotic calcification in the aorta and iliac arteries. There is no evident abdominal aortic aneurysm. Major mesenteric vessels appear intact. There is no appreciable adenopathy in the abdomen or pelvis. Reproductive: The prostate is prominent. There is no extension of known prostate tumor to  the pelvic side walls. The seminal vesicles appear unremarkable. There is no evidence of invasion of prostate carcinoma into the urinary bladder, although the prostate abuts the inferior wall of the bladder. Other: Appendix appears normal. No ascites or abscess is evident in the abdomen or pelvis. There are no evident omental lesions. Musculoskeletal: There is widespread bony metastatic disease throughout the pelvis. There are sclerotic foci in the proximal right femur. There is also coarsening of the trabecula in this area suggesting that there may be Paget's disease in this area. No fracture. There is degenerative change in the lumbar spine. No lytic or destructive bone lesions are evident. No intramuscular or abdominal wall lesion evident. IMPRESSION: CT chest: Bony metastatic disease noted. Nodular opacities in the left upper lobe, largest measuring 7 mm. The small nodular lesions potentially could represent metastatic foci. No larger lesions evident. No adenopathy. There is multinodular goiter without dominant thyroid mass. There are scattered foci of atherosclerotic calcification including scattered foci of coronary artery calcification.  There is a degree of spontaneous gastroesophageal reflux. CT abdomen and pelvis: Bony metastatic disease, sclerotic in character, throughout the pelvis. Suspect combination of bony metastatic disease and Paget's disease in the proximal right femur. No adenopathy.  No focal liver lesions beyond hepatic steatosis. No bowel wall thickening or bowel obstruction. No abscess. Appendix appears normal. No renal or ureteral calculus. No hydronephrosis. Electronically Signed   By: Lowella Grip III M.D.   On: 04/18/2016 11:14   Ct Abdomen Pelvis W Contrast  Result Date: 04/18/2016 CLINICAL DATA:  Metastatic prostate carcinoma EXAM: CT CHEST, ABDOMEN, AND PELVIS WITH CONTRAST TECHNIQUE: Multidetector CT imaging of the chest, abdomen and pelvis was performed following the standard protocol during bolus administration of intravenous contrast. Oral contrast was also administered. CONTRAST:  154mL ISOVUE-300 IOPAMIDOL (ISOVUE-300) INJECTION 61% COMPARISON:  CT abdomen and pelvis November 09, 2007 FINDINGS: CT CHEST FINDINGS Cardiovascular: There is no appreciable thoracic aortic aneurysm or dissection. There is mild calcification at the origin of the left subclavian artery. Other visualized great vessels appear normal. There is atherosclerotic calcification in the aorta. Pericardium is not appreciably thickened. There are scattered foci of coronary artery calcification. Mediastinum/Nodes: There are subcentimeter nodular lesions in the thyroid consistent with multinodular goiter. No dominant thyroid nodule lesion evident. There is no appreciable thoracic adenopathy. There is reflux of oral contrast into the esophagus. Lungs/Pleura: There is a degree of underlying centrilobular emphysematous change. There is mild bibasilar scarring. There is lower lobe bronchiectatic change bilaterally. On axial slice 34 series 13, there is a 7 x 6 mm nodular opacity in the medial aspect of the posterior segment of the left upper lobe. On  axial slice 91 series 13, there is a 5 mm nodular opacity abutting the pleura in the superior segment left lower lobe. No other nodular opacities are evident. No pleural effusion or pleural thickening is evident. Musculoskeletal: There are sclerotic metastatic foci in the T7, T8, and T10 vertebral bodies. There is sclerotic metastatic disease in each scapula. There is sclerotic metastatic disease in the posterior right fifth rib. There are no lytic or destructive lesions. There is degenerative change in the thoracic spine. CT ABDOMEN PELVIS FINDINGS Hepatobiliary: There is persistent elevation of the right hemidiaphragm. There is hepatic steatosis. No focal liver lesions are demonstrable. Gallbladder is absent. There is no appreciable biliary duct dilatation. Pancreas: No pancreatic mass or inflammatory focus. Pancreatic duct is upper normal in size. Spleen: No splenic lesions are evident. Adrenals/Urinary Tract: Adrenals appear  unremarkable. There is an 8 x 8 mm cyst in the medial mid right kidney. There is no non cystic renal mass on either side. There is no hydronephrosis. There is no renal or ureteral calculus on either side. Urinary bladder is midline with wall thickness within normal limits. Stomach/Bowel: There is no appreciable bowel wall or mesenteric thickening. No bowel obstruction. No free air or portal venous air. Vascular/Lymphatic: There is atherosclerotic calcification in the aorta and iliac arteries. There is no evident abdominal aortic aneurysm. Major mesenteric vessels appear intact. There is no appreciable adenopathy in the abdomen or pelvis. Reproductive: The prostate is prominent. There is no extension of known prostate tumor to the pelvic side walls. The seminal vesicles appear unremarkable. There is no evidence of invasion of prostate carcinoma into the urinary bladder, although the prostate abuts the inferior wall of the bladder. Other: Appendix appears normal. No ascites or abscess is  evident in the abdomen or pelvis. There are no evident omental lesions. Musculoskeletal: There is widespread bony metastatic disease throughout the pelvis. There are sclerotic foci in the proximal right femur. There is also coarsening of the trabecula in this area suggesting that there may be Paget's disease in this area. No fracture. There is degenerative change in the lumbar spine. No lytic or destructive bone lesions are evident. No intramuscular or abdominal wall lesion evident. IMPRESSION: CT chest: Bony metastatic disease noted. Nodular opacities in the left upper lobe, largest measuring 7 mm. The small nodular lesions potentially could represent metastatic foci. No larger lesions evident. No adenopathy. There is multinodular goiter without dominant thyroid mass. There are scattered foci of atherosclerotic calcification including scattered foci of coronary artery calcification. There is a degree of spontaneous gastroesophageal reflux. CT abdomen and pelvis: Bony metastatic disease, sclerotic in character, throughout the pelvis. Suspect combination of bony metastatic disease and Paget's disease in the proximal right femur. No adenopathy.  No focal liver lesions beyond hepatic steatosis. No bowel wall thickening or bowel obstruction. No abscess. Appendix appears normal. No renal or ureteral calculus. No hydronephrosis. Electronically Signed   By: Lowella Grip III M.D.   On: 04/18/2016 11:14     Assessment and plan- Patient is a 83 y.o. male with metastatic prostate cancer and bone mets  I reviewed patients CT scans at our tumor board. He does have new bone mets especially on his CT abdomen. Many of his bone lesions on CT thorax are suggestive of pagets disease. He has low volume bone mets. Pulmonary nodules seen on CT too small to know if prostate cancer or not. Also his PSA has gone down since restarting lupron in January. Patient enjoys a good quality of life and already is experiencing hot flashes  with ADT. I discussed possible options at this time: continue ADT and close monitoring with repeat scans in 4 months and pSA monitoring, ADT+ casodex, ADT+ abiraterone or zytiga.  Given his age, I recommend continuing ADT at this point with close monitoring. Patient is agreeable with this plan. He will continue to get ADT with urology.I will see him in 6 weeks with repeat PSA.   Citalopram for hot flashes.  No role for xgeva at this point since he still possibly has castrate sensitive disease   Total face to face encounter time for this patient visit was 30 min. >50% of the time was  spent in counseling and coordination of care.      Visit Diagnosis 1. Prostate cancer ([place])   2. Bone metastases ([place])  Dr. Randa Evens, MD, MPH [place] [facility] Pager- [phone] 04/26/2016 8:27 AM

## 2016-06-04 ENCOUNTER — Other Ambulatory Visit: Payer: Self-pay | Admitting: *Deleted

## 2016-06-04 DIAGNOSIS — C61 Malignant neoplasm of prostate: Secondary | ICD-10-CM

## 2016-06-04 DIAGNOSIS — C7951 Secondary malignant neoplasm of bone: Secondary | ICD-10-CM

## 2016-06-06 ENCOUNTER — Inpatient Hospital Stay: Payer: Medicare Other | Attending: Oncology | Admitting: Oncology

## 2016-06-06 ENCOUNTER — Inpatient Hospital Stay: Payer: Medicare Other

## 2016-06-06 VITALS — BP 132/71 | HR 57 | Temp 97.1°F | Wt 162.7 lb

## 2016-06-06 DIAGNOSIS — Z87891 Personal history of nicotine dependence: Secondary | ICD-10-CM | POA: Insufficient documentation

## 2016-06-06 DIAGNOSIS — R918 Other nonspecific abnormal finding of lung field: Secondary | ICD-10-CM | POA: Insufficient documentation

## 2016-06-06 DIAGNOSIS — Z7982 Long term (current) use of aspirin: Secondary | ICD-10-CM | POA: Diagnosis not present

## 2016-06-06 DIAGNOSIS — C61 Malignant neoplasm of prostate: Secondary | ICD-10-CM

## 2016-06-06 DIAGNOSIS — K219 Gastro-esophageal reflux disease without esophagitis: Secondary | ICD-10-CM | POA: Diagnosis not present

## 2016-06-06 DIAGNOSIS — Z79899 Other long term (current) drug therapy: Secondary | ICD-10-CM

## 2016-06-06 DIAGNOSIS — C7951 Secondary malignant neoplasm of bone: Secondary | ICD-10-CM

## 2016-06-06 DIAGNOSIS — E785 Hyperlipidemia, unspecified: Secondary | ICD-10-CM | POA: Diagnosis not present

## 2016-06-06 DIAGNOSIS — Z8546 Personal history of malignant neoplasm of prostate: Secondary | ICD-10-CM | POA: Diagnosis not present

## 2016-06-06 DIAGNOSIS — E042 Nontoxic multinodular goiter: Secondary | ICD-10-CM | POA: Insufficient documentation

## 2016-06-06 LAB — CBC WITH DIFFERENTIAL/PLATELET
BASOS PCT: 1 %
Basophils Absolute: 0.1 10*3/uL (ref 0–0.1)
EOS ABS: 0.3 10*3/uL (ref 0–0.7)
EOS PCT: 5 %
HCT: 44.8 % (ref 40.0–52.0)
Hemoglobin: 15.4 g/dL (ref 13.0–18.0)
Lymphocytes Relative: 31 %
Lymphs Abs: 1.6 10*3/uL (ref 1.0–3.6)
MCH: 31 pg (ref 26.0–34.0)
MCHC: 34.4 g/dL (ref 32.0–36.0)
MCV: 90.1 fL (ref 80.0–100.0)
MONO ABS: 0.5 10*3/uL (ref 0.2–1.0)
MONOS PCT: 10 %
NEUTROS PCT: 53 %
Neutro Abs: 2.6 10*3/uL (ref 1.4–6.5)
PLATELETS: 193 10*3/uL (ref 150–440)
RBC: 4.97 MIL/uL (ref 4.40–5.90)
RDW: 13.3 % (ref 11.5–14.5)
WBC: 5 10*3/uL (ref 3.8–10.6)

## 2016-06-06 LAB — COMPREHENSIVE METABOLIC PANEL
ALBUMIN: 4.3 g/dL (ref 3.5–5.0)
ALT: 70 U/L — ABNORMAL HIGH (ref 17–63)
ANION GAP: 4 — AB (ref 5–15)
AST: 46 U/L — ABNORMAL HIGH (ref 15–41)
Alkaline Phosphatase: 89 U/L (ref 38–126)
BUN: 15 mg/dL (ref 6–20)
CO2: 29 mmol/L (ref 22–32)
Calcium: 9.6 mg/dL (ref 8.9–10.3)
Chloride: 102 mmol/L (ref 101–111)
Creatinine, Ser: 0.91 mg/dL (ref 0.61–1.24)
GFR calc Af Amer: >60 mL/min (ref >60)
GFR calc non Af Amer: >60 mL/min (ref >60)
GLUCOSE: 119 mg/dL — AB (ref 65–99)
POTASSIUM: 4.2 mmol/L (ref 3.5–5.1)
SODIUM: 135 mmol/L (ref 135–145)
Total Bilirubin: 1 mg/dL (ref 0.3–1.2)
Total Protein: 7.3 g/dL (ref 6.5–8.1)

## 2016-06-06 LAB — PSA: PSA: 1.1 ng/mL (ref 0.00–4.00)

## 2016-06-06 NOTE — Progress Notes (Signed)
Patient denies pan or discomfort at this time, vitals stable and documented.  Patient ambulates without assistance, brought to exam room 5

## 2016-06-07 ENCOUNTER — Encounter: Payer: Self-pay | Admitting: Oncology

## 2016-06-07 NOTE — Progress Notes (Signed)
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 Brock Ra November 2014. In November 2010 he had a prostate biopsy when his PSA was going up to 5.5 and pathology showed adenocarcinoma Gleason score 3+3. Bone scan in October 2014 showed 3 areas of suspicious metastatic disease. Patient also has Paget's disease of the bone and has received Reclast from Dr.Morayati in the past. Patient had been receiving Lupron shots with urology every 6 months and his last dose was in January 2018. PSA increased to 8.7 in January 2018 from a value of 0.68 months ago. In February for the increased 13.4. Patient had a repeat bone scan in February 2018 which showed increased activity in the midthoracic and lower lumbar spine as well as the right shoulder and other joints of visit this and in the right ilium and left ischium which was compared to the bone scan from last year and was concerning for recurrent bony metastases. 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 Nodular opacities in the left upper lobe, largest measuring 7 mm. The small nodular lesions potentially could represent metastatic foci. No larger lesions evident. No adenopathy. There is multinodular goiter without dominant thyroid mass. There are scattered foci of atherosclerotic calcification including  scattered foci of coronary artery calcification. There is a degree of spontaneous gastroesophageal reflux.  CT abdomen and pelvis: Bony metastatic disease, sclerotic in character, throughout the pelvis. Suspect combination of bony metastatic disease and Paget's disease in the proximal right femur.  No adenopathy. No focal liver lesions beyond hepatic steatosis.  No bowel wall thickening or bowel obstruction. No abscess. Appendix appears normal. No renal or ureteral calculus. No hydronephrosis.  Trend of his PSA is as follows:  Component     Latest Ref Rng & Units 10/06/2014 07/20/2015 01/22/2016 02/26/2016  PSA     0.00 - 4.00 ng/mL 1.4 0.6 8.7 (H) 13.4 (H)   Component     Latest Ref Rng & Units 04/11/2016 06/06/2016  PSA     0.00 - 4.00 ng/mL 1.95 1.10   Interval history- he is tolerating ADT well. Occasional hot flashes well controlled. He is active and mows his lawn and independent of his ADL's  ECOG PS- 0 Pain scale- 0 Opioid associated constipation- no  Review of systems- Review of Systems  Constitutional: Negative for chills, fever, malaise/fatigue and weight loss.  HENT: Negative for congestion, ear discharge and nosebleeds.   Eyes: Negative for blurred vision.  Respiratory: Negative for cough, hemoptysis, sputum production, shortness of breath and wheezing.   Cardiovascular: Negative for chest pain, palpitations, orthopnea and claudication.  Gastrointestinal: Negative for abdominal pain, blood in stool, constipation, diarrhea, heartburn, melena, nausea and vomiting.  Genitourinary: Negative for dysuria, flank pain, frequency, hematuria and urgency.  Musculoskeletal: Negative for back pain, joint pain and myalgias.  Skin: Negative for rash.  Neurological: Negative for dizziness, tingling, focal weakness, seizures, weakness and headaches.  Endo/Heme/Allergies: Does not  bruise/bleed easily.  Psychiatric/Behavioral: Negative for depression and suicidal ideas. The patient  does not have insomnia.        No Known Allergies   Past Medical History:  Diagnosis Date  . Adenocarcinoma of prostate ([place])   . BPH with obstruction/lower urinary tract symptoms   . HLD (hyperlipidemia)   . Paget disease of bone      Past Surgical History:  Procedure Laterality Date  . CHOLECYSTECTOMY    . PROSTATE BIOPSY    . PROSTATE SURGERY      Social History   Social History  . Marital status: Married    Spouse name: N/A  . Number of children: N/A  . Years of education: N/A   Occupational History  . Not on file.   Social History Main Topics  . Smoking status: Former Research scientist (life sciences)  . Smokeless tobacco: Never Used     Comment: quit 50 years ago  . Alcohol use No  . Drug use: No  . Sexual activity: Not on file   Other Topics Concern  . Not on file   Social History Narrative  . No narrative on file    Family History  Problem Relation Age of Onset  . Cancer Son        melanoma  . Breast cancer Sister   . Cancer Brother   . Kidney disease Neg Hx   . Prostate cancer Neg Hx      Current Outpatient Prescriptions:  .  aspirin 81 MG tablet, Take 81 mg by mouth daily., Disp: , Rfl:  .  calcium & magnesium carbonates (MYLANTA) 311-232 MG tablet, Take by mouth., Disp: , Rfl:  .  cholecalciferol (VITAMIN D) 1000 UNITS tablet, Take 1,000 Units by mouth daily., Disp: , Rfl:  .  citalopram (CELEXA) 10 MG tablet, Take 1 tablet (10 mg total) by mouth daily., Disp: 30 tablet, Rfl: 1 .  gabapentin (NEURONTIN) 100 MG capsule, Take 100 mg by mouth 3 (three) times daily., Disp: , Rfl:  .  latanoprost (XALATAN) 0.005 % ophthalmic solution, place 1 drop into both eyes at bedtime, Disp: , Rfl: 0 .  lovastatin (MEVACOR) 40 MG tablet, Take 40 mg by mouth at bedtime., Disp: , Rfl:  .  Misc Natural Products (PROSTATE HEALTH PO), Take by mouth., Disp: , Rfl:  .  Multiple Vitamin (MULTIVITAMIN) tablet, Take 1 tablet by mouth daily., Disp: , Rfl:  .  nystatin-triamcinolone  ointment (MYCOLOG), Apply 1 application topically 2 (two) times daily., Disp: 30 g, Rfl: 0 .  omeprazole (PRILOSEC) 40 MG capsule, Take 40 mg by mouth daily. Reported on 01/17/2015, Disp: , Rfl: 0  Physical exam:  Vitals:   06/06/16 1107  BP: 132/71  Pulse: (!) 57  Temp: 97.1 F (36.2 C)  TempSrc: Tympanic  Weight: 162 lb 11.2 oz (73.8 kg)   Physical Exam  Constitutional: He is oriented to person, place, and time and well-developed, well-nourished, and in no distress.  HENT:  Head: Normocephalic and atraumatic.  Eyes: EOM are normal. Pupils are equal, round, and reactive to light.  Neck: Normal range of motion.  Cardiovascular: Normal rate, regular rhythm and normal heart sounds.   Pulmonary/Chest: Effort normal and breath sounds normal.  Abdominal: Soft. Bowel sounds are normal.  Neurological: He is alert and oriented to person, place, and time.  Skin: Skin is warm and dry.     CMP Latest Ref Rng & Units 06/06/2016  Glucose 65 - 99 mg/dL 119(H)  BUN 6 -  20 mg/dL 15  Creatinine 0.61 - 1.24 mg/dL 0.91  Sodium 135 - 145 mmol/L 135  Potassium 3.5 - 5.1 mmol/L 4.2  Chloride 101 - 111 mmol/L 102  CO2 22 - 32 mmol/L 29  Calcium 8.9 - 10.3 mg/dL 9.6  Total Protein 6.5 - 8.1 g/dL 7.3  Total Bilirubin 0.3 - 1.2 mg/dL 1.0  Alkaline Phos 38 - 126 U/L 89  AST 15 - 41 U/L 46(H)  ALT 17 - 63 U/L 70(H)   CBC Latest Ref Rng & Units 06/06/2016  WBC 3.8 - 10.6 K/uL 5.0  Hemoglobin 13.0 - 18.0 g/dL 15.4  Hematocrit 40.0 - 52.0 % 44.8  Platelets 150 - 440 K/uL 193    No images are attached to the encounter.  No results found.   Assessment and plan- Patient is a 83 y.o. male with metastatic prostate cancer and bone mets castrate sensitive  After ADT was restarted, his PSA has continued to respond. He has low volume bone mets and given his age I will hold off on casodex or other additional therapy at this time. He did have pulmonary nodules on Ct thorax which we will follow in august. I  will repeat bone scan at that time as well. If overall his disease is stable, we can continue to follow his PSA without scans subsequently.   RTC in 3 months   Visit Diagnosis 1. Secondary malignant neoplasm of bone ([place])   2. Malignant neoplasm of prostate [facility])      Dr. Randa Evens, MD, MPH [facility] Pager- [phone] 06/07/2016 7:51 AM

## 2016-07-05 DIAGNOSIS — M889 Osteitis deformans of unspecified bone: Secondary | ICD-10-CM | POA: Diagnosis not present

## 2016-07-05 DIAGNOSIS — M4850XA Collapsed vertebra, not elsewhere classified, site unspecified, initial encounter for fracture: Secondary | ICD-10-CM | POA: Diagnosis not present

## 2016-07-05 DIAGNOSIS — R042 Hemoptysis: Secondary | ICD-10-CM | POA: Diagnosis not present

## 2016-07-29 ENCOUNTER — Ambulatory Visit: Payer: Medicare Other

## 2016-07-31 NOTE — Progress Notes (Signed)
11:50 AM   Dean White 10/06/1928 [id number]  Referring provider: Cletis Athens, MD Dean White, Dean White [id number]  Chief Complaint  Patient presents with  . Prostate Cancer    6 month follow up    HPI: Patient is an 83 year old AAM with metastatic prostate cancer who presents today for his 6 month follow up.    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  Bone scan completed on 11/05/2012 noted 3 different areas of high suspicion for metastatic disease. 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 Patient is seeing Dr. Ronnald White and given Reclast.   Bone scan performed in 02/2016 found metastases.  He was referred to the [facility] and was seen by Dr. Janese White.  He underwent a metastases workup    CT chest: Bony metastatic disease noted. Nodular opacities in the left upper lobe, largest measuring 7 mm. The small nodular lesions potentially could represent metastatic foci. No larger lesions evident. No adenopathy. There is multinodular goiter without dominant thyroid mass. There are scattered foci of atherosclerotic calcification including scattered foci of coronary artery calcification. There is a degree of spontaneous gastroesophageal reflux.  CT abdomen and pelvis: Bony metastatic disease, sclerotic in character, throughout the pelvis. Suspect combination of bony metastatic disease and Paget's disease in the proximal right femur.  No adenopathy.  No focal liver lesions beyond hepatic steatosis.  No bowel wall thickening or bowel obstruction. No abscess. Appendix appears normal. No renal or ureteral calculus. No hydronephrosis.  He was complaining of intense hot flashes previously and discontinued the Lupron for 6 months.  He restarted the Lupron at his  last office visit.   The hot flashes are improved.  He denies any weight loss or appetite loss.   His IPSS score today is 8, which is moderate lower urinary tract symptomatology. He is mostly satisfied with his quality life due to his urinary symptoms.  His previous IPSS score was 3/1.       His major complaints today are incomplete emptying, intermittency and a weak urinary stream.  He has had these symptoms for several years.  He denies any dysuria, hematuria or suprapubic pain.   He also denies any recent fevers, chills, nausea or vomiting.      IPSS    Row Name 08/01/16 0800         International Prostate Symptom Score   How often have you had the sensation of not emptying your bladder? Not at All     How often have you had to urinate less than every two hours? Less than 1 in 5 times     How often have you found you stopped and started again several times when you urinated? Less than half the time     How often have you found it difficult to postpone urination? Not at All     How often have you had a weak urinary stream? Less than half the time     How often have you had to strain to start urination? About half the time     How many times did you typically get up at night to urinate? None     Total IPSS Score 8       Quality of Life due to  urinary symptoms   If you were to spend the rest of your life with your urinary condition just the way it is now how would you feel about that? Mostly Satisfied        Score:  1-7 Mild 8-19 Moderate 20-35 Severe    PMH: Past Medical History:  Diagnosis Date  . Adenocarcinoma of prostate ([place]an White)   . BPH with obstruction/lower urinary tract symptoms   . HLD (hyperlipidemia)   . Paget disease of bone     Surgical History: Past Surgical History:  Procedure Laterality Date  . CHOLECYSTECTOMY    . PROSTATE BIOPSY    . PROSTATE SURGERY      Home Medications:  Allergies as of 08/01/2016   No Known Allergies     Medication List        Accurate as of 08/01/16 11:50 AM. Always use your most recent med list.          aspirin 81 MG tablet Take 81 mg by mouth daily.   calcium & magnesium carbonates 311-232 MG tablet Commonly known as:  MYLANTA Take by mouth.   cholecalciferol 1000 units tablet Commonly known as:  VITAMIN D Take 1,000 Units by mouth daily.   citalopram 10 MG tablet Commonly known as:  CELEXA Take 1 tablet (10 mg total) by mouth daily.   gabapentin 100 MG capsule Commonly known as:  NEURONTIN Take 100 mg by mouth 3 (three) times daily.   latanoprost 0.005 % ophthalmic solution Commonly known as:  XALATAN place 1 drop into both eyes at bedtime   lovastatin 40 MG tablet Commonly known as:  MEVACOR Take 40 mg by mouth at bedtime.   LUPRON DEPOT (22-MONTH) 45 MG injection Generic drug:  Leuprolide Acetate (6 Month) Inject 45 mg into the muscle every 6 (six) months.   multivitamin tablet Take 1 tablet by mouth daily.   nystatin-triamcinolone ointment Commonly known as:  MYCOLOG Apply 1 application topically 2 (two) times daily.   omeprazole 40 MG capsule Commonly known as:  PRILOSEC Take 40 mg by mouth daily. Reported on 01/17/2015   PROSTATE HEALTH PO Take by mouth.       Allergies: No Known Allergies  Family History: Family History  Problem Relation Age of Onset  . Cancer Son        melanoma  . Breast cancer Sister   . Cancer Brother   . Kidney disease Neg Hx   . Prostate cancer Neg Hx     Social History:  reports that he has quit smoking. He has never used smokeless tobacco. He reports that he does not drink alcohol or use drugs.  ROS: UROLOGY Frequent Urination?: No Hard to postpone urination?: No Burning/pain with urination?: No Get up at night to urinate?: No Leakage of urine?: No Urine stream starts and stops?: No Trouble starting stream?: No Do you have to strain to urinate?: No Blood in urine?: No Urinary tract infection?: No Sexually transmitted  disease?: No Injury to kidneys or bladder?: No Painful intercourse?: No Weak stream?: No Erection problems?: No Penile pain?: No  Gastrointestinal Nausea?: No Vomiting?: No Indigestion/heartburn?: No Diarrhea?: No Constipation?: No  Constitutional Fever: No Night sweats?: Yes Weight loss?: No Fatigue?: No  Skin Skin rash/lesions?: No Itching?: No  Eyes Blurred vision?: No Double vision?: No  Ears/Nose/Throat Sore throat?: No Sinus problems?: No  Hematologic/Lymphatic Swollen glands?: No Easy bruising?: No  Cardiovascular Leg swelling?: No Chest pain?: No  Respiratory Cough?: No Shortness of breath?: No  Endocrine Excessive thirst?: No  Musculoskeletal Back pain?: No Joint pain?: No  Neurological Headaches?: No Dizziness?: No  Psychologic Depression?: No Anxiety?: No  Physical Exam: BP 116/74   Pulse (!) 58   Ht 5\' 7"  (1.702 m)   Wt 164 lb 3.2 oz (74.5 kg)   BMI 25.72 kg/m   Constitutional: Well nourished. Alert and oriented, No acute distress. HEENT: [place] AT, moist mucus membranes. Trachea midline, no masses. Cardiovascular: No clubbing, cyanosis, or edema. Chest:  Gynecomastia present bilaterally. Respiratory: Normal respiratory effort, no increased work of breathing. GI: Abdomen is soft, non tender, non distended, no abdominal masses. Liver and spleen not palpable.  No hernias appreciated.  Stool sample for occult testing is not indicated.   GU: No CVA tenderness.  No bladder fullness or masses.  Patient with uncircumcised phallus.  Foreskin easily retracted   Urethral meatus is patent.  No penile discharge. No penile lesions or rashes. Scrotum without lesions, cysts, rashes and/or edema.  Testicles are located scrotally bilaterally. No masses are appreciated in the testicles. Left and right epididymis are normal. Rectal: Patient with  normal sphincter tone. Anus and perineum without scarring or rashes. No rectal masses are appreciated.  Prostate is approximately 60 grams, no nodules are appreciated. Seminal vesicles are normal. Skin: No rashes, bruises or suspicious lesions. Lymph: No cervical or inguinal adenopathy. Neurologic: Grossly intact, no focal deficits, moving all 4 extremities. Psychiatric: Normal mood and affect.   Laboratory Data: PSA History:  7.3 ng/mL on 00/00/2013  9.4 ng/mL on 08/00/2013  8.3 ng/mL on 02/00/2014 11.3 ng/mL on 09/00/2014- Patient was started on ADT and Xgeva  2.2 ng/mL on 09/13/2013 13.3 ng/mL on 03/18/2014-Patient had discontinued ADT    2.0 ng/mL on 05/00/2016    1.2 ng/ml on 12/30/2014     0.6 ng/mL in 07/2015 - patient discontinued ADT     8.7 ng/mL in 01/2016    13.4 ng/mL in 02/2016    1.10 ng/mL in 05/2016  Assessment & Plan:    1. Prostate cancer:    - TURP in 2009 found inverted papilloma. TRUSPBx of prostate 11/2008 noted a Gleason's 6 (3+3) in one core with a PSA of 5.5 ng/mL and a prostate volume of 50 cc - now with metastasis to the bone and possibly to the lung  - Lupron 6 month shot given today  - He will RTC in 6 months forI PSS, Lupron and exam.     2. Secondary malignancy to the bone  - followed by Cancer center - PSA continues to respond to ADT therapy  - repeated studies and blood work to be performed in 08/2016  3. Balanitis  - Resolved  4. Gynecomastia  - Spoke with Dr. Jasmine December concerning this and no concerning breast findings on CT and no concerning findings on exam  - will continue to follow clinically  Return in about 6 months (around 02/01/2017) for IPSS snd Lupron.  [name], [facility] [address], [place], [place] [address] [phone]

## 2016-08-01 ENCOUNTER — Ambulatory Visit (INDEPENDENT_AMBULATORY_CARE_PROVIDER_SITE_OTHER): Payer: Medicare Other | Admitting: Urology

## 2016-08-01 ENCOUNTER — Encounter: Payer: Self-pay | Admitting: Urology

## 2016-08-01 VITALS — BP 116/74 | HR 58 | Ht 67.0 in | Wt 164.2 lb

## 2016-08-01 DIAGNOSIS — C7951 Secondary malignant neoplasm of bone: Secondary | ICD-10-CM | POA: Diagnosis not present

## 2016-08-01 DIAGNOSIS — N481 Balanitis: Secondary | ICD-10-CM | POA: Diagnosis not present

## 2016-08-01 DIAGNOSIS — N62 Hypertrophy of breast: Secondary | ICD-10-CM

## 2016-08-01 DIAGNOSIS — C61 Malignant neoplasm of prostate: Secondary | ICD-10-CM

## 2016-08-01 MED ORDER — LEUPROLIDE ACETATE (6 MONTH) 45 MG IM KIT
45.0000 mg | PACK | Freq: Once | INTRAMUSCULAR | Status: AC
  Administered 2016-08-01: 45 mg via INTRAMUSCULAR

## 2016-08-01 NOTE — Progress Notes (Signed)
Lupron IM Injection   Due to Prostate Cancer patient is present today for a Lupron Injection.  Medication: Lupron 6 month Dose: 45 mg  Location: right upper outer buttocks Lot: [id number] Exp: 11/08/2017  Patient tolerated well, no complications were noted  Performed by: Lyndee Hensen CMA  Follow up: 6 months

## 2016-09-04 ENCOUNTER — Encounter
Admission: RE | Admit: 2016-09-04 | Discharge: 2016-09-04 | Disposition: A | Discharge status: Home / Self Care | Payer: Medicare Other | Source: Ambulatory Visit | Attending: Oncology | Admitting: Oncology

## 2016-09-04 ENCOUNTER — Ambulatory Visit
Admission: RE | Admit: 2016-09-04 | Discharge: 2016-09-04 | Disposition: A | Discharge status: Home / Self Care | Payer: Medicare Other | Source: Ambulatory Visit | Attending: Oncology | Admitting: Oncology

## 2016-09-04 ENCOUNTER — Encounter: Admission: RE | Admit: 2016-09-04 | Payer: Medicare Other | Source: Ambulatory Visit

## 2016-09-04 ENCOUNTER — Ambulatory Visit: Payer: Medicare Other

## 2016-09-04 DIAGNOSIS — J439 Emphysema, unspecified: Secondary | ICD-10-CM | POA: Diagnosis not present

## 2016-09-04 DIAGNOSIS — C7951 Secondary malignant neoplasm of bone: Secondary | ICD-10-CM | POA: Diagnosis not present

## 2016-09-04 DIAGNOSIS — M5136 Other intervertebral disc degeneration, lumbar region: Secondary | ICD-10-CM | POA: Insufficient documentation

## 2016-09-04 DIAGNOSIS — M8889 Osteitis deformans of multiple sites: Secondary | ICD-10-CM | POA: Insufficient documentation

## 2016-09-04 DIAGNOSIS — C61 Malignant neoplasm of prostate: Secondary | ICD-10-CM | POA: Diagnosis not present

## 2016-09-04 DIAGNOSIS — R918 Other nonspecific abnormal finding of lung field: Secondary | ICD-10-CM | POA: Diagnosis not present

## 2016-09-04 DIAGNOSIS — I700 Atherosclerosis of aorta: Secondary | ICD-10-CM | POA: Insufficient documentation

## 2016-09-04 LAB — POCT I-STAT CREATININE: Creatinine, Ser: 0.8 mg/dL (ref 0.61–1.24)

## 2016-09-04 MED ORDER — TECHNETIUM TC 99M MEDRONATE IV KIT
25.0000 | PACK | Freq: Once | INTRAVENOUS | Status: AC | PRN
  Administered 2016-09-04: 23.16 via INTRAVENOUS

## 2016-09-04 MED ORDER — IOPAMIDOL (ISOVUE-300) INJECTION 61%
100.0000 mL | Freq: Once | INTRAVENOUS | Status: AC | PRN
  Administered 2016-09-04: 100 mL via INTRAVENOUS

## 2016-09-06 DIAGNOSIS — H40003 Preglaucoma, unspecified, bilateral: Secondary | ICD-10-CM | POA: Diagnosis not present

## 2016-09-06 DIAGNOSIS — H35372 Puckering of macula, left eye: Secondary | ICD-10-CM | POA: Diagnosis not present

## 2016-09-08 DIAGNOSIS — Z23 Encounter for immunization: Secondary | ICD-10-CM | POA: Diagnosis not present

## 2016-09-10 ENCOUNTER — Inpatient Hospital Stay: Payer: Medicare Other

## 2016-09-10 ENCOUNTER — Encounter: Payer: Self-pay | Admitting: Oncology

## 2016-09-10 ENCOUNTER — Inpatient Hospital Stay: Payer: Medicare Other | Attending: Oncology | Admitting: Oncology

## 2016-09-10 VITALS — BP 107/58 | HR 56 | Temp 98.2°F | Ht 67.0 in | Wt 162.3 lb

## 2016-09-10 DIAGNOSIS — C7951 Secondary malignant neoplasm of bone: Secondary | ICD-10-CM | POA: Insufficient documentation

## 2016-09-10 DIAGNOSIS — C61 Malignant neoplasm of prostate: Secondary | ICD-10-CM | POA: Diagnosis not present

## 2016-09-10 DIAGNOSIS — K219 Gastro-esophageal reflux disease without esophagitis: Secondary | ICD-10-CM | POA: Insufficient documentation

## 2016-09-10 DIAGNOSIS — E785 Hyperlipidemia, unspecified: Secondary | ICD-10-CM | POA: Diagnosis not present

## 2016-09-10 DIAGNOSIS — Z87891 Personal history of nicotine dependence: Secondary | ICD-10-CM | POA: Diagnosis not present

## 2016-09-10 DIAGNOSIS — Z79899 Other long term (current) drug therapy: Secondary | ICD-10-CM | POA: Diagnosis not present

## 2016-09-10 DIAGNOSIS — Z7982 Long term (current) use of aspirin: Secondary | ICD-10-CM | POA: Insufficient documentation

## 2016-09-10 DIAGNOSIS — R918 Other nonspecific abnormal finding of lung field: Secondary | ICD-10-CM | POA: Diagnosis not present

## 2016-09-10 DIAGNOSIS — M5136 Other intervertebral disc degeneration, lumbar region: Secondary | ICD-10-CM | POA: Insufficient documentation

## 2016-09-10 DIAGNOSIS — M5137 Other intervertebral disc degeneration, lumbosacral region: Secondary | ICD-10-CM | POA: Diagnosis not present

## 2016-09-10 DIAGNOSIS — E042 Nontoxic multinodular goiter: Secondary | ICD-10-CM | POA: Diagnosis not present

## 2016-09-10 DIAGNOSIS — N62 Hypertrophy of breast: Secondary | ICD-10-CM | POA: Diagnosis not present

## 2016-09-10 DIAGNOSIS — M889 Osteitis deformans of unspecified bone: Secondary | ICD-10-CM | POA: Insufficient documentation

## 2016-09-10 LAB — COMPREHENSIVE METABOLIC PANEL
ALK PHOS: 75 U/L (ref 38–126)
ALT: 27 U/L (ref 17–63)
AST: 29 U/L (ref 15–41)
Albumin: 4 g/dL (ref 3.5–5.0)
Anion gap: 9 (ref 5–15)
BILIRUBIN TOTAL: 0.7 mg/dL (ref 0.3–1.2)
BUN: 16 mg/dL (ref 6–20)
CALCIUM: 9.6 mg/dL (ref 8.9–10.3)
CO2: 26 mmol/L (ref 22–32)
CREATININE: 1.01 mg/dL (ref 0.61–1.24)
Chloride: 105 mmol/L (ref 101–111)
GFR calc non Af Amer: >60 mL/min (ref >60)
Glucose, Bld: 113 mg/dL — ABNORMAL HIGH (ref 65–99)
Potassium: 4.2 mmol/L (ref 3.5–5.1)
Sodium: 140 mmol/L (ref 135–145)
Total Protein: 7.1 g/dL (ref 6.5–8.1)

## 2016-09-10 LAB — CBC WITH DIFFERENTIAL/PLATELET
Basophils Absolute: 0.1 10*3/uL (ref 0–0.1)
Basophils Relative: 1 %
EOS PCT: 5 %
Eosinophils Absolute: 0.3 10*3/uL (ref 0–0.7)
HEMATOCRIT: 44.8 % (ref 40.0–52.0)
HEMOGLOBIN: 15.2 g/dL (ref 13.0–18.0)
LYMPHS ABS: 1.7 10*3/uL (ref 1.0–3.6)
LYMPHS PCT: 34 %
MCH: 30.6 pg (ref 26.0–34.0)
MCHC: 34 g/dL (ref 32.0–36.0)
MCV: 90.1 fL (ref 80.0–100.0)
Monocytes Absolute: 0.5 10*3/uL (ref 0.2–1.0)
Monocytes Relative: 10 %
NEUTROS ABS: 2.5 10*3/uL (ref 1.4–6.5)
NEUTROS PCT: 50 %
Platelets: 221 10*3/uL (ref 150–440)
RBC: 4.97 MIL/uL (ref 4.40–5.90)
RDW: 13 % (ref 11.5–14.5)
WBC: 5 10*3/uL (ref 3.8–10.6)

## 2016-09-10 LAB — PSA: PROSTATIC SPECIFIC ANTIGEN: 0.86 ng/mL (ref 0.00–4.00)

## 2016-09-10 NOTE — Progress Notes (Signed)
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 Brock Ra November 2014. In November 2010 he had a prostate biopsy when his PSA was going up to 5.5 and pathology showed adenocarcinoma Gleason score 3+3. Bone scan in October 2014 showed 3 areas of suspicious metastatic disease. Patient also has Paget's disease of the bone and has received Reclast from Dr.Morayati in the past. Patient had been receiving Lupron shots with urology every 6 months and his last dose was in January 2018. PSA increased to 8.7 in January 2018 from a value of 0.68 months ago. In February for the increased 13.4. Patient had a repeat bone scan in February 2018 which showed increased activity in the midthoracic and lower lumbar spine as well as the right shoulder and other joints of visit this and in the right ilium and left ischium which was compared to the bone scan from last year and was concerning for recurrent bony metastases. 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 Nodular opacities in the left upper lobe, largest measuring 7 mm. The small nodular lesions potentially could represent metastatic foci. No larger lesions evident. No adenopathy. There is multinodular goiter without dominant thyroid mass. There are scattered foci of atherosclerotic calcification including  scattered foci of coronary artery calcification. There is a degree of spontaneous gastroesophageal reflux.  CT abdomen and pelvis: Bony metastatic disease, sclerotic in character, throughout the pelvis. Suspect combination of bony metastatic disease and Paget's disease in the proximal right femur.  No adenopathy. No focal liver lesions beyond hepatic steatosis.  No bowel wall thickening or bowel obstruction. No abscess. Appendix appears normal. No renal or ureteral calculus. No hydronephrosis.  Trend of his PSA is as follows:  Component     Latest Ref Rng & Units 10/06/2014 07/20/2015 01/22/2016 02/26/2016  PSA     0.00 - 4.00 ng/mL 1.4 0.6 8.7 (H) 13.4 (H)   Component     Latest Ref Rng & Units 04/11/2016 06/06/2016  PSA     0.00 - 4.00 ng/mL 1.95 1.10     Interval history- doing well. Denies any complaints  ECOG PS- 1 Pain scale- 0   Review of systems- Review of Systems  Constitutional: Negative for chills, fever, malaise/fatigue and weight loss.  HENT: Negative for congestion, ear discharge and nosebleeds.   Eyes: Negative for blurred vision.  Respiratory: Negative for cough, hemoptysis, sputum production, shortness of breath and wheezing.   Cardiovascular: Negative for chest pain, palpitations, orthopnea and claudication.  Gastrointestinal: Negative for abdominal pain, blood in stool, constipation, diarrhea, heartburn, melena, nausea and vomiting.  Genitourinary: Negative for dysuria, flank pain, frequency, hematuria and urgency.  Musculoskeletal: Negative for back pain, joint pain and myalgias.  Skin: Negative for rash.  Neurological: Negative for dizziness, tingling, focal weakness, seizures, weakness and headaches.  Endo/Heme/Allergies: Does not bruise/bleed easily.       Hot flashes  Psychiatric/Behavioral: Negative for depression and suicidal ideas.  The patient does not have insomnia.      Current treatment- ADT  No Known Allergies   Past Medical History:    Diagnosis Date  . Adenocarcinoma of prostate ([place])   . BPH with obstruction/lower urinary tract symptoms   . HLD (hyperlipidemia)   . Paget disease of bone      Past Surgical History:  Procedure Laterality Date  . CHOLECYSTECTOMY    . PROSTATE BIOPSY    . PROSTATE SURGERY      Social History   Social History  . Marital status: Divorced    Spouse name: N/A  . Number of children: N/A  . Years of education: N/A   Occupational History  . Not on file.   Social History Main Topics  . Smoking status: Former Research scientist (life sciences)  . Smokeless tobacco: Never Used     Comment: quit 50 years ago  . Alcohol use No  . Drug use: No  . Sexual activity: Not on file   Other Topics Concern  . Not on file   Social History Narrative  . No narrative on file    Family History  Problem Relation Age of Onset  . Cancer Son        melanoma  . Breast cancer Sister   . Cancer Brother   . Kidney disease Neg Hx   . Prostate cancer Neg Hx      Current Outpatient Prescriptions:  .  aspirin 81 MG tablet, Take 81 mg by mouth daily., Disp: , Rfl:  .  calcium & magnesium carbonates (MYLANTA) 311-232 MG tablet, Take by mouth., Disp: , Rfl:  .  cholecalciferol (VITAMIN D) 1000 UNITS tablet, Take 1,000 Units by mouth daily., Disp: , Rfl:  .  citalopram (CELEXA) 10 MG tablet, Take 1 tablet (10 mg total) by mouth daily., Disp: 30 tablet, Rfl: 1 .  gabapentin (NEURONTIN) 100 MG capsule, Take 100 mg by mouth 3 (three) times daily., Disp: , Rfl:  .  latanoprost (XALATAN) 0.005 % ophthalmic solution, place 1 drop into both eyes at bedtime, Disp: , Rfl: 0 .  Leuprolide Acetate, 6 Month, (LUPRON DEPOT, 22-MONTH,) 45 MG injection, Inject 45 mg into the muscle every 6 (six) months., Disp: , Rfl:  .  lovastatin (MEVACOR) 40 MG tablet, Take 40 mg by mouth at bedtime., Disp: , Rfl:  .  Misc Natural Products (PROSTATE HEALTH PO), Take by mouth., Disp: , Rfl:  .  Multiple Vitamin (MULTIVITAMIN) tablet, Take 1 tablet by  mouth daily., Disp: , Rfl:  .  nystatin-triamcinolone ointment (MYCOLOG), Apply 1 application topically 2 (two) times daily., Disp: 30 g, Rfl: 0 .  omeprazole (PRILOSEC) 40 MG capsule, Take 40 mg by mouth daily. Reported on 01/17/2015, Disp: , Rfl: 0  Physical exam:  Vitals:   09/10/16 1043  BP: (!) 107/58  Pulse: (!) 56  Temp: 98.2 F (36.8 C)  TempSrc: Oral  Weight: 162 lb 4.8 oz (73.6 kg)  Height: 5\' 7"  (1.702 m)   Physical Exam  Constitutional: He is oriented to person, place, and time and well-developed, well-nourished, and in no distress.  HENT:  Head: Normocephalic and atraumatic.  Eyes: Pupils are equal, round, and reactive to light. EOM are normal.  Neck: Normal range of motion.  Cardiovascular: Normal rate, regular rhythm and normal heart sounds.   Pulmonary/Chest: Effort normal and breath sounds normal.  Mild gynecomastia b/l  Abdominal: Soft. Bowel sounds are normal.  Neurological: He is alert and oriented to person, place, and  time.  Skin: Skin is warm and dry.     CMP Latest Ref Rng & Units 09/10/2016  Glucose 65 - 99 mg/dL 113(H)  BUN 6 - 20 mg/dL 16  Creatinine 0.61 - 1.24 mg/dL 1.01  Sodium 135 - 145 mmol/L 140  Potassium 3.5 - 5.1 mmol/L 4.2  Chloride 101 - 111 mmol/L 105  CO2 22 - 32 mmol/L 26  Calcium 8.9 - 10.3 mg/dL 9.6  Total Protein 6.5 - 8.1 g/dL 7.1  Total Bilirubin 0.3 - 1.2 mg/dL 0.7  Alkaline Phos 38 - 126 U/L 75  AST 15 - 41 U/L 29  ALT 17 - 63 U/L 27   CBC Latest Ref Rng & Units 09/10/2016  WBC 3.8 - 10.6 K/uL 5.0  Hemoglobin 13.0 - 18.0 g/dL 15.2  Hematocrit 40.0 - 52.0 % 44.8  Platelets 150 - 440 K/uL 221    No images are attached to the encounter.  Ct Chest W Contrast  Result Date: 09/04/2016 CLINICAL DATA:  Restaging of prostate cancer. EXAM: CT CHEST, ABDOMEN, AND PELVIS WITH CONTRAST TECHNIQUE: Multidetector CT imaging of the chest, abdomen and pelvis was performed following the standard protocol during bolus administration of  intravenous contrast. CONTRAST:  17mL ISOVUE-300 IOPAMIDOL (ISOVUE-300) INJECTION 61% COMPARISON:  Multiple exams, including 04/18/2016 FINDINGS: CT CHEST FINDINGS Cardiovascular: Aortic arch, branch vessel, and descending thoracic aortic atherosclerotic calcification. Mediastinum/Nodes: Unremarkable Lungs/Pleura: Severe centrilobular emphysema. Elevated right hemidiaphragm with adjacent chronic atelectasis. Left upper lobe pulmonary nodule on image 37/6 measures 0.6 by 0.7 cm, stable compared to 07/16/2010, and has questionable internal punctate calcification. A 2 mm nodule anteriorly in the left upper lobe on image 43/6 is chronically stable and considered benign. Pleural-based nodule in the left lower lobe measuring 3 mm in thickness on image 91/6 is unchanged from 11/09/2007 and considered benign. Musculoskeletal: Degenerative glenohumeral arthropathy bilaterally. Degenerative right sternoclavicular arthropathy. Thoracic spondylosis. Endplate sclerosis at the T7-8 level with sclerosis an trabecular coarsening extending in the posterior elements at T7 in a similar fashion to the prior exam, and roughly similar to 07/16/2010 and 11/09/2007. Stable small sclerotic lesions in the T10 vertebral body, stable from 2009. Faint mixed density inferiorly in the right scapula. CT ABDOMEN PELVIS FINDINGS Hepatobiliary: Stable slight anterior surface lobulation of the lateral segment left hepatic lobe. No discrete hepatic mass identified. Gallbladder presumed surgically absent. Pancreas: Borderline dilated dorsal pancreatic duct, stable. Spleen: Unremarkable Adrenals/Urinary Tract: 5 mm hypodense lesion medially in the right mid kidney. Adrenal glands normal. Stomach/Bowel: Unremarkable Vascular/Lymphatic: Aortoiliac atherosclerotic vascular disease. No pathologic adenopathy identified. Reproductive: The prostate gland measures approximately 4.1 by 4.6 by 4.0 cm (volume = 40 cm^3). No asymmetry of the seminal vesicles. Other:  No supplemental non-categorized findings. Musculoskeletal: Trabecular coarsening and mixed sclerosis and lucency throughout the bony pelvis and also in the right proximal femur, appearance favors Paget's disease over osseous metastatic disease, and is similar to prior exams including 04/18/2016 back through 11/09/2007. Lumbar spondylosis and degenerative disc disease noted, causing impingement at L3-4, L4-5, and especially L5-S1. IMPRESSION: 1. Coarsened trabeculation and fairly classic appearance for Paget's disease involving the pelvis, right proximal femur, and T7 vertebral body, essentially stable from 2009. I am skeptical of superimposed metastatic disease in these regions. 2. Superior endplate sclerosis at T8 appears to be geode degenerative. Small sclerotic lesions in the T10 vertebral body are stable from 2009 an while these could be from old prostate metastatic disease they could also simply be bone islands. 3. Faint makes density along the  inferior tip of the scapula is nonspecific, and likely merit surveillance. However, this area does not appear to have significant asymmetric activity on today' s bone scan. 4. The left upper lobe pulmonary nodules of concern have been stable since 2012, and are likely benign. 5. No adenopathy in the chest, abdomen, or pelvis. 6. Other imaging findings of potential clinical significance: Aortic Atherosclerosis (ICD10-I70.0) and Emphysema (ICD10-J43.9). Degenerative glenohumeral arthropathy bilaterally. Mildly elevated right hemidiaphragm with chronic adjacent volume loss. Prostate gland measures about 40 cubic cm. Lumbar spondylosis and degenerative disc disease causing lower lumbar impingement. Electronically Signed   By: Van Clines M.D.   On: 09/04/2016 16:43   Nm Bone Scan Whole Body  Result Date: 09/04/2016 CLINICAL DATA:  History of prostate carcinoma. Subsequent encounter. EXAM: NUCLEAR MEDICINE WHOLE BODY BONE SCAN TECHNIQUE: Whole body anterior and  posterior images were obtained approximately 3 hours after intravenous injection of radiopharmaceutical. RADIOPHARMACEUTICALS:  23.16 mCi Technetium-71m MDP IV COMPARISON:  Whole-body bone scan 11/05/2012. CT chest, abdomen and pelvis 04/18/2016 and this same day. FINDINGS: Abnormal uptake is seen throughout the sacrum. There is also patchy abnormal uptake in the right hip. Intense uptake is seen centered about the T7-8 level. Mildly increased uptake is also seen in the midshaft of the femurs bilaterally, greater on the right. Increased activity is seen about the sternoclavicular joints and shoulders bilaterally. Mildly increased uptake is also seen about the knees. IMPRESSION: Abnormal uptake throughout the sacrum is consistent with Paget's disease as seen on the comparison CT scans. Punctate sclerotic foci in the sacrum on CT scan are highly suspicious for metastatic disease and obscured on this exam. 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 Increased activity in the midthoracic spine correlates with degenerative disease and endplate sclerosis at K4-4. Punctate sclerotic focus in T10 on CT scan worrisome for metastatic disease is not visible on this exam. Mildly increased uptake in the mid shafts of the femurs bilaterally could be due to metastatic disease. Electronically Signed   By: Inge Rise M.D.   On: 09/04/2016 16:04   Ct Abdomen Pelvis W Contrast  Result Date: 09/04/2016 CLINICAL DATA:  Restaging of prostate cancer. EXAM: CT CHEST, ABDOMEN, AND PELVIS WITH CONTRAST TECHNIQUE: Multidetector CT imaging of the chest, abdomen and pelvis was performed following the standard protocol during bolus administration of intravenous contrast. CONTRAST:  169mL ISOVUE-300 IOPAMIDOL (ISOVUE-300) INJECTION 61% COMPARISON:  Multiple exams, including 04/18/2016 FINDINGS: CT CHEST FINDINGS Cardiovascular: Aortic arch, branch vessel, and descending thoracic aortic atherosclerotic  calcification. Mediastinum/Nodes: Unremarkable Lungs/Pleura: Severe centrilobular emphysema. Elevated right hemidiaphragm with adjacent chronic atelectasis. Left upper lobe pulmonary nodule on image 37/6 measures 0.6 by 0.7 cm, stable compared to 07/16/2010, and has questionable internal punctate calcification. A 2 mm nodule anteriorly in the left upper lobe on image 43/6 is chronically stable and considered benign. Pleural-based nodule in the left lower lobe measuring 3 mm in thickness on image 91/6 is unchanged from 11/09/2007 and considered benign. Musculoskeletal: Degenerative glenohumeral arthropathy bilaterally. Degenerative right sternoclavicular arthropathy. Thoracic spondylosis. Endplate sclerosis at the T7-8 level with sclerosis an trabecular coarsening extending in the posterior elements at T7 in a similar fashion to the prior exam, and roughly similar to 07/16/2010 and 11/09/2007. Stable small sclerotic lesions in the T10 vertebral body, stable from 2009. Faint mixed density inferiorly in the right scapula. CT ABDOMEN PELVIS FINDINGS Hepatobiliary: Stable slight anterior surface lobulation of the lateral segment left hepatic lobe. No discrete hepatic mass  identified. Gallbladder presumed surgically absent. Pancreas: Borderline dilated dorsal pancreatic duct, stable. Spleen: Unremarkable Adrenals/Urinary Tract: 5 mm hypodense lesion medially in the right mid kidney. Adrenal glands normal. Stomach/Bowel: Unremarkable Vascular/Lymphatic: Aortoiliac atherosclerotic vascular disease. No pathologic adenopathy identified. Reproductive: The prostate gland measures approximately 4.1 by 4.6 by 4.0 cm (volume = 40 cm^3). No asymmetry of the seminal vesicles. Other: No supplemental non-categorized findings. Musculoskeletal: Trabecular coarsening and mixed sclerosis and lucency throughout the bony pelvis and also in the right proximal femur, appearance favors Paget's disease over osseous metastatic disease, and is  similar to prior exams including 04/18/2016 back through 11/09/2007. Lumbar spondylosis and degenerative disc disease noted, causing impingement at L3-4, L4-5, and especially L5-S1. IMPRESSION: 1. Coarsened trabeculation and fairly classic appearance for Paget's disease involving the pelvis, right proximal femur, and T7 vertebral body, essentially stable from 2009. I am skeptical of superimposed metastatic disease in these regions. 2. Superior endplate sclerosis at T8 appears to be geode degenerative. Small sclerotic lesions in the T10 vertebral body are stable from 2009 an while these could be from old prostate metastatic disease they could also simply be bone islands. 3. Faint makes density along the inferior tip of the scapula is nonspecific, and likely merit surveillance. However, this area does not appear to have significant asymmetric activity on today' s bone scan. 4. The left upper lobe pulmonary nodules of concern have been stable since 2012, and are likely benign. 5. No adenopathy in the chest, abdomen, or pelvis. 6. Other imaging findings of potential clinical significance: Aortic Atherosclerosis (ICD10-I70.0) and Emphysema (ICD10-J43.9). Degenerative glenohumeral arthropathy bilaterally. Mildly elevated right hemidiaphragm with chronic adjacent volume loss. Prostate gland measures about 40 cubic cm. Lumbar spondylosis and degenerative disc disease causing lower lumbar impingement. Electronically Signed   By: Van Clines M.D.   On: 09/04/2016 16:43     Assessment and plan- Patient is a 83 y.o. male metastatic prostate cancer and bone mets castrate sensitive  Discussed results of CT chest/ abdomen and bone scan. Reviewed images independently. Pulmonary nodules have remained stable. Bone lesions noted predominantly are due to pagets disease. He continues to be ON ADT and his PSA is responding well again. I would not add any further treatment such as casodex, docetaxel or zytiga at this point.  He will continue to get ADT through urology. Will hold off on further scans and monitor his disease with PSA.  Gynecomastia- due to ADT. Continue to monitor  RTC in 4 months with cbc, cmp and PSA   Visit Diagnosis 1. Malignant neoplasm of prostate [facility])      Dr. Randa Evens, MD, MPH [facility] Pager- [phone] 09/10/2016 2:17 PM

## 2016-09-10 NOTE — Progress Notes (Signed)
Patient here for follow up no changes since last appointment 

## 2016-09-11 LAB — TESTOSTERONE, FREE: TESTOSTERONE FREE: 0.9 pg/mL — AB (ref 6.6–18.1)

## 2016-10-03 DIAGNOSIS — C61 Malignant neoplasm of prostate: Secondary | ICD-10-CM | POA: Diagnosis not present

## 2016-10-03 DIAGNOSIS — J42 Unspecified chronic bronchitis: Secondary | ICD-10-CM | POA: Diagnosis not present

## 2016-10-03 DIAGNOSIS — M818 Other osteoporosis without current pathological fracture: Secondary | ICD-10-CM | POA: Diagnosis not present

## 2016-10-03 DIAGNOSIS — M76891 Other specified enthesopathies of right lower limb, excluding foot: Secondary | ICD-10-CM | POA: Diagnosis not present

## 2016-12-10 ENCOUNTER — Encounter: Payer: Self-pay | Admitting: Oncology

## 2016-12-10 ENCOUNTER — Inpatient Hospital Stay: Payer: Medicare Other

## 2016-12-10 ENCOUNTER — Other Ambulatory Visit: Payer: Self-pay

## 2016-12-10 ENCOUNTER — Inpatient Hospital Stay: Payer: Medicare Other | Attending: Oncology | Admitting: Oncology

## 2016-12-10 VITALS — BP 101/58 | HR 61 | Temp 98.1°F | Resp 14 | Wt 168.0 lb

## 2016-12-10 DIAGNOSIS — K219 Gastro-esophageal reflux disease without esophagitis: Secondary | ICD-10-CM | POA: Diagnosis not present

## 2016-12-10 DIAGNOSIS — E042 Nontoxic multinodular goiter: Secondary | ICD-10-CM | POA: Diagnosis not present

## 2016-12-10 DIAGNOSIS — R9721 Rising PSA following treatment for malignant neoplasm of prostate: Secondary | ICD-10-CM | POA: Insufficient documentation

## 2016-12-10 DIAGNOSIS — C61 Malignant neoplasm of prostate: Secondary | ICD-10-CM | POA: Diagnosis not present

## 2016-12-10 DIAGNOSIS — Z79899 Other long term (current) drug therapy: Secondary | ICD-10-CM | POA: Insufficient documentation

## 2016-12-10 DIAGNOSIS — E785 Hyperlipidemia, unspecified: Secondary | ICD-10-CM | POA: Diagnosis not present

## 2016-12-10 DIAGNOSIS — C7951 Secondary malignant neoplasm of bone: Secondary | ICD-10-CM | POA: Diagnosis not present

## 2016-12-10 DIAGNOSIS — Z87891 Personal history of nicotine dependence: Secondary | ICD-10-CM | POA: Diagnosis not present

## 2016-12-10 DIAGNOSIS — R740 Nonspecific elevation of levels of transaminase and lactic acid dehydrogenase [LDH]: Secondary | ICD-10-CM | POA: Diagnosis not present

## 2016-12-10 LAB — COMPREHENSIVE METABOLIC PANEL
ALBUMIN: 4 g/dL (ref 3.5–5.0)
ALT: 164 U/L — ABNORMAL HIGH (ref 17–63)
AST: 100 U/L — AB (ref 15–41)
Alkaline Phosphatase: 127 U/L — ABNORMAL HIGH (ref 38–126)
Anion gap: 9 (ref 5–15)
BILIRUBIN TOTAL: 0.7 mg/dL (ref 0.3–1.2)
BUN: 12 mg/dL (ref 6–20)
CHLORIDE: 104 mmol/L (ref 101–111)
CO2: 26 mmol/L (ref 22–32)
Calcium: 9.6 mg/dL (ref 8.9–10.3)
Creatinine, Ser: 0.95 mg/dL (ref 0.61–1.24)
GFR calc Af Amer: >60 mL/min (ref >60)
GFR calc non Af Amer: >60 mL/min (ref >60)
GLUCOSE: 120 mg/dL — AB (ref 65–99)
POTASSIUM: 4.1 mmol/L (ref 3.5–5.1)
Sodium: 139 mmol/L (ref 135–145)
TOTAL PROTEIN: 7.1 g/dL (ref 6.5–8.1)

## 2016-12-10 LAB — CBC WITH DIFFERENTIAL/PLATELET
Basophils Absolute: 0.1 10*3/uL (ref 0–0.1)
Basophils Relative: 1 %
EOS PCT: 3 %
Eosinophils Absolute: 0.2 10*3/uL (ref 0–0.7)
HCT: 46.1 % (ref 40.0–52.0)
Hemoglobin: 15.1 g/dL (ref 13.0–18.0)
LYMPHS ABS: 1.6 10*3/uL (ref 1.0–3.6)
LYMPHS PCT: 33 %
MCH: 29.9 pg (ref 26.0–34.0)
MCHC: 32.7 g/dL (ref 32.0–36.0)
MCV: 91.4 fL (ref 80.0–100.0)
MONO ABS: 0.5 10*3/uL (ref 0.2–1.0)
MONOS PCT: 10 %
Neutro Abs: 2.6 10*3/uL (ref 1.4–6.5)
Neutrophils Relative %: 53 %
PLATELETS: 203 10*3/uL (ref 150–440)
RBC: 5.04 MIL/uL (ref 4.40–5.90)
RDW: 13.5 % (ref 11.5–14.5)
WBC: 4.9 10*3/uL (ref 3.8–10.6)

## 2016-12-10 LAB — PSA: Prostatic Specific Antigen: 0.45 ng/mL (ref 0.00–4.00)

## 2016-12-10 NOTE — Progress Notes (Signed)
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 Brock Ra November 2014. In November 2010 he had a prostate biopsy when his PSA was going up to 5.5 and pathology showed adenocarcinoma Gleason score 3+3. Bone scan in October 2014 showed 3 areas of suspicious metastatic disease. Patient also has Paget's disease of the bone and has received Reclast from Dr.Morayati in the past. Patient had been receiving Lupron shots with urology every 6 months and his last dose was in January 2018. PSA increased to 8.7 in January 2018 from a value of 0.68 months ago. In February for the increased 13.4. Patient had a repeat bone scan in February 2018 which showed increased activity in the midthoracic and lower lumbar spine as well as the right shoulder and other joints of visit this and in the right ilium and left ischium which was compared to the bone scan from last year and was concerning for recurrent bony metastases. 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 Nodular opacities in the left upper lobe, largest measuring 7 mm. The small nodular lesions potentially could represent metastatic foci. No larger lesions evident. No adenopathy. There is multinodular goiter without dominant thyroid mass. There are scattered foci of  atherosclerotic calcification including scattered foci of coronary artery calcification. There is a degree of spontaneous gastroesophageal reflux.  CT abdomen and pelvis: Bony metastatic disease, sclerotic in character, throughout the pelvis. Suspect combination of bony metastatic disease and Paget's disease in the proximal right femur.  No adenopathy. No focal liver lesions beyond hepatic steatosis.  No bowel wall thickening or bowel obstruction. No abscess. Appendix appears normal. No renal or ureteral calculus. No hydronephrosis.   PSA has responded well to ADT and continues to trend down  Interval history- he is doing well.  He has some mild fatigue at baseline which is unchanged.  Does report on and off flashes secondary to ADD which is self-limited.  He continues to be independent of his ADLs and IADLs.  Denies any new aches or pains, loss of appetite or unintentional weight loss  ECOG PS- 0 Pain scale- 0 Opioid associated constipation- no  Review of systems- Review of Systems  Constitutional: Positive for malaise/fatigue. Negative for chills, fever and weight loss.  HENT: Negative for congestion, ear discharge and nosebleeds.   Eyes: Negative for blurred vision.  Respiratory: Negative for cough, hemoptysis, sputum production, shortness of breath and wheezing.   Cardiovascular: Negative for chest pain, palpitations, orthopnea and claudication.  Gastrointestinal: Negative for abdominal pain, blood in stool, constipation, diarrhea, heartburn, melena, nausea and vomiting.  Genitourinary: Negative for dysuria, flank pain, frequency, hematuria and urgency.  Musculoskeletal: Negative for back pain, joint pain and myalgias.  Skin: Negative for rash.  Neurological: Negative for dizziness, tingling, focal weakness, seizures, weakness and headaches.  Endo/Heme/Allergies: Does not bruise/bleed easily.       Hot flashes positive  Psychiatric/Behavioral: Negative for depression and  suicidal  ideas. The patient does not have insomnia.       No Known Allergies   Past Medical History:  Diagnosis Date  . Adenocarcinoma of prostate ([place])   . BPH with obstruction/lower urinary tract symptoms   . HLD (hyperlipidemia)   . Paget disease of bone      Past Surgical History:  Procedure Laterality Date  . CHOLECYSTECTOMY    . PROSTATE BIOPSY    . PROSTATE SURGERY      Social History   Socioeconomic History  . Marital status: Divorced    Spouse name: Not on file  . Number of children: Not on file  . Years of education: Not on file  . Highest education level: Not on file  Social Needs  . Financial resource strain: Not on file  . Food insecurity - worry: Not on file  . Food insecurity - inability: Not on file  . Transportation needs - medical: Not on file  . Transportation needs - non-medical: Not on file  Occupational History  . Not on file  Tobacco Use  . Smoking status: Former Research scientist (life sciences)  . Smokeless tobacco: Never Used  . Tobacco comment: quit 50 years ago  Substance and Sexual Activity  . Alcohol use: No    Alcohol/week: 0.0 oz  . Drug use: No  . Sexual activity: Not on file  Other Topics Concern  . Not on file  Social History Narrative  . Not on file    Family History  Problem Relation Age of Onset  . Cancer Son        melanoma  . Breast cancer Sister   . Cancer Brother   . Kidney disease Neg Hx   . Prostate cancer Neg Hx      Current Outpatient Medications:  .  aspirin 81 MG tablet, Take 81 mg by mouth daily., Disp: , Rfl:  .  calcium & magnesium carbonates (MYLANTA) 311-232 MG tablet, Take by mouth., Disp: , Rfl:  .  cholecalciferol (VITAMIN D) 1000 UNITS tablet, Take 1,000 Units by mouth daily., Disp: , Rfl:  .  latanoprost (XALATAN) 0.005 % ophthalmic solution, place 1 drop into both eyes at bedtime, Disp: , Rfl: 0 .  Leuprolide Acetate, 6 Month, (LUPRON DEPOT, 74-MONTH,) 45 MG injection, Inject 45 mg into the muscle every 6 (six)  months., Disp: , Rfl:  .  lovastatin (MEVACOR) 40 MG tablet, Take 40 mg by mouth at bedtime., Disp: , Rfl:  .  Misc Natural Products (PROSTATE HEALTH PO), Take by mouth., Disp: , Rfl:  .  Multiple Vitamin (MULTIVITAMIN) tablet, Take 1 tablet by mouth daily., Disp: , Rfl:   Physical exam:  Vitals:   12/10/16 1129  BP: (!) 101/58  Pulse: 61  Resp: 14  Temp: 98.1 F (36.7 C)  TempSrc: Tympanic  Weight: 168 lb (76.2 kg)   Physical Exam  Constitutional: He is oriented to person, place, and time and well-developed, well-nourished, and in no distress.  HENT:  Head: Normocephalic and atraumatic.  Eyes: EOM are normal. Pupils are equal, round, and reactive to light.  Neck: Normal range of motion.  Cardiovascular: Normal rate, regular rhythm and normal heart sounds.  Pulmonary/Chest: Effort normal and breath sounds normal.  Abdominal: Soft. Bowel sounds are normal.  Neurological: He is alert and oriented to person, place, and time.  Skin: Skin is warm and dry.     CMP Latest Ref Rng & Units 12/10/2016  Glucose 65 - 99 mg/dL 120(H)  BUN 6 -  20 mg/dL 12  Creatinine 0.61 - 1.24 mg/dL 0.95  Sodium 135 - 145 mmol/L 139  Potassium 3.5 - 5.1 mmol/L 4.1  Chloride 101 - 111 mmol/L 104  CO2 22 - 32 mmol/L 26  Calcium 8.9 - 10.3 mg/dL 9.6  Total Protein 6.5 - 8.1 g/dL 7.1  Total Bilirubin 0.3 - 1.2 mg/dL 0.7  Alkaline Phos 38 - 126 U/L 127(H)  AST 15 - 41 U/L 100(H)  ALT 17 - 63 U/L 164(H)   CBC Latest Ref Rng & Units 12/10/2016  WBC 3.8 - 10.6 K/uL 4.9  Hemoglobin 13.0 - 18.0 g/dL 15.1  Hematocrit 40.0 - 52.0 % 46.1  Platelets 150 - 440 K/uL 203     Assessment and plan- Patient is a 83 y.o. 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  His prior PSA trend showed that he is responding to ADT alone.  Given his age and low volume disease no plan to add Zytiga or docetaxel at this time.  No role for bisphosphonates  for castrate sensitive disease.  He will continue to get ADT through urology and he will be seeing them in February 2019.  I will see him 6 months from now with a repeat CBC, CMP and a PSA.  He will have interim CBC CMP and PSA checked at 3 months.    He does have mildly elevated AST ALT and alkaline phosphatase noted today.  He has not had any abnormal LFTs in the past.  Repeat in 3 months and decide about further management at that time based on the levels   Visit Diagnosis 1. Secondary malignant neoplasm of bone ([place])   2. Malignant neoplasm of prostate [facility])      Dr. Randa Evens, MD, MPH [facility] at [facility] Pager- [phone] 12/10/2016 12:02 PM

## 2016-12-10 NOTE — Progress Notes (Signed)
Patient here today for follow up with labs. He states that he is feeling well and denies having any pain. He does report feeling more tired than usual although he is getting plenty of sleep. He reports having poor circulation and sometimes has trouble with his feet feeling cold.

## 2017-01-01 DIAGNOSIS — M818 Other osteoporosis without current pathological fracture: Secondary | ICD-10-CM | POA: Diagnosis not present

## 2017-01-01 DIAGNOSIS — M889 Osteitis deformans of unspecified bone: Secondary | ICD-10-CM | POA: Diagnosis not present

## 2017-01-01 DIAGNOSIS — C61 Malignant neoplasm of prostate: Secondary | ICD-10-CM | POA: Diagnosis not present

## 2017-01-01 DIAGNOSIS — M76891 Other specified enthesopathies of right lower limb, excluding foot: Secondary | ICD-10-CM | POA: Diagnosis not present

## 2017-01-20 DIAGNOSIS — M5093 Cervical disc disorder, unspecified, cervicothoracic region: Secondary | ICD-10-CM | POA: Diagnosis not present

## 2017-01-20 DIAGNOSIS — M818 Other osteoporosis without current pathological fracture: Secondary | ICD-10-CM | POA: Diagnosis not present

## 2017-01-20 DIAGNOSIS — M889 Osteitis deformans of unspecified bone: Secondary | ICD-10-CM | POA: Diagnosis not present

## 2017-01-20 DIAGNOSIS — M76891 Other specified enthesopathies of right lower limb, excluding foot: Secondary | ICD-10-CM | POA: Diagnosis not present

## 2017-01-30 DIAGNOSIS — K828 Other specified diseases of gallbladder: Secondary | ICD-10-CM | POA: Diagnosis not present

## 2017-02-12 ENCOUNTER — Telehealth: Payer: Self-pay | Admitting: Oncology

## 2017-02-12 NOTE — Telephone Encounter (Signed)
Rschd Lab/MD, per MD on PAL. Rschd and conf updated appt with patient. Appt schd mailed. MF °

## 2017-02-23 NOTE — Progress Notes (Signed)
8:41 AM   Dean White 09-Jul-1928 [id number]  Referring provider: Cletis Athens, MD [place] [address] [employer], [place] [address]  Chief Complaint  Patient presents with  . Prostate Cancer    HPI: Patient is an 84 year old AAM with metastatic prostate cancer who presents today for his 6 month follow up.    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  Bone scan completed on 11/05/2012 noted 3 different areas of high suspicion for metastatic disease. 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 Current PSA is 0.45 in 11/2016.    Bone scan performed in 02/2016 found metastases.  He was [facility] to [facility] and was seen by Dr. Janese Banks.  He underwent a metastases workup    04/2016 CT chest: Bony metastatic disease noted. Nodular opacities in the left upper lobe, largest measuring 7 mm. The small nodular lesions potentially could represent metastatic foci. No larger lesions evident. No adenopathy. There is multinodular goiter without dominant thyroid mass. There are scattered foci of atherosclerotic calcification including scattered foci of coronary artery calcification. There is a degree of spontaneous gastroesophageal reflux.  04/2016 CT abdomen and pelvis: Bony metastatic disease, sclerotic in character, throughout the pelvis. Suspect combination of bony metastatic disease and Paget's disease in the proximal right femur.  No adenopathy.  No focal liver lesions beyond hepatic steatosis.  No bowel wall thickening or bowel obstruction. No abscess. Appendix appears normal. No renal or ureteral calculus. No hydronephrosis.  08/2016 bone scan noted Abnormal uptake throughout the sacrum is consistent with Paget's disease as seen on the comparison CT scans. Punctate  sclerotic foci in the sacrum on CT scan are highly suspicious for metastatic disease and obscured on this exam.  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  Increased activity in the midthoracic spine correlates with degenerative disease and endplate sclerosis at V2-0. Punctate sclerotic focus in T10 on CT scan worrisome for metastatic disease is not visible on this exam.  Mildly increased uptake in the mid shafts of the femurs bilaterally could be due to metastatic disease.  08/2016 CT chest/abd/pelvis noted  Coarsened trabeculation and fairly classic appearance for Paget's disease involving the pelvis, right proximal femur, and T7 vertebral body, essentially stable from 2009. I am skeptical of superimposed metastatic disease in these regions. 2. Superior endplate sclerosis at T8 appears to be geode degenerative. Small sclerotic lesions in the T10 vertebral body are stable from 2009 an while these could be from old prostate metastatic disease they could also simply be bone islands. 3. Faint makes density along the inferior tip of the scapula is nonspecific, and likely merit surveillance. However, this area does not appear to have significant asymmetric activity on today' s bone scan. 4. The left upper lobe pulmonary nodules of concern have been stable since 2012, and are likely benign. 5. No adenopathy in the chest, abdomen, or pelvis. 6. Other imaging findings of potential clinical significance: Aortic Atherosclerosis (ICD10-I70.0) and Emphysema (ICD10-J43.9). Degenerative glenohumeral arthropathy bilaterally. Mildly elevated right hemidiaphragm with chronic adjacent volume loss. Prostate gland measures about 40 cubic cm. Lumbar spondylosis and degenerative disc disease causing lower lumbar impingement.  He was complaining of intense hot flashes previously and discontinued the Lupron for 6 months.  He restarted the Lupron at his last  office visit.   The hot flashes are improved.  He denies any weight loss or appetite loss.   His IPSS score today is 7, which is mild lower urinary tract symptomatology. He is mostly satisfied with his quality life due to his urinary symptoms.  His previous IPSS score was 8/2/         His major complaints today are intermittency.   He denies any dysuria, hematuria or suprapubic pain.    He also denies any recent fevers, chills, nausea or vomiting.  IPSS    Row Name 02/24/17 0800         International Prostate Symptom Score   How often have you had the sensation of not emptying your bladder?  About half the time     How often have you had to urinate less than every two hours?  Less than half the time     How often have you found you stopped and started again several times when you urinated?  Not at All     How often have you found it difficult to postpone urination?  Not at All     How often have you had a weak urinary stream?  Less than half the time     How often have you had to strain to start urination?  Not at All     How many times did you typically get up at night to urinate?  None     Total IPSS Score  7       Quality of Life due to urinary symptoms   If you were to spend the rest of your life with your urinary condition just the way it is now how would you feel about that?  Mostly Satisfied        Score:  1-7 Mild 8-19 Moderate 20-35 Severe    PMH: Past Medical History:  Diagnosis Date  . Adenocarcinoma of prostate ([place])   . BPH with obstruction/lower urinary tract symptoms   . HLD (hyperlipidemia)   . Paget disease of bone     Surgical History: Past Surgical History:  Procedure Laterality Date  . CHOLECYSTECTOMY    . PROSTATE BIOPSY    . PROSTATE SURGERY      Home Medications:  Allergies as of 02/24/2017   No Known Allergies     Medication List        Accurate as of 02/24/17  8:41 AM. Always use your most recent med list.          aspirin 81 MG  tablet Take 81 mg by mouth daily.   cholecalciferol 1000 units tablet Commonly known as:  VITAMIN D Take 1,000 Units by mouth daily.   latanoprost 0.005 % ophthalmic solution Commonly known as:  XALATAN place 1 drop into both eyes at bedtime   lovastatin 40 MG tablet Commonly known as:  MEVACOR Take 40 mg by mouth at bedtime.   LUPRON DEPOT (97-MONTH) 45 MG injection Generic drug:  Leuprolide Acetate (6 Month) Inject 45 mg into the muscle every 6 (six) months.   multivitamin tablet Take 1 tablet by mouth daily.   PROSTATE HEALTH PO Take by mouth.       Allergies: No Known Allergies  Family History: Family History  Problem Relation Age of Onset  . Cancer Son        melanoma  . Breast cancer Sister   . Cancer Brother   . Kidney disease  Neg Hx   . Prostate cancer Neg Hx     Social History:  reports that he has quit smoking. he has never used smokeless tobacco. He reports that he does not drink alcohol or use drugs.  ROS: UROLOGY Frequent Urination?: No Hard to postpone urination?: No Burning/pain with urination?: No Get up at night to urinate?: No Leakage of urine?: No Urine stream starts and stops?: Yes Trouble starting stream?: No Do you have to strain to urinate?: No Blood in urine?: No Urinary tract infection?: No Sexually transmitted disease?: No Injury to kidneys or bladder?: No Painful intercourse?: No Weak stream?: No Erection problems?: No Penile pain?: No  Gastrointestinal Nausea?: No Vomiting?: No Indigestion/heartburn?: No Diarrhea?: No Constipation?: No  Constitutional Fever: No Night sweats?: Yes Weight loss?: No Fatigue?: No  Skin Skin rash/lesions?: No Itching?: No  Eyes Blurred vision?: Yes Double vision?: No  Ears/Nose/Throat Sore throat?: No Sinus problems?: No  Hematologic/Lymphatic Swollen glands?: No Easy bruising?: No  Cardiovascular Leg swelling?: No Chest pain?: No  Respiratory Cough?: No Shortness  of breath?: No  Endocrine Excessive thirst?: No  Musculoskeletal Back pain?: Yes Joint pain?: Yes  Neurological Headaches?: No Dizziness?: No  Psychologic Depression?: No Anxiety?: No  Physical Exam: BP 118/71   Pulse 66   Ht '5\' 7"'  (1.702 m)   Wt 168 lb (76.2 kg)   BMI 26.31 kg/m   Constitutional: Well nourished. Alert and oriented, No acute distress. HEENT: [place] AT, moist mucus membranes. Trachea midline, no masses. Cardiovascular: No clubbing, cyanosis, or edema. Respiratory: Normal respiratory effort, no increased work of breathing. GI: Abdomen is soft, non tender, non distended, no abdominal masses. Liver and spleen not palpable.  No hernias appreciated.  Stool sample for occult testing is not indicated.   GU: No CVA tenderness.  No bladder fullness or masses.  Patient with uncircumcised phallus.  Foreskin easily retracted  Urethral meatus is patent.  No penile discharge. No penile lesions or rashes. Scrotum without lesions, cysts, rashes and/or edema.  Testicles are located scrotally bilaterally. No masses are appreciated in the testicles. Left and right epididymis are normal. Rectal: Patient with  normal sphincter tone. Anus and perineum without scarring or rashes. No rectal masses are appreciated. Prostate is approximately 45 grams, nno nodules are appreciated. Seminal vesicles are normal. Skin: No rashes, bruises or suspicious lesions. Lymph: No cervical or inguinal adenopathy. Neurologic: Grossly intact, no focal deficits, moving all 4 extremities. Psychiatric: Normal mood and affect.    Laboratory Data: PSA History:  7.3 ng/mL on 00/00/2013  9.4 ng/mL on 08/00/2013  8.3 ng/mL on 02/00/2014 11.3 ng/mL on 09/00/2014- Patient was started on ADT and Xgeva  2.2 ng/mL on 09/13/2013 13.3 ng/mL on 03/18/2014-Patient had discontinued ADT    2.0 ng/mL on 05/00/2016    1.2 ng/ml on 12/30/2014     0.6 ng/mL in 07/2015 - patient discontinued ADT     8.7 ng/mL  in 01/2016    13.4 ng/mL in 02/2016    1.10 ng/mL in 05/2016  0.86 in 08/2016 - restarted Lupron 0.45 in 11/2016  Assessment & Plan:    1. Prostate cancer:    - TURP in 2009 found inverted papilloma. TRUSPBx of prostate 11/2008 noted a Gleason's 6 (3+3) in one core with a PSA of 5.5 ng/mL and a prostate volume of 50 cc   - Lupron 6 month shot given today  - He will RTC in 6 months forI PSS, Lupron and exam.     2.  Secondary malignancy to the bone  - followed by Cancer center by Dr. Janese Banks- PSA continues to respond to ADT therapy  - AST, ALT and Alk phos are elevated repeated blood work to be performed in 02/2017 by Dr. Janese Banks    Return in about 6 months (around 08/24/2017) for I PSS, exam and Lupron.  [name], [facility] [address], [place], [place] [address] [phone]

## 2017-02-24 ENCOUNTER — Encounter: Payer: Self-pay | Admitting: Urology

## 2017-02-24 ENCOUNTER — Ambulatory Visit (INDEPENDENT_AMBULATORY_CARE_PROVIDER_SITE_OTHER): Payer: BLUE CROSS/BLUE SHIELD | Admitting: Urology

## 2017-02-24 VITALS — BP 118/71 | HR 66 | Ht 67.0 in | Wt 168.0 lb

## 2017-02-24 DIAGNOSIS — C7951 Secondary malignant neoplasm of bone: Secondary | ICD-10-CM

## 2017-02-24 DIAGNOSIS — C61 Malignant neoplasm of prostate: Secondary | ICD-10-CM | POA: Diagnosis not present

## 2017-02-24 MED ORDER — LEUPROLIDE ACETATE (6 MONTH) 45 MG IM KIT
45.0000 mg | PACK | Freq: Once | INTRAMUSCULAR | Status: AC
  Administered 2017-02-24: 45 mg via INTRAMUSCULAR

## 2017-02-24 NOTE — Progress Notes (Signed)
Lupron IM Injection   Due to Prostate Cancer patient is present today for a Lupron Injection.  Medication: Lupron 6 month Dose: 45 mg  Location: left upper outer buttocks Lot: [id number] Exp: 06/13/2019  Patient tolerated well, no complications were noted  Performed by: Toniann Fail, LPN

## 2017-03-07 DIAGNOSIS — H40003 Preglaucoma, unspecified, bilateral: Secondary | ICD-10-CM | POA: Diagnosis not present

## 2017-03-11 ENCOUNTER — Inpatient Hospital Stay: Payer: Medicare Other | Attending: Oncology

## 2017-03-11 DIAGNOSIS — K219 Gastro-esophageal reflux disease without esophagitis: Secondary | ICD-10-CM | POA: Diagnosis not present

## 2017-03-11 DIAGNOSIS — R9721 Rising PSA following treatment for malignant neoplasm of prostate: Secondary | ICD-10-CM | POA: Diagnosis not present

## 2017-03-11 DIAGNOSIS — E042 Nontoxic multinodular goiter: Secondary | ICD-10-CM | POA: Insufficient documentation

## 2017-03-11 DIAGNOSIS — C7951 Secondary malignant neoplasm of bone: Secondary | ICD-10-CM | POA: Insufficient documentation

## 2017-03-11 DIAGNOSIS — Z87891 Personal history of nicotine dependence: Secondary | ICD-10-CM | POA: Diagnosis not present

## 2017-03-11 DIAGNOSIS — C61 Malignant neoplasm of prostate: Secondary | ICD-10-CM

## 2017-03-11 DIAGNOSIS — Z79899 Other long term (current) drug therapy: Secondary | ICD-10-CM | POA: Insufficient documentation

## 2017-03-11 DIAGNOSIS — E785 Hyperlipidemia, unspecified: Secondary | ICD-10-CM | POA: Diagnosis not present

## 2017-03-11 DIAGNOSIS — R740 Nonspecific elevation of levels of transaminase and lactic acid dehydrogenase [LDH]: Secondary | ICD-10-CM | POA: Diagnosis not present

## 2017-03-11 LAB — COMPREHENSIVE METABOLIC PANEL
ALBUMIN: 4 g/dL (ref 3.5–5.0)
ALT: 60 U/L (ref 17–63)
AST: 40 U/L (ref 15–41)
Alkaline Phosphatase: 107 U/L (ref 38–126)
Anion gap: 7 (ref 5–15)
BILIRUBIN TOTAL: 0.5 mg/dL (ref 0.3–1.2)
BUN: 13 mg/dL (ref 6–20)
CHLORIDE: 106 mmol/L (ref 101–111)
CO2: 28 mmol/L (ref 22–32)
Calcium: 9.3 mg/dL (ref 8.9–10.3)
Creatinine, Ser: 0.93 mg/dL (ref 0.61–1.24)
GFR calc Af Amer: >60 mL/min (ref >60)
GFR calc non Af Amer: >60 mL/min (ref >60)
GLUCOSE: 135 mg/dL — AB (ref 65–99)
POTASSIUM: 3.4 mmol/L — AB (ref 3.5–5.1)
SODIUM: 141 mmol/L (ref 135–145)
Total Protein: 6.9 g/dL (ref 6.5–8.1)

## 2017-03-11 LAB — CBC WITH DIFFERENTIAL/PLATELET
Basophils Absolute: 0 10*3/uL (ref 0–0.1)
Basophils Relative: 0 %
EOS PCT: 4 %
Eosinophils Absolute: 0.2 10*3/uL (ref 0–0.7)
HCT: 45 % (ref 40.0–52.0)
Hemoglobin: 14.9 g/dL (ref 13.0–18.0)
LYMPHS PCT: 32 %
Lymphs Abs: 1.5 10*3/uL (ref 1.0–3.6)
MCH: 30.6 pg (ref 26.0–34.0)
MCHC: 33.2 g/dL (ref 32.0–36.0)
MCV: 92.1 fL (ref 80.0–100.0)
MONO ABS: 0.5 10*3/uL (ref 0.2–1.0)
MONOS PCT: 10 %
Neutro Abs: 2.6 10*3/uL (ref 1.4–6.5)
Neutrophils Relative %: 54 %
PLATELETS: 212 10*3/uL (ref 150–440)
RBC: 4.89 MIL/uL (ref 4.40–5.90)
RDW: 13.4 % (ref 11.5–14.5)
WBC: 4.8 10*3/uL (ref 3.8–10.6)

## 2017-03-11 LAB — PSA: Prostatic Specific Antigen: 0.48 ng/mL (ref 0.00–4.00)

## 2017-03-13 DIAGNOSIS — H401132 Primary open-angle glaucoma, bilateral, moderate stage: Secondary | ICD-10-CM | POA: Diagnosis not present

## 2017-03-24 DIAGNOSIS — M889 Osteitis deformans of unspecified bone: Secondary | ICD-10-CM | POA: Diagnosis not present

## 2017-03-24 DIAGNOSIS — J45909 Unspecified asthma, uncomplicated: Secondary | ICD-10-CM | POA: Diagnosis not present

## 2017-03-24 DIAGNOSIS — J209 Acute bronchitis, unspecified: Secondary | ICD-10-CM | POA: Diagnosis not present

## 2017-03-24 DIAGNOSIS — M5093 Cervical disc disorder, unspecified, cervicothoracic region: Secondary | ICD-10-CM | POA: Diagnosis not present

## 2017-04-02 DIAGNOSIS — M889 Osteitis deformans of unspecified bone: Secondary | ICD-10-CM | POA: Diagnosis not present

## 2017-04-02 DIAGNOSIS — K828 Other specified diseases of gallbladder: Secondary | ICD-10-CM | POA: Diagnosis not present

## 2017-04-02 DIAGNOSIS — M5093 Cervical disc disorder, unspecified, cervicothoracic region: Secondary | ICD-10-CM | POA: Diagnosis not present

## 2017-04-02 DIAGNOSIS — M76891 Other specified enthesopathies of right lower limb, excluding foot: Secondary | ICD-10-CM | POA: Diagnosis not present

## 2017-04-24 DIAGNOSIS — H401132 Primary open-angle glaucoma, bilateral, moderate stage: Secondary | ICD-10-CM | POA: Diagnosis not present

## 2017-06-10 ENCOUNTER — Other Ambulatory Visit: Payer: Medicare Other

## 2017-06-10 ENCOUNTER — Ambulatory Visit: Payer: Medicare Other | Admitting: Oncology

## 2017-06-20 ENCOUNTER — Other Ambulatory Visit: Payer: Self-pay

## 2017-06-20 ENCOUNTER — Encounter: Payer: Self-pay | Admitting: Oncology

## 2017-06-20 ENCOUNTER — Inpatient Hospital Stay: Payer: Medicare Other | Attending: Oncology

## 2017-06-20 ENCOUNTER — Inpatient Hospital Stay (HOSPITAL_BASED_OUTPATIENT_CLINIC_OR_DEPARTMENT_OTHER): Payer: Medicare Other | Admitting: Oncology

## 2017-06-20 VITALS — BP 109/69 | HR 56 | Temp 97.8°F | Resp 18 | Ht 67.0 in | Wt 161.8 lb

## 2017-06-20 DIAGNOSIS — C61 Malignant neoplasm of prostate: Secondary | ICD-10-CM | POA: Insufficient documentation

## 2017-06-20 DIAGNOSIS — C7951 Secondary malignant neoplasm of bone: Secondary | ICD-10-CM

## 2017-06-20 LAB — CBC WITH DIFFERENTIAL/PLATELET
BASOS ABS: 0.1 10*3/uL (ref 0–0.1)
Basophils Relative: 2 %
EOS PCT: 5 %
Eosinophils Absolute: 0.2 10*3/uL (ref 0–0.7)
HCT: 44.8 % (ref 40.0–52.0)
Hemoglobin: 15.1 g/dL (ref 13.0–18.0)
LYMPHS PCT: 36 %
Lymphs Abs: 1.6 10*3/uL (ref 1.0–3.6)
MCH: 30.8 pg (ref 26.0–34.0)
MCHC: 33.6 g/dL (ref 32.0–36.0)
MCV: 91.5 fL (ref 80.0–100.0)
MONO ABS: 0.5 10*3/uL (ref 0.2–1.0)
Monocytes Relative: 11 %
Neutro Abs: 2.1 10*3/uL (ref 1.4–6.5)
Neutrophils Relative %: 46 %
PLATELETS: 213 10*3/uL (ref 150–440)
RBC: 4.9 MIL/uL (ref 4.40–5.90)
RDW: 13.6 % (ref 11.5–14.5)
WBC: 4.5 10*3/uL (ref 3.8–10.6)

## 2017-06-20 LAB — COMPREHENSIVE METABOLIC PANEL
ALBUMIN: 4.1 g/dL (ref 3.5–5.0)
ALK PHOS: 73 U/L (ref 38–126)
ALT: 16 U/L — ABNORMAL LOW (ref 17–63)
AST: 23 U/L (ref 15–41)
Anion gap: 8 (ref 5–15)
BUN: 14 mg/dL (ref 6–20)
CALCIUM: 9.4 mg/dL (ref 8.9–10.3)
CO2: 25 mmol/L (ref 22–32)
Chloride: 105 mmol/L (ref 101–111)
Creatinine, Ser: 0.92 mg/dL (ref 0.61–1.24)
GFR calc Af Amer: >60 mL/min (ref >60)
GFR calc non Af Amer: >60 mL/min (ref >60)
GLUCOSE: 134 mg/dL — AB (ref 65–99)
Potassium: 3.9 mmol/L (ref 3.5–5.1)
Sodium: 138 mmol/L (ref 135–145)
TOTAL PROTEIN: 6.9 g/dL (ref 6.5–8.1)
Total Bilirubin: 0.7 mg/dL (ref 0.3–1.2)

## 2017-06-20 LAB — PSA: Prostatic Specific Antigen: 0.36 ng/mL (ref 0.00–4.00)

## 2017-06-20 NOTE — Progress Notes (Signed)
No new changes noted today 

## 2017-06-23 NOTE — Progress Notes (Signed)
Hematology/Oncology Consult note 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 Brock Ra November 2014. In November 2010 he had a prostate biopsy when his PSA was going up to 5.5 and pathology showed adenocarcinoma Gleason score 3+3. Bone scan in October 2014 showed 3 areas of suspicious metastatic disease. Patient also has Paget's disease of the bone and has received Reclast from Dr.Morayati in the past. Patient had been receiving Lupron shots with urology every 6 months and his last dose was in January 2018. PSA increased to 8.7 in January 2018 from a value of 0.68 months ago. In February for the increased 13.4. Patient had a repeat bone scan in February 2018 which showed increased activity in the midthoracic and lower lumbar spine as well as the right shoulder and other joints of visit this and in the right ilium and left ischium which was compared to the bone scan from last year and was concerning for recurrent bony metastases. 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 Nodular opacities in the left upper lobe, largest measuring 7 mm. The small nodular lesions potentially could represent metastatic foci. No larger lesions evident. No adenopathy. There is multinodular goiter without dominant thyroid mass. There are scattered foci of  atherosclerotic calcification including scattered foci of coronary artery calcification. There is a degree of spontaneous gastroesophageal reflux.  CT abdomen and pelvis: Bony metastatic disease, sclerotic in character, throughout the pelvis. Suspect combination of bony metastatic disease and Paget's disease in the proximal right femur.  No adenopathy. No focal liver lesions beyond hepatic steatosis.  No bowel wall thickening or bowel obstruction. No abscess. Appendix appears normal. No renal or ureteral calculus. No hydronephrosis.   PSA has responded well to ADT and continues to trend down  Interval history- Patient is tolerating his lupron shots well other than midl self limited hot flashes. He continues to live alone and is independent of His ADL's and IADL's. His appetite is good. Denies any unintentional weight loss. Denies any new aches pains anywhere  ECOG PS- 0 Pain scale- 0   Review of systems- Review of Systems  Constitutional: Negative for chills, fever, malaise/fatigue and weight loss.  HENT: Negative for congestion, ear discharge and nosebleeds.   Eyes: Negative for blurred vision.  Respiratory: Negative for cough, hemoptysis, sputum production, shortness of breath and wheezing.   Cardiovascular: Negative for chest pain, palpitations, orthopnea and claudication.  Gastrointestinal: Negative for abdominal pain, blood in stool, constipation, diarrhea, heartburn, melena, nausea and vomiting.  Genitourinary: Negative for dysuria, flank pain, frequency, hematuria and urgency.  Musculoskeletal: Negative for back pain, joint pain and myalgias.  Skin: Negative for rash.  Neurological: Negative for dizziness, tingling, focal weakness, seizures, weakness and headaches.  Endo/Heme/Allergies: Does not bruise/bleed easily.       Hot flashes  Psychiatric/Behavioral: Negative for depression and suicidal ideas. The patient does not have insomnia.       No Known  Allergies   Past Medical History:  Diagnosis Date  . Adenocarcinoma of prostate ([place])   . BPH with obstruction/lower urinary tract symptoms   . HLD (hyperlipidemia)   . Paget disease of bone      Past Surgical History:  Procedure Laterality Date  . CHOLECYSTECTOMY    . PROSTATE BIOPSY    . PROSTATE SURGERY      Social History   Socioeconomic History  . Marital status: Divorced    Spouse name: Not on file  . Number of children: Not on file  . Years of education: Not on file  . Highest education level: Not on file  Occupational History  . Not on file  Social Needs  . Financial resource strain: Not on file  . Food insecurity:    Worry: Not on file    Inability: Not on file  . Transportation needs:    Medical: Not on file    Non-medical: Not on file  Tobacco Use  . Smoking status: Former Research scientist (life sciences)  . Smokeless tobacco: Never Used  . Tobacco comment: quit 50 years ago  Substance and Sexual Activity  . Alcohol use: No    Alcohol/week: 0.0 oz  . Drug use: No  . Sexual activity: Not on file  Lifestyle  . Physical activity:    Days per week: Not on file    Minutes per session: Not on file  . Stress: Not on file  Relationships  . Social connections:    Talks on phone: Not on file    Gets together: Not on file    Attends religious service: Not on file    Active member of club or organization: Not on file    Attends meetings of clubs or organizations: Not on file    Relationship status: Not on file  . Intimate partner violence:    Fear of current or ex partner: Not on file    Emotionally abused: Not on file    Physically abused: Not on file    Forced sexual activity: Not on file  Other Topics Concern  . Not on file  Social History Narrative  . Not on file    Family History  Problem Relation Age of Onset  . Cancer Son        melanoma  . Breast cancer Sister   . Cancer Brother   . Kidney disease Neg Hx   . Prostate cancer Neg Hx      Current Outpatient  Medications:  .  aspirin 81 MG tablet, Take 81 mg by mouth daily., Disp: , Rfl:  .  cholecalciferol (VITAMIN D) 1000 UNITS tablet, Take 1,000 Units by mouth daily., Disp: , Rfl:  .  latanoprost (XALATAN) 0.005 % ophthalmic solution, place 1 drop into both eyes at bedtime, Disp: , Rfl: 0 .  lovastatin (MEVACOR) 40 MG tablet, Take 40 mg by mouth at bedtime., Disp: , Rfl:  .  Misc Natural Products (PROSTATE HEALTH PO), Take by mouth., Disp: , Rfl:  .  Multiple Vitamin (MULTIVITAMIN) tablet, Take 1 tablet by mouth daily., Disp: , Rfl:  .  Leuprolide Acetate, 6 Month, (LUPRON DEPOT, 22-MONTH,) 45 MG injection, Inject 45 mg into the muscle every 6 (six) months., Disp: , Rfl:   Physical exam:  Vitals:   06/20/17 1428  BP: 109/69  Pulse: (!) 56  Resp: 18  Temp: 97.8 F (36.6 C)  TempSrc: Tympanic  SpO2: 96%  Weight: 161 lb 12.8 oz (73.4 kg)  Height: 5\' 7"  (  1.702 m)   Physical Exam  Constitutional: He is oriented to person, place, and time.  HENT:  Head: Normocephalic and atraumatic.  Eyes: Pupils are equal, round, and reactive to light. EOM are normal.  Neck: Normal range of motion.  Cardiovascular: Normal rate, regular rhythm and normal heart sounds.  Pulmonary/Chest: Effort normal and breath sounds normal.  Abdominal: Soft. Bowel sounds are normal.  Neurological: He is alert and oriented to person, place, and time.  Skin: Skin is warm and dry.     CMP Latest Ref Rng & Units 06/20/2017  Glucose 65 - 99 mg/dL 134(H)  BUN 6 - 20 mg/dL 14  Creatinine 0.61 - 1.24 mg/dL 0.92  Sodium 135 - 145 mmol/L 138  Potassium 3.5 - 5.1 mmol/L 3.9  Chloride 101 - 111 mmol/L 105  CO2 22 - 32 mmol/L 25  Calcium 8.9 - 10.3 mg/dL 9.4  Total Protein 6.5 - 8.1 g/dL 6.9  Total Bilirubin 0.3 - 1.2 mg/dL 0.7  Alkaline Phos 38 - 126 U/L 73  AST 15 - 41 U/L 23  ALT 17 - 63 U/L 16(L)   CBC Latest Ref Rng & Units 06/20/2017  WBC 3.8 - 10.6 K/uL 4.5  Hemoglobin 13.0 - 18.0 g/dL 15.1  Hematocrit 40.0 - 52.0  % 44.8  Platelets 150 - 440 K/uL 213     Assessment and plan- Patient is a 84 y.o. 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 His PSA continues to trend down and is down to 0.36 today. He has castrate sensitive disease with low volume bone mets and is on ADT only through urology. Given that he continues to respond to ADT, it would be ok for patient to follow up with urology only at this time and get PSA checked Q3 months. Imaging only if clinically concerning signs/ symptoms or rising PSA  He can be sent back to [place] in the future if there is evidence of castrate resistant disease.      Visit Diagnosis 1. Malignant neoplasm of prostate ([place])   2. Secondary malignant neoplasm of bone ([place])      Dr. Randa Evens, MD, MPH [facility] [phone] 06/23/2017 8:36 AM

## 2017-07-02 DIAGNOSIS — M5093 Cervical disc disorder, unspecified, cervicothoracic region: Secondary | ICD-10-CM | POA: Diagnosis not present

## 2017-07-02 DIAGNOSIS — C61 Malignant neoplasm of prostate: Secondary | ICD-10-CM | POA: Diagnosis not present

## 2017-07-02 DIAGNOSIS — M818 Other osteoporosis without current pathological fracture: Secondary | ICD-10-CM | POA: Diagnosis not present

## 2017-07-02 DIAGNOSIS — M76891 Other specified enthesopathies of right lower limb, excluding foot: Secondary | ICD-10-CM | POA: Diagnosis not present

## 2017-08-22 DIAGNOSIS — H401132 Primary open-angle glaucoma, bilateral, moderate stage: Secondary | ICD-10-CM | POA: Diagnosis not present

## 2017-09-03 NOTE — Progress Notes (Signed)
8:[place] June 26, 1928 [id number]  Referring provider: Cletis Athens, MD [place], [place] [address]  Chief Complaint  Patient presents with  . Prostate Cancer    27month    HPI: Patient is an 84 year old AAM with metastatic prostate cancer who presents today for his 6 month follow up.    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    Bone scan 08/2016 noted abnormal uptake throughout the sacrum is consistent with Paget's disease as seen on the comparison CT scans. Punctate sclerotic foci in the sacrum on CT scan are highly suspicious for metastatic disease and obscured on this exam.   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  Increased activity in the midthoracic spine correlates with degenerative disease and endplate sclerosis at X9-1. Punctate sclerotic focus in T10 on CT scan worrisome for metastatic disease is not visible on this exam.  Mildly increased uptake in the mid shafts of the femurs bilaterally could be due to metastatic disease.  Contrast CT noted coarsened trabeculation and fairly classic appearance for Paget's disease involving the pelvis, right proximal femur, and T7 vertebral body, essentially stable from 2009. I am skeptical of superimposed metastatic disease in these regions.  Superior endplate sclerosis at T8 appears to be geode degenerative. Small sclerotic lesions in the T10 vertebral body are stable from 2009 an while these could be from old prostate metastatic disease they could also simply be bone islands.  Faint makes density along the inferior tip of the scapula is nonspecific, and likely merit surveillance. However, this area does not appear to have significant asymmetric activity on today' s bone scan.  The left upper lobe  pulmonary nodules of concern have been stable since 2012, and are likely benign. No adenopathy in the chest, abdomen, or pelvis.  Other imaging findings of potential clinical significance: Aortic Atherosclerosis (ICD10-I70.0) and Emphysema (ICD10-J43.9).  Degenerative glenohumeral arthropathy bilaterally. Mildly elevated right hemidiaphragm with chronic adjacent volume loss. Prostate gland measures about 40 cubic cm. Lumbar spondylosis and degenerative disc disease causing lower lumbar impingement.  Last Lupron received on 02/2017.    His main complaint today is bilateral breast enlargement.  He states he feels like a "sissy."  He is also having night sweats and weight loss.  He has a good appetite.  He is taking a "whole lot of vitamins."  One is called Bone Up, so one hopes it is calcium. Patient denies any gross hematuria, dysuria or suprapubic/flank pain.  Patient denies any fevers, chills, nausea or vomiting.   PMH: Past Medical History:  Diagnosis Date  . Adenocarcinoma of prostate ([place])   . BPH with obstruction/lower urinary tract symptoms   . HLD (hyperlipidemia)   . Paget disease of bone     Surgical History: Past Surgical History:  Procedure Laterality Date  . CHOLECYSTECTOMY    . PROSTATE BIOPSY    . PROSTATE SURGERY      Home Medications:  Allergies as of 09/04/2017   No Known Allergies     Medication List        Accurate as of 09/04/17  8:57 AM. Always use your most recent med list.          aspirin 81 MG tablet Take  81 mg by mouth daily.   cholecalciferol 1000 units tablet Commonly known as:  VITAMIN D Take 1,000 Units by mouth daily.   latanoprost 0.005 % ophthalmic solution Commonly known as:  XALATAN place 1 drop into both eyes at bedtime   lovastatin 40 MG tablet Commonly known as:  MEVACOR Take 40 mg by mouth at bedtime.   LUPRON DEPOT (67-MONTH) 45 MG injection Generic drug:  Leuprolide Acetate (6 Month) Inject 45 mg into the muscle every 6  (six) months.   multivitamin tablet Take 1 tablet by mouth daily.   PROSTATE HEALTH PO Take by mouth.       Allergies: No Known Allergies  Family History: Family History  Problem Relation Age of Onset  . Cancer Son        melanoma  . Breast cancer Sister   . Cancer Brother   . Kidney disease Neg Hx   . Prostate cancer Neg Hx     Social History:  reports that he has quit smoking. He has never used smokeless tobacco. He reports that he does not drink alcohol or use drugs.  ROS: UROLOGY Frequent Urination?: No Hard to postpone urination?: No Burning/pain with urination?: No Get up at night to urinate?: No Leakage of urine?: No Urine stream starts and stops?: No Trouble starting stream?: No Do you have to strain to urinate?: No Blood in urine?: No Urinary tract infection?: No Sexually transmitted disease?: No Injury to kidneys or bladder?: No Painful intercourse?: No Weak stream?: No Erection problems?: No Penile pain?: No  Gastrointestinal Nausea?: No Vomiting?: No Indigestion/heartburn?: No Diarrhea?: No Constipation?: No  Constitutional Fever: No Night sweats?: Yes Weight loss?: Yes Fatigue?: No  Skin Skin rash/lesions?: No Itching?: No  Eyes Blurred vision?: No Double vision?: No  Ears/Nose/Throat Sore throat?: No Sinus problems?: No  Hematologic/Lymphatic Swollen glands?: No Easy bruising?: No  Cardiovascular Leg swelling?: No Chest pain?: No  Respiratory Cough?: No Shortness of breath?: No  Endocrine Excessive thirst?: No  Musculoskeletal Back pain?: Yes Joint pain?: No  Neurological Headaches?: No Dizziness?: No  Psychologic Depression?: No Anxiety?: No  Physical Exam: BP 120/76   Pulse (!) 57   Ht 5\' 6"  (1.676 m)   Wt 165 lb (74.8 kg)   BMI 26.63 kg/m   Constitutional: Well nourished. Alert and oriented, No acute distress. HEENT: [place] AT, moist mucus membranes. Trachea midline, no masses. Cardiovascular: No  clubbing, cyanosis, or edema. Respiratory: Normal respiratory effort, no increased work of breathing. Skin: No rashes, bruises or suspicious lesions. Lymph: No cervical or inguinal adenopathy. Neurologic: Grossly intact, no focal deficits, moving all 4 extremities. Psychiatric: Normal mood and affect.  Laboratory Data: PSA History:   7.3 ng/mL on 00/00/2013   9.4 ng/mL on 08/00/2013   8.3 ng/mL on 02/00/2014  11.3 ng/mL on 09/00/2014- Patient was started on ADT and Xgeva   2.2 ng/mL on 09/13/2013  13.3 ng/mL on 03/18/2014-Patient had discontinued ADT     2.0 ng/mL on 05/00/2016     1.2 ng/ml on 12/30/2014      0.6 ng/mL in 07/2015 - patient discontinued ADT      8.7 ng/mL in 01/2016     13.4 ng/mL in 02/2016     1.10 ng/mL in 05/2016  0.86 in 08/2016 - restarted Lupron  0.45 in 11/2016  0.48 in 02/2017  0.36 in 06/2017  I have reviewed the labs.  Assessment & Plan:    1. Prostate cancer PSA drawn today  Continue Lupron  Lupron injection given today - RTC in 6 months for Lupron, PSA and office visit  RTC in 3 months for PSA only   2. 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 Janese Banks in 06/2017 and he has been released back into our care with recommendations of checking PSA q 3 months/repeat imaging only if worsening clinical signs or rising PSA  3. Gynecomastia Explain to the patient that is it likely the side effect of the Lupron  He wanted to discontinue the Lupron, but I advised him that the breast tissue will not go away and it would cause his prostate cancer to flair He declined a referral to general surgery at this time or a mammogram    Return in about 3 months (around 12/05/2017) for PSA only .  [name], PA-C  [facility] [address] [place], [place] [address] [phone]

## 2017-09-04 ENCOUNTER — Ambulatory Visit (INDEPENDENT_AMBULATORY_CARE_PROVIDER_SITE_OTHER): Payer: Medicare Other | Admitting: Urology

## 2017-09-04 ENCOUNTER — Encounter: Payer: Self-pay | Admitting: Urology

## 2017-09-04 VITALS — BP 120/76 | HR 57 | Ht 66.0 in | Wt 165.0 lb

## 2017-09-04 DIAGNOSIS — C7951 Secondary malignant neoplasm of bone: Secondary | ICD-10-CM

## 2017-09-04 DIAGNOSIS — N62 Hypertrophy of breast: Secondary | ICD-10-CM

## 2017-09-04 DIAGNOSIS — C61 Malignant neoplasm of prostate: Secondary | ICD-10-CM

## 2017-09-04 MED ORDER — LEUPROLIDE ACETATE (6 MONTH) 45 MG IM KIT
45.0000 mg | PACK | Freq: Once | INTRAMUSCULAR | Status: AC
  Administered 2017-09-04: 45 mg via INTRAMUSCULAR

## 2017-09-04 NOTE — Progress Notes (Signed)
Lupron IM Injection   Due to Prostate Cancer patient is present today for a Lupron Injection.  Medication: Lupron 6 month Dose: 45 mg  Location: left upper outer buttocks Lot: [id number] Exp: 10/13/2019  Patient tolerated well, no complications were noted  Performed by: Cristie Hem, CMA  Follow up: As Scheduled

## 2017-09-05 ENCOUNTER — Telehealth: Payer: Self-pay

## 2017-09-05 LAB — PSA: PROSTATE SPECIFIC AG, SERUM: 0.5 ng/mL (ref 0.0–4.0)

## 2017-09-05 NOTE — Telephone Encounter (Signed)
Patient returned the call All questions answered Dean White

## 2017-09-05 NOTE — Telephone Encounter (Signed)
Left message for pt to call office

## 2017-09-05 NOTE — Telephone Encounter (Signed)
-----   Message from Nori Riis, PA-C sent at 09/05/2017  9:24 AM EDT ----- Please let Mr. Gassmann know that his PSA is now at 0.5, which is excellent.

## 2017-10-01 DIAGNOSIS — Z23 Encounter for immunization: Secondary | ICD-10-CM | POA: Diagnosis not present

## 2017-10-01 DIAGNOSIS — M818 Other osteoporosis without current pathological fracture: Secondary | ICD-10-CM | POA: Diagnosis not present

## 2017-10-01 DIAGNOSIS — C61 Malignant neoplasm of prostate: Secondary | ICD-10-CM | POA: Diagnosis not present

## 2017-10-01 DIAGNOSIS — M5093 Cervical disc disorder, unspecified, cervicothoracic region: Secondary | ICD-10-CM | POA: Diagnosis not present

## 2017-10-01 DIAGNOSIS — M76891 Other specified enthesopathies of right lower limb, excluding foot: Secondary | ICD-10-CM | POA: Diagnosis not present

## 2017-12-02 ENCOUNTER — Other Ambulatory Visit: Payer: Self-pay

## 2017-12-02 DIAGNOSIS — C61 Malignant neoplasm of prostate: Secondary | ICD-10-CM

## 2017-12-03 ENCOUNTER — Other Ambulatory Visit: Payer: Medicare Other

## 2017-12-03 DIAGNOSIS — C61 Malignant neoplasm of prostate: Secondary | ICD-10-CM | POA: Diagnosis not present

## 2017-12-04 ENCOUNTER — Telehealth: Payer: Self-pay

## 2017-12-04 LAB — PSA: Prostate Specific Ag, Serum: 0.3 ng/mL (ref 0.0–4.0)

## 2017-12-04 NOTE — Telephone Encounter (Signed)
Attempted to reach pt via number on file. DPR does not list number that we have, did not leave detailed message. 1st attempt to reach pt, will try again 2 more times.

## 2017-12-04 NOTE — Telephone Encounter (Signed)
-----   Message from Nori Riis, PA-C sent at 12/04/2017  7:44 AM EST ----- Please let Mr. Pallone know that his PSA is stable at 0.3.  We will need to see him in 3 months with PSA prior to his appointment.

## 2017-12-08 NOTE — Telephone Encounter (Signed)
Left message for patient to call the office to give him his PSA results.

## 2017-12-30 DIAGNOSIS — R232 Flushing: Secondary | ICD-10-CM | POA: Diagnosis not present

## 2017-12-30 DIAGNOSIS — M5093 Cervical disc disorder, unspecified, cervicothoracic region: Secondary | ICD-10-CM | POA: Diagnosis not present

## 2017-12-30 DIAGNOSIS — M76891 Other specified enthesopathies of right lower limb, excluding foot: Secondary | ICD-10-CM | POA: Diagnosis not present

## 2017-12-30 DIAGNOSIS — C61 Malignant neoplasm of prostate: Secondary | ICD-10-CM | POA: Diagnosis not present

## 2017-12-30 DIAGNOSIS — Z0000 Encounter for general adult medical examination without abnormal findings: Secondary | ICD-10-CM | POA: Diagnosis not present

## 2018-01-19 DIAGNOSIS — M5093 Cervical disc disorder, unspecified, cervicothoracic region: Secondary | ICD-10-CM | POA: Diagnosis not present

## 2018-01-19 DIAGNOSIS — M76891 Other specified enthesopathies of right lower limb, excluding foot: Secondary | ICD-10-CM | POA: Diagnosis not present

## 2018-01-19 DIAGNOSIS — C61 Malignant neoplasm of prostate: Secondary | ICD-10-CM | POA: Diagnosis not present

## 2018-01-19 DIAGNOSIS — E559 Vitamin D deficiency, unspecified: Secondary | ICD-10-CM | POA: Diagnosis not present

## 2018-01-25 IMAGING — CT CT CHEST W/ CM
1 of 3 series · 10 of 32 positions shown, 13 images · IV contrast (iopamidol)
Comparison: CT abdomen and pelvis November 09, 2007

CLINICAL DATA: Metastatic prostate carcinoma

EXAM:
CT CHEST, ABDOMEN, AND PELVIS WITH CONTRAST
TECHNIQUE: Multidetector CT imaging of the chest, abdomen and pelvis was
performed following the standard protocol during bolus
administration of intravenous contrast. Oral contrast was also
administered.
CONTRAST:  100mL [id number] IOPAMIDOL ([id number]) INJECTION 61%

[Series 8: cap with. · axial · 0.71mm/px · z∈[-817,-337]mm · 10 of 121 slices shown, 13 images]
[im 13/121  mediastinal]
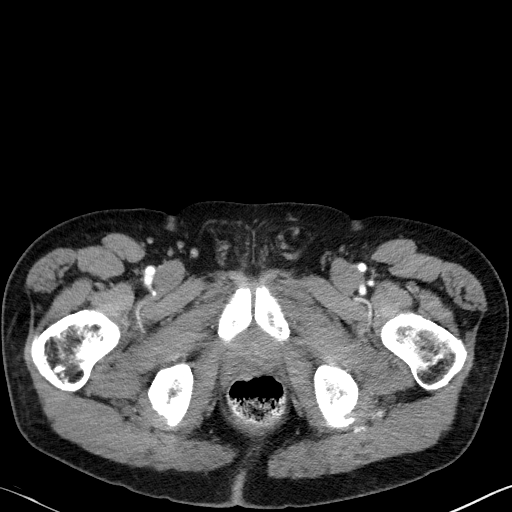
[im 13/121  lung]
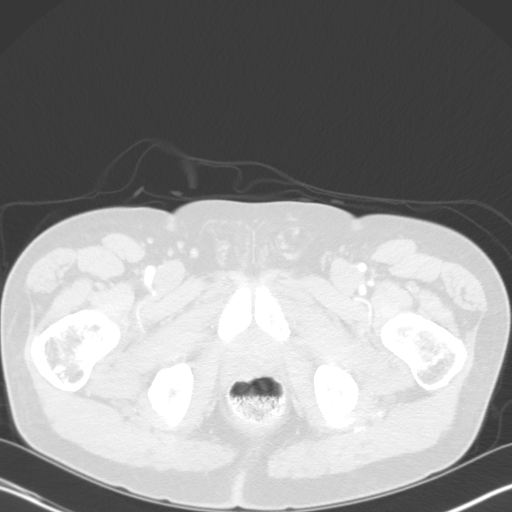
[im 25/121  lung]
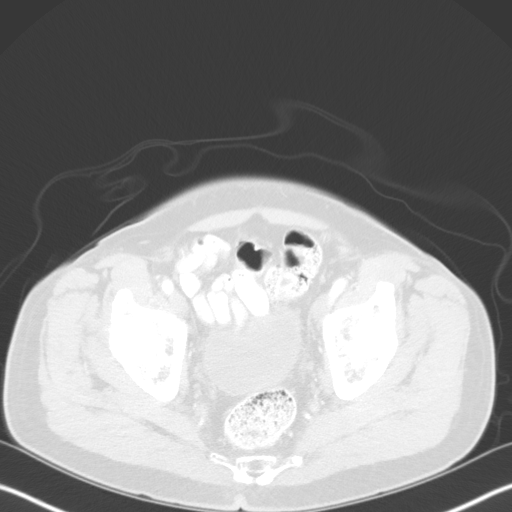
[im 37/121  lung]
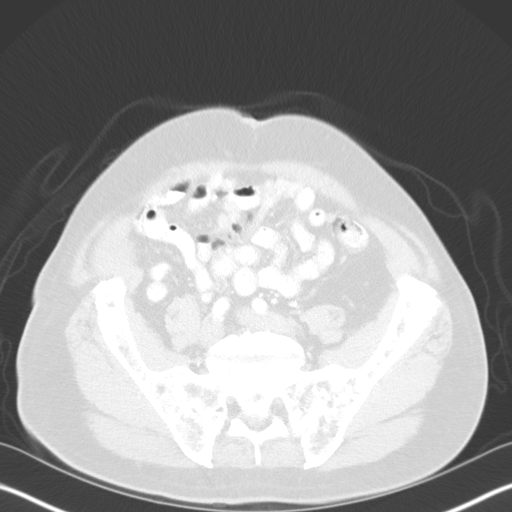
[im 49/121  lung]
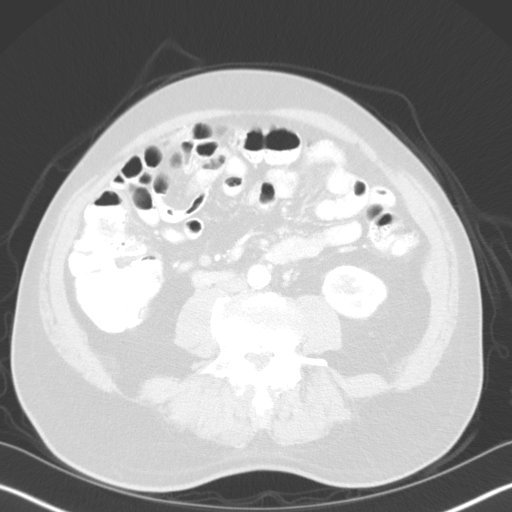
[im 59/121  mediastinal]
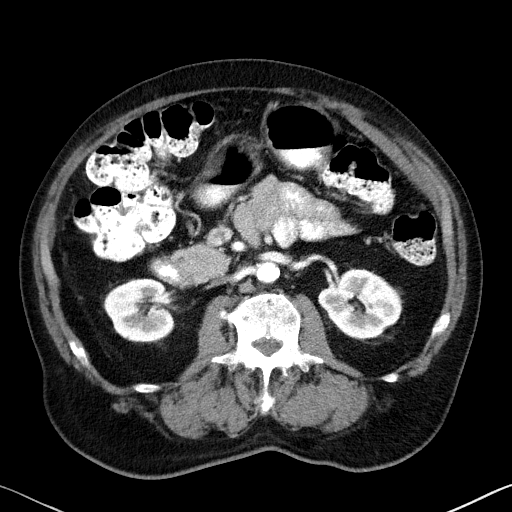
[im 59/121  lung]
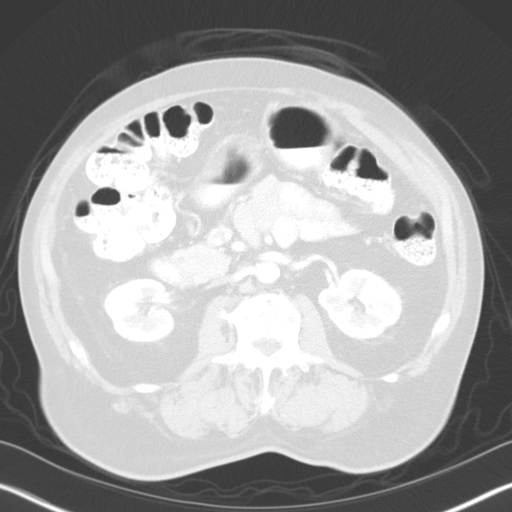
[im 61/121  lung]
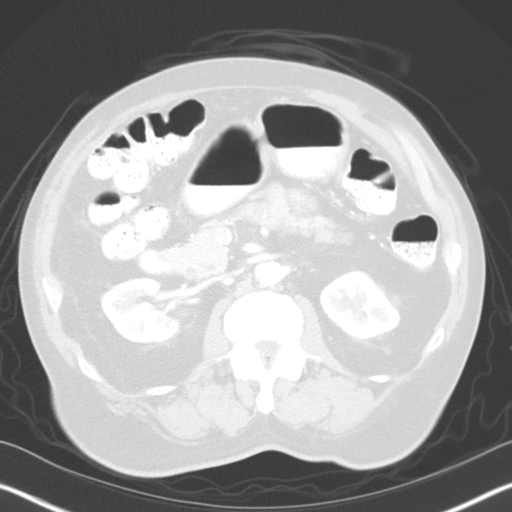
[im 73/121  lung]
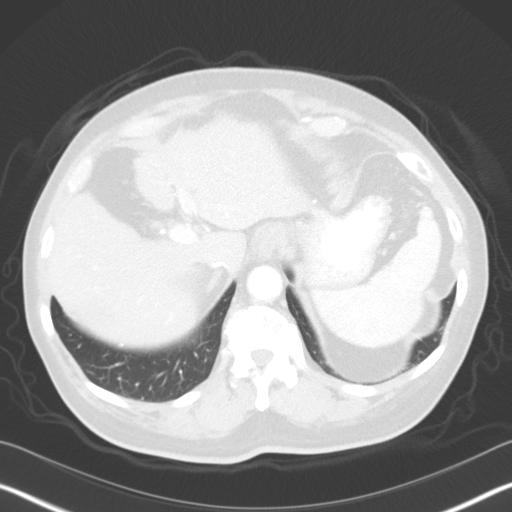
[im 85/121  lung]
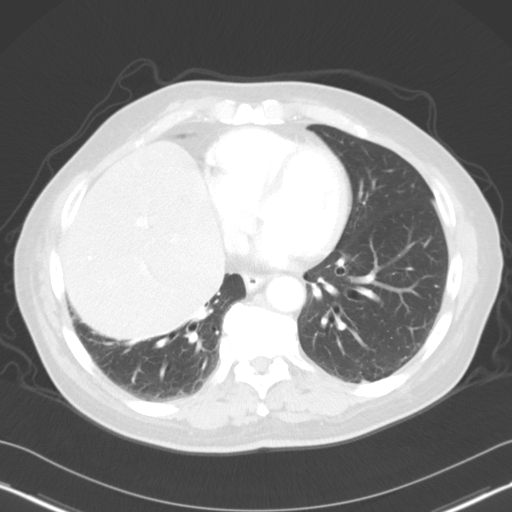
[im 97/121  mediastinal]
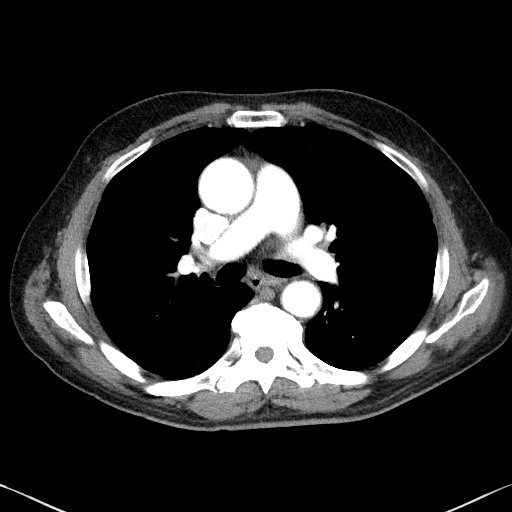
[im 97/121  lung]
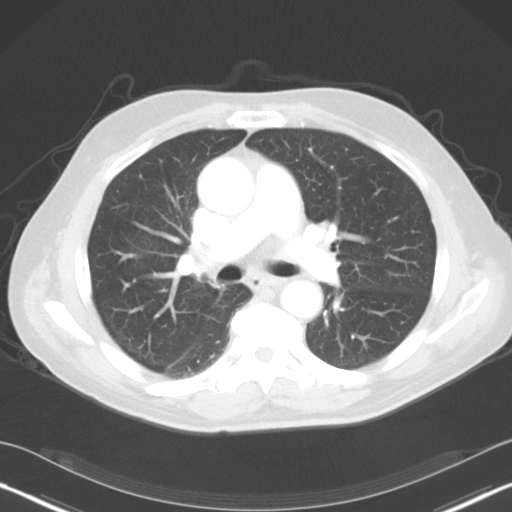
[im 109/121  lung]
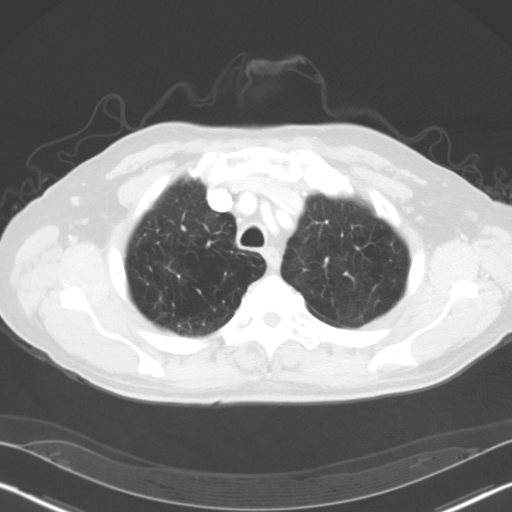

[10 of 32 positions shown; findings below may reference images not displayed]

FINDINGS: CT CHEST FINDINGS

Cardiovascular: There is no appreciable thoracic aortic aneurysm or
dissection. There is mild calcification at the origin of the left
subclavian artery. Other visualized great vessels appear normal.
There is atherosclerotic calcification in the aorta. Pericardium is
not appreciably thickened. There are scattered foci of coronary
artery calcification.

Mediastinum/Nodes: There are subcentimeter nodular lesions in the
thyroid consistent with multinodular goiter. No dominant thyroid
nodule lesion evident. There is no appreciable thoracic adenopathy.
There is reflux of oral contrast into the esophagus.

Lungs/Pleura: There is a degree of underlying centrilobular
emphysematous change. There is mild bibasilar scarring. There is
lower lobe bronchiectatic change bilaterally. On axial slice 34
series 13, there is a 7 x 6 mm nodular opacity in the medial aspect
of the posterior segment of the left upper lobe. On axial slice 91
series 13, there is a 5 mm nodular opacity abutting the pleura in
the superior segment left lower lobe. No other nodular opacities are
evident. No pleural effusion or pleural thickening is evident.

Musculoskeletal: There are sclerotic metastatic foci in the T7, T8,
and T10 vertebral bodies. There is sclerotic metastatic disease in
each scapula. There is sclerotic metastatic disease in the posterior
right fifth rib. There are no lytic or destructive lesions. There is
degenerative change in the thoracic spine.

CT ABDOMEN PELVIS FINDINGS

Hepatobiliary: There is persistent elevation of the right
hemidiaphragm. There is hepatic steatosis. No focal liver lesions
are demonstrable. Gallbladder is absent. There is no appreciable
biliary duct dilatation.

Pancreas: No pancreatic mass or inflammatory focus. Pancreatic duct
is upper normal in size.

Spleen: No splenic lesions are evident.

Adrenals/Urinary Tract: Adrenals appear unremarkable. There is an 8
x 8 mm cyst in the medial mid right kidney. There is no non cystic
renal mass on either side. There is no hydronephrosis. There is no
renal or ureteral calculus on either side. Urinary bladder is
midline with wall thickness within normal limits.

Stomach/Bowel: There is no appreciable bowel wall or mesenteric
thickening. No bowel obstruction. No free air or portal venous air.

Vascular/Lymphatic: There is atherosclerotic calcification in the
aorta and iliac arteries. There is no evident abdominal aortic
aneurysm. Major mesenteric vessels appear intact. There is no
appreciable adenopathy in the abdomen or pelvis.

Reproductive: The prostate is prominent. There is no extension of
known prostate tumor to the pelvic side walls. The seminal vesicles
appear unremarkable. There is no evidence of invasion of prostate
carcinoma into the urinary bladder, although the prostate abuts the
inferior wall of the bladder.

Other: Appendix appears normal. No ascites or abscess is evident in
the abdomen or pelvis. There are no evident omental lesions.

Musculoskeletal: There is widespread bony metastatic disease
throughout the pelvis. There are sclerotic foci in the proximal
right femur. There is also coarsening of the trabecula in this area
suggesting that there may be Paget's disease in this area. No
fracture. There is degenerative change in the lumbar spine. No lytic
or destructive bone lesions are evident. No intramuscular or
abdominal wall lesion evident.
IMPRESSION: CT chest: Bony metastatic disease noted. Nodular opacities in the
left upper lobe, largest measuring 7 mm. The small nodular lesions
potentially could represent metastatic foci. No larger lesions
evident. No adenopathy. There is multinodular goiter without
dominant thyroid mass. There are scattered foci of atherosclerotic
calcification including scattered foci of coronary artery
calcification. There is a degree of spontaneous gastroesophageal
reflux.

CT abdomen and pelvis: Bony metastatic disease, sclerotic in
character, throughout the pelvis. Suspect combination of bony
metastatic disease and Paget's disease in the proximal right femur.

No adenopathy.  No focal liver lesions beyond hepatic steatosis.

No bowel wall thickening or bowel obstruction. No abscess. Appendix
appears normal. No renal or ureteral calculus. No hydronephrosis.

## 2018-01-28 DIAGNOSIS — C61 Malignant neoplasm of prostate: Secondary | ICD-10-CM | POA: Diagnosis not present

## 2018-01-28 DIAGNOSIS — E559 Vitamin D deficiency, unspecified: Secondary | ICD-10-CM | POA: Diagnosis not present

## 2018-01-28 DIAGNOSIS — M76891 Other specified enthesopathies of right lower limb, excluding foot: Secondary | ICD-10-CM | POA: Diagnosis not present

## 2018-01-28 DIAGNOSIS — M5093 Cervical disc disorder, unspecified, cervicothoracic region: Secondary | ICD-10-CM | POA: Diagnosis not present

## 2018-02-23 DIAGNOSIS — H401132 Primary open-angle glaucoma, bilateral, moderate stage: Secondary | ICD-10-CM | POA: Diagnosis not present

## 2018-02-24 DIAGNOSIS — H401132 Primary open-angle glaucoma, bilateral, moderate stage: Secondary | ICD-10-CM | POA: Diagnosis not present

## 2018-03-24 ENCOUNTER — Telehealth: Payer: Self-pay | Admitting: Urology

## 2018-03-24 NOTE — Telephone Encounter (Signed)
Lab appointment scheduled for 04/01/18 at 9:30am. 3 month follow up scheduled for 04/03/18 at 11:30am.  Patient notified and appointment reminders mailed to his home address.

## 2018-03-24 NOTE — Telephone Encounter (Signed)
Please have Dean White schedule a follow up appointment for prostate cancer.

## 2018-03-31 ENCOUNTER — Other Ambulatory Visit: Payer: Self-pay

## 2018-03-31 DIAGNOSIS — M76891 Other specified enthesopathies of right lower limb, excluding foot: Secondary | ICD-10-CM | POA: Diagnosis not present

## 2018-03-31 DIAGNOSIS — M5093 Cervical disc disorder, unspecified, cervicothoracic region: Secondary | ICD-10-CM | POA: Diagnosis not present

## 2018-03-31 DIAGNOSIS — E559 Vitamin D deficiency, unspecified: Secondary | ICD-10-CM | POA: Diagnosis not present

## 2018-03-31 DIAGNOSIS — C61 Malignant neoplasm of prostate: Secondary | ICD-10-CM

## 2018-04-01 ENCOUNTER — Other Ambulatory Visit: Payer: Medicare HMO

## 2018-04-01 ENCOUNTER — Other Ambulatory Visit: Payer: Self-pay

## 2018-04-01 DIAGNOSIS — C61 Malignant neoplasm of prostate: Secondary | ICD-10-CM

## 2018-04-01 NOTE — Progress Notes (Incomplete)
04/03/2018 3:27 PM   [name] 1928-09-25 [id number]  Referring provider: Cletis Athens, MD [place], [place] [address]  No chief complaint on file.   HPI: Dean White is a 85 y.o. male African American with metastatic prostate cancer who presents today for his 3 *** month follow up.    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    Bone scan 08/2016 noted abnormal uptake throughout the sacrum is consistent with Paget's disease as seen on the comparison CT scans. Punctate sclerotic foci in the sacrum on CT scan are highly suspicious for metastatic disease and obscured on this exam.   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  Increased activity in the midthoracic spine correlates with degenerative disease and endplate sclerosis at K7-4. Punctate sclerotic focus in T10 on CT scan worrisome for metastatic disease is not visible on this exam.  Mildly increased uptake in the mid shafts of the femurs bilaterally could be due to metastatic disease.  Contrast CT noted coarsened trabeculation and fairly classic appearance for Paget's disease involving the pelvis, right proximal femur, and T7 vertebral body, essentially stable from 2009. I am skeptical of superimposed metastatic disease in these regions.  Superior endplate sclerosis at T8 appears to be geode degenerative. Small sclerotic lesions in the T10 vertebral body are stable from 2009 an while these could be from old prostate metastatic disease they could also simply be bone islands.  Faint makes density along the inferior tip of the scapula is nonspecific, and likely merit surveillance. However, this area does not appear to have significant asymmetric activity on today' s bone scan.  The left upper lobe pulmonary  nodules of concern have been stable since 2012, and are likely benign. No adenopathy in the chest, abdomen, or pelvis.  Other imaging findings of potential clinical significance: Aortic Atherosclerosis (ICD10-I70.0) and Emphysema (ICD10-J43.9).  Degenerative glenohumeral arthropathy bilaterally. Mildly elevated right hemidiaphragm with chronic adjacent volume loss. Prostate gland measures about 40 cubic cm. Lumbar spondylosis and degenerative disc disease causing lower lumbar impingement.  His main complaint on his 09/04/2017 visit was bilateral breast enlargement.  He stated that he feels like a "sissy."  He was also having night sweats and weight loss.  He had a good appetite.  He was taking a "whole lot of vitamins."  One was called Bone Up, so one hopes it is calcium.  Patient denied any gross hematuria, dysuria or suprapubic/flank pain.  Patient denied any fevers, chills, nausea or vomiting.  Patient received a Lupron injection on that visit.  ***  PMH: Past Medical History:  Diagnosis Date   Adenocarcinoma of prostate ([place])    BPH with obstruction/lower urinary tract symptoms    HLD (hyperlipidemia)    Paget disease of bone     Surgical History: Past Surgical History:  Procedure Laterality Date   CHOLECYSTECTOMY     PROSTATE BIOPSY     PROSTATE SURGERY      Home Medications:  Allergies as of 04/03/2018   No Known Allergies     Medication List       Accurate as of April 01, 2018  3:27 PM. Always use your most recent med list.        aspirin 81 MG  tablet Take 81 mg by mouth daily.   cholecalciferol 1000 units tablet Commonly known as:  VITAMIN D Take 1,000 Units by mouth daily.   latanoprost 0.005 % ophthalmic solution Commonly known as:  XALATAN place 1 drop into both eyes at bedtime   lovastatin 40 MG tablet Commonly known as:  MEVACOR Take 40 mg by mouth at bedtime.   Lupron Depot (50-Month) 45 MG injection Generic drug:  Leuprolide Acetate (6  Month) Inject 45 mg into the muscle every 6 (six) months.   multivitamin tablet Take 1 tablet by mouth daily.   PROSTATE HEALTH PO Take by mouth.       Allergies: No Known Allergies  Family History: Family History  Problem Relation Age of Onset   Cancer Son        melanoma   Breast cancer Sister    Cancer Brother    Kidney disease Neg Hx    Prostate cancer Neg Hx     Social History:  reports that he has quit smoking. He has never used smokeless tobacco. He reports that he does not drink alcohol or use drugs.  ROS:                                        Physical Exam: There were no vitals taken for this visit.  Constitutional:  Well nourished. Alert and oriented, No acute distress. {HEENT: [place] AT, moist mucus membranes.  Trachea midline, no masses.} Cardiovascular: No clubbing, cyanosis, or edema. Respiratory: Normal respiratory effort, no increased work of breathing. {GI: Abdomen is soft, non tender, non distended, no abdominal masses. Liver and spleen not palpable.  No hernias appreciated.  Stool sample for occult testing is not indicated.} {GU: No CVA tenderness.  No bladder fullness or masses.  Patient with circumcised/uncircumcised phallus. ***Foreskin easily retracted***  Urethral meatus is patent.  No penile discharge. No penile lesions or rashes. Scrotum without lesions, cysts, rashes and/or edema.  Testicles are located scrotally bilaterally. No masses are appreciated in the testicles. Left and right epididymis are normal.} {Rectal: Patient with normal sphincter tone.  Anus and perineum without scarring or rashes.  No rectal masses are appreciated. Prostate is approximately *** grams, *** nodules are appreciated.  Seminal vesicles are normal.} Skin: No rashes, bruises or suspicious lesions. {Lymph: No cervical or inguinal adenopathy.} Neurologic: Grossly intact, no focal deficits, moving all 4 extremities. Psychiatric: Normal mood and  affect.  Laboratory Data: PSA History:   7.3 ng/mL on 00/00/2013   9.4 ng/mL on 08/00/2013   8.3 ng/mL on 02/00/2014  11.3 ng/mL on 09/00/2014- Patient was started on ADT and Xgeva   2.2 ng/mL on 09/13/2013  13.3 ng/mL on 03/18/2014-Patient had discontinued ADT     2.0 ng/mL on 05/00/2016     1.2 ng/ml on 12/30/2014      0.6 ng/mL in 07/2015 - patient discontinued ADT      8.7 ng/mL in 01/2016     13.4 ng/mL in 02/2016     1.10 ng/mL in 05/2016  0.86 in 08/2016 - restarted Lupron  0.45 in 11/2016  0.48 in 02/2017  0.36 in 06/2017  0.5 in 08/2017  0.3 in 11/2017  *** in 03/2018  Lab Results  Component Value Date   WBC 4.5 06/20/2017   HGB 15.1 06/20/2017   HCT 44.8 06/20/2017   MCV 91.5 06/20/2017   PLT 213 06/20/2017  Lab Results  Component Value Date   CREATININE 0.92 06/20/2017    Lab Results  Component Value Date   PSA 1.10 06/06/2016   PSA 1.95 04/11/2016    No results found for: TESTOSTERONE  No results found for: HGBA1C  No results found for: TSH  No results found for: CHOL, HDL, CHOLHDL, VLDL, LDLCALC  Lab Results  Component Value Date   AST 23 06/20/2017   Lab Results  Component Value Date   ALT 16 (L) 06/20/2017   No components found for: ALKALINEPHOPHATASE No components found for: BILIRUBINTOTAL  No results found for: ESTRADIOL  Urinalysis No results found for: COLORURINE, APPEARANCEUR, LABSPEC, PHURINE, GLUCOSEU, HGBUR, BILIRUBINUR, KETONESUR, PROTEINUR, UROBILINOGEN, NITRITE, LEUKOCYTESUR  I have reviewed the labs.  Assessment & Plan:    1. Prostate cancer *** - PSA drawn today  *** - Continue Lupron  *** - Lupron injection given today *** - RTC in 6 months for Lupron, PSA and office visit  *** - RTC in 3 months for PSA only   2. 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 Janese Banks in 06/2017 and he has been released back into our care with recommendations of checking PSA q 3 months/repeat imaging only if  worsening clinical signs or rising PSA  3. Gynecomastia *** - Explain to the patient that is it likely the side effect of the Lupron  *** - He wanted to discontinue the Lupron, but I advised him that the breast tissue will not go away and it would cause his prostate cancer to flair *** - He declined a referral to general surgery at this time or a mammogram   No follow-ups on file.  [name], PA-C  [facility] [address] [place], [place] [address] [phone]  I, Adele Schilder, am acting as a Education administrator for [employer], PA-C.   {Add Scribe Attestation Statement}

## 2018-04-02 LAB — PSA: PROSTATE SPECIFIC AG, SERUM: 0.3 ng/mL (ref 0.0–4.0)

## 2018-04-03 ENCOUNTER — Telehealth: Payer: Self-pay | Admitting: Family Medicine

## 2018-04-03 ENCOUNTER — Ambulatory Visit: Payer: Medicare Other | Admitting: Urology

## 2018-04-03 NOTE — Telephone Encounter (Signed)
Please get approval for Lupron. Thank you

## 2018-04-03 NOTE — Telephone Encounter (Signed)
-----   Message from Nori Riis, PA-C sent at 04/03/2018 10:18 AM EDT ----- I have left a message for [name] to call us back.  He is receiving Lupron injections, so we will need to check with his insurance.  If they approve the injection, he needs to come in for an office visit and an injection.

## 2018-04-09 ENCOUNTER — Telehealth: Payer: Self-pay | Admitting: Urology

## 2018-04-09 NOTE — Telephone Encounter (Signed)
NO PA FOR LUPRON 04-06-18 MICHELLE

## 2018-04-09 NOTE — Telephone Encounter (Signed)
Mr. Elie has had his PA for Lupron and it is approved.  He needs to come in for a 6 months Lupron.

## 2018-04-13 NOTE — Telephone Encounter (Signed)
Left message for pt to cb to schedule a Lupron injection on the nurse schedule   Sharyn Lull

## 2018-04-20 ENCOUNTER — Telehealth: Payer: Self-pay | Admitting: Urology

## 2018-04-20 NOTE — Telephone Encounter (Signed)
I have left several messages for the patient to call back to schedule his Lupron injection    Sharyn Lull

## 2018-06-09 ENCOUNTER — Other Ambulatory Visit: Payer: Self-pay | Admitting: Family Medicine

## 2018-06-09 DIAGNOSIS — C61 Malignant neoplasm of prostate: Secondary | ICD-10-CM

## 2018-06-10 ENCOUNTER — Ambulatory Visit: Payer: PRIVATE HEALTH INSURANCE | Admitting: Urology

## 2018-06-10 ENCOUNTER — Other Ambulatory Visit: Payer: Medicare HMO

## 2018-06-10 ENCOUNTER — Other Ambulatory Visit: Payer: Self-pay

## 2018-06-10 DIAGNOSIS — C61 Malignant neoplasm of prostate: Secondary | ICD-10-CM

## 2018-06-11 LAB — PSA: Prostate Specific Ag, Serum: 0.6 ng/mL (ref 0.0–4.0)

## 2018-06-25 NOTE — Progress Notes (Signed)
06/26/2018 2:30 PM   Dean White 1928-03-03 [id number]  Referring provider: Cletis Athens, MD [place],  [place] [address]  Chief Complaint  Patient presents with   Prostate Cancer    follow up    HPI: Dean White is a 85 y.o. male with metastatic prostate cancer who presents today for his 6 month follow up.    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    Bone scan 08/2016 noted abnormal uptake throughout the sacrum is consistent with Paget's disease as seen on the comparison CT scans. Punctate sclerotic foci in the sacrum on CT scan are highly suspicious for metastatic disease and obscured on this exam.   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  Increased activity in the midthoracic spine correlates with degenerative disease and endplate sclerosis at B8-6. Punctate sclerotic focus in T10 on CT scan worrisome for metastatic disease is not visible on this exam.  Mildly increased uptake in the mid shafts of the femurs bilaterally could be due to metastatic disease.  Contrast CT noted coarsened trabeculation and fairly classic appearance for Paget's disease involving the pelvis, right proximal femur, and T7 vertebral body, essentially stable from 2009. I am skeptical of superimposed metastatic disease in these regions.  Superior endplate sclerosis at T8 appears to be geode degenerative. Small sclerotic lesions in the T10 vertebral body are stable from 2009 an while these could be from old prostate metastatic disease they could also simply be bone islands.  Faint makes density along the inferior tip of the scapula is nonspecific, and likely merit surveillance. However, this area does not appear to have significant asymmetric activity on today' s bone scan.  The  left upper lobe pulmonary nodules of concern have been stable since 2012, and are likely benign. No adenopathy in the chest, abdomen, or pelvis.  Other imaging findings of potential clinical significance: Aortic Atherosclerosis (ICD10-I70.0) and Emphysema (ICD10-J43.9).  Degenerative glenohumeral arthropathy bilaterally. Mildly elevated right hemidiaphragm with chronic adjacent volume loss. Prostate gland measures about 40 cubic cm. Lumbar spondylosis and degenerative disc disease causing lower lumbar impingement.  His main complaint on his 09/04/2017 visit was bilateral breast enlargement.  He stated that he feels like a "sissy."  He was also having night sweats and weight loss.  He had a good appetite.  He was taking a "whole lot of vitamins."  One was called Bone Up, so one hopes it is calcium.  Patient denied any gross hematuria, dysuria or suprapubic/flank pain.  Patient denied any fevers, chills, nausea or vomiting.  Patient received a Lupron injection on that visit.  Today, he is concerned about his bilateral breast enlargement.  He is also having urinary intermittency.  He is still having some hot flashes, but they are not as bothersome.  He has a good appetite.  He states he is still taking his vitamins.  Patient denies any gross hematuria, dysuria or suprapubic/flank pain.  Patient denies any fevers, chills, nausea or vomiting.   PMH: Past Medical History:  Diagnosis Date   Adenocarcinoma of prostate ([place])    BPH with obstruction/lower urinary tract symptoms    HLD (hyperlipidemia)    Paget disease of bone     Surgical History: Past Surgical History:  Procedure Laterality Date   CHOLECYSTECTOMY     PROSTATE BIOPSY     PROSTATE SURGERY      Home Medications:  Allergies as of 06/26/2018   No Known Allergies     Medication List       Accurate as of June 26, 2018  2:30 PM. If you have any questions, ask your nurse or doctor.        aspirin 81 MG tablet Take 81 mg by  mouth daily.   cholecalciferol 1000 units tablet Commonly known as: VITAMIN D Take 1,000 Units by mouth daily.   latanoprost 0.005 % ophthalmic solution Commonly known as: XALATAN place 1 drop into both eyes at bedtime   lovastatin 40 MG tablet Commonly known as: MEVACOR Take 40 mg by mouth at bedtime.   Lupron Depot (67-Month) 45 MG injection Generic drug: Leuprolide Acetate (6 Month) Inject 45 mg into the muscle every 6 (six) months.   multivitamin tablet Take 1 tablet by mouth daily.   PROSTATE HEALTH PO Take by mouth.       Allergies: No Known Allergies  Family History: Family History  Problem Relation Age of Onset   Cancer Son        melanoma   Breast cancer Sister    Cancer Brother    Kidney disease Neg Hx    Prostate cancer Neg Hx     Social History:  reports that he has quit smoking. He has never used smokeless tobacco. He reports that he does not drink alcohol or use drugs.  ROS: UROLOGY Frequent Urination?: No Hard to postpone urination?: No Burning/pain with urination?: No Get up at night to urinate?: No Leakage of urine?: No Urine stream starts and stops?: Yes Trouble starting stream?: No Do you have to strain to urinate?: No Blood in urine?: No Urinary tract infection?: No Sexually transmitted disease?: No Injury to kidneys or bladder?: No Painful intercourse?: No Weak stream?: No Erection problems?: No Penile pain?: No  Gastrointestinal Nausea?: No Vomiting?: No Indigestion/heartburn?: No Diarrhea?: No Constipation?: No  Constitutional Fever: No Night sweats?: No Weight loss?: No Fatigue?: No  Skin Skin rash/lesions?: No Itching?: No  Eyes Blurred vision?: No Double vision?: No  Ears/Nose/Throat Sore throat?: No Sinus problems?: No  Hematologic/Lymphatic Swollen glands?: No Easy bruising?: No  Cardiovascular Leg swelling?: No Chest pain?: No  Respiratory Cough?: No Shortness of breath?:  No  Endocrine Excessive thirst?: No  Musculoskeletal Back pain?: No Joint pain?: No  Neurological Headaches?: No Dizziness?: No  Psychologic Depression?: No Anxiety?: No  Physical Exam: BP 126/74    Pulse 63    Ht 5\' 6"  (1.676 m)    Wt 160 lb (72.6 kg)    BMI 25.82 kg/m   Constitutional:  Well nourished. Alert and oriented, No acute distress. HEENT: [place] AT, moist mucus membranes.  Trachea midline, no masses. Cardiovascular: No clubbing, cyanosis, or edema. Respiratory: Normal respiratory effort, no increased work of breathing. GI: Abdomen is soft, non tender, non distended, no abdominal masses. Liver and spleen not palpable.  No hernias appreciated.  Stool sample for occult testing is not indicated.   GU: No CVA tenderness.  No bladder fullness or masses.   Neurologic: Grossly intact, no focal deficits, moving all 4 extremities. Psychiatric: Normal mood and affect. Breast exam:  Bilateral gynecomastia.  No lumps palpated in the breast tissue or axilla bilaterally.    Laboratory Data: PSA History:   7.3 ng/mL on 00/00/2013   9.4 ng/mL on 08/00/2013  8.3 ng/mL on 02/00/2014  11.3 ng/mL on 09/00/2014- Patient was started on ADT and Xgeva   2.2 ng/mL on 09/13/2013  13.3 ng/mL on 03/18/2014-Patient had discontinued ADT     2.0 ng/mL on 05/00/2016     1.2 ng/ml on 12/30/2014      0.6 ng/mL in 07/2015 - patient discontinued ADT      8.7 ng/mL in 01/2016     13.4 ng/mL in 02/2016     1.10 ng/mL in 05/2016  0.86 in 08/2016 - restarted Lupron  0.45 in 11/2016  0.48 in 02/2017  0.36 in 06/2017  0.5 in 08/2017  0.3 in 11/2017  0.6 in 03/2018  0.6 in 05/2018  Lab Results  Component Value Date   WBC 4.5 06/20/2017   HGB 15.1 06/20/2017   HCT 44.8 06/20/2017   MCV 91.5 06/20/2017   PLT 213 06/20/2017    Lab Results  Component Value Date   CREATININE 0.92 06/20/2017    Lab Results  Component Value Date   PSA 1.10 06/06/2016   PSA 1.95 04/11/2016     No results found for: TESTOSTERONE  No results found for: HGBA1C  No results found for: TSH  No results found for: CHOL, HDL, CHOLHDL, VLDL, LDLCALC  Lab Results  Component Value Date   AST 23 06/20/2017   Lab Results  Component Value Date   ALT 16 (L) 06/20/2017   No components found for: ALKALINEPHOPHATASE No components found for: BILIRUBINTOTAL  No results found for: ESTRADIOL  Urinalysis No results found for: COLORURINE, APPEARANCEUR, LABSPEC, PHURINE, GLUCOSEU, HGBUR, BILIRUBINUR, KETONESUR, PROTEINUR, UROBILINOGEN, NITRITE, LEUKOCYTESUR  I have reviewed the labs.  Assessment & Plan:    1. Prostate cancer Continue Lupron  Lupron injection given today RTC in 6 months for Lupron, PSA and office visit  RTC in 3 months for PSA only   2. 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 Janese Banks in 06/2017 and he has been released back into our care with recommendations of checking PSA q 3 months/repeat imaging only if worsening clinical signs or rising PSA  3. Gynecomastia Explain to the patient that is it likely the side effect of the Lupron but that it would be a good idea to have it evaluated further  Placed orders for mammogram and breast ultrasound, but patient may not follow through   Return in about 6 months (around 12/26/2018) for PSA and Lupron injection .  [name], PA-C  [facility] [address] [place],  [address] [phone]

## 2018-06-26 ENCOUNTER — Other Ambulatory Visit: Payer: Self-pay

## 2018-06-26 ENCOUNTER — Ambulatory Visit (INDEPENDENT_AMBULATORY_CARE_PROVIDER_SITE_OTHER): Payer: Medicare HMO | Admitting: Urology

## 2018-06-26 ENCOUNTER — Encounter: Payer: Self-pay | Admitting: Urology

## 2018-06-26 VITALS — BP 126/74 | HR 63 | Ht 66.0 in | Wt 160.0 lb

## 2018-06-26 DIAGNOSIS — N62 Hypertrophy of breast: Secondary | ICD-10-CM

## 2018-06-26 DIAGNOSIS — C61 Malignant neoplasm of prostate: Secondary | ICD-10-CM | POA: Diagnosis not present

## 2018-06-26 DIAGNOSIS — C7951 Secondary malignant neoplasm of bone: Secondary | ICD-10-CM | POA: Diagnosis not present

## 2018-06-26 MED ORDER — LEUPROLIDE ACETATE (6 MONTH) 45 MG IM KIT
45.0000 mg | PACK | Freq: Once | INTRAMUSCULAR | Status: AC
  Administered 2018-06-26: 45 mg via INTRAMUSCULAR

## 2018-06-26 NOTE — Progress Notes (Signed)
Lupron IM Injection   Due to Prostate Cancer patient is present today for a Lupron Injection.  Medication: Lupron 6  month Dose: 45 mg  Location: left upper outer buttocks Lot: [id number] Exp: 06/16/2020  Patient tolerated well, no complications were noted  Performed by: Verlene Mayer, CMA  Follow up: 6 months

## 2018-06-30 DIAGNOSIS — M76891 Other specified enthesopathies of right lower limb, excluding foot: Secondary | ICD-10-CM | POA: Diagnosis not present

## 2018-06-30 DIAGNOSIS — M5093 Cervical disc disorder, unspecified, cervicothoracic region: Secondary | ICD-10-CM | POA: Diagnosis not present

## 2018-06-30 DIAGNOSIS — E559 Vitamin D deficiency, unspecified: Secondary | ICD-10-CM | POA: Diagnosis not present

## 2018-06-30 DIAGNOSIS — C61 Malignant neoplasm of prostate: Secondary | ICD-10-CM | POA: Diagnosis not present

## 2018-07-19 DIAGNOSIS — M7062 Trochanteric bursitis, left hip: Secondary | ICD-10-CM | POA: Diagnosis not present

## 2018-09-29 DIAGNOSIS — M76891 Other specified enthesopathies of right lower limb, excluding foot: Secondary | ICD-10-CM | POA: Diagnosis not present

## 2018-09-29 DIAGNOSIS — M5093 Cervical disc disorder, unspecified, cervicothoracic region: Secondary | ICD-10-CM | POA: Diagnosis not present

## 2018-09-29 DIAGNOSIS — C61 Malignant neoplasm of prostate: Secondary | ICD-10-CM | POA: Diagnosis not present

## 2018-09-29 DIAGNOSIS — E559 Vitamin D deficiency, unspecified: Secondary | ICD-10-CM | POA: Diagnosis not present

## 2018-12-28 ENCOUNTER — Other Ambulatory Visit: Payer: Self-pay | Admitting: *Deleted

## 2018-12-28 DIAGNOSIS — C61 Malignant neoplasm of prostate: Secondary | ICD-10-CM

## 2018-12-29 ENCOUNTER — Other Ambulatory Visit: Payer: Medicare HMO

## 2018-12-29 DIAGNOSIS — M76891 Other specified enthesopathies of right lower limb, excluding foot: Secondary | ICD-10-CM | POA: Diagnosis not present

## 2018-12-29 DIAGNOSIS — M818 Other osteoporosis without current pathological fracture: Secondary | ICD-10-CM | POA: Diagnosis not present

## 2018-12-29 DIAGNOSIS — M5093 Cervical disc disorder, unspecified, cervicothoracic region: Secondary | ICD-10-CM | POA: Diagnosis not present

## 2018-12-29 DIAGNOSIS — M889 Osteitis deformans of unspecified bone: Secondary | ICD-10-CM | POA: Diagnosis not present

## 2019-01-01 ENCOUNTER — Ambulatory Visit: Payer: Medicare HMO | Admitting: Urology

## 2019-01-05 ENCOUNTER — Other Ambulatory Visit: Payer: Self-pay

## 2019-01-05 ENCOUNTER — Other Ambulatory Visit: Payer: Medicare HMO

## 2019-01-05 DIAGNOSIS — C61 Malignant neoplasm of prostate: Secondary | ICD-10-CM

## 2019-01-06 LAB — PSA: Prostate Specific Ag, Serum: 0.3 ng/mL (ref 0.0–4.0)

## 2019-01-11 NOTE — Progress Notes (Signed)
01/12/2019 1:47 PM   Dean White Jul 20, 1928 [id number]  Referring provider: Cletis Athens, MD [place],  [place] [address]  Chief Complaint  Patient presents with  . Prostate Cancer    HPI: Dean White is a 85 y.o. male with metastatic prostate cancer who presents today for his 6 month follow up.    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    Bone scan 08/2016 noted abnormal uptake throughout the sacrum is consistent with Paget's disease as seen on the comparison CT scans. Punctate sclerotic foci in the sacrum on CT scan are highly suspicious for metastatic disease and obscured on this exam.  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  Increased activity in the midthoracic spine correlates with degenerative disease and endplate sclerosis at [id number]. Punctate sclerotic focus in T10 on CT scan worrisome for metastatic disease is not visible on this exam.  Mildly increased uptake in the mid shafts of the femurs bilaterally could be due to metastatic disease.  Contrast CT noted coarsened trabeculation and fairly classic appearance for Paget's disease involving the pelvis, right proximal femur, and T7 vertebral body, essentially stable from 2009. I am skeptical of superimposed metastatic disease in these regions.  Superior endplate sclerosis at T8 appears to be geode degenerative. Small sclerotic lesions in the T10 vertebral body are stable from 2009 an while these could be from old prostate metastatic disease they could also simply be bone islands.  Faint makes density along the inferior tip of the scapula is nonspecific, and likely merit surveillance. However, this area does not appear to have significant asymmetric activity on today' s bone scan.  The left upper lobe  pulmonary nodules of concern have been stable since 2012, and are likely benign.  No adenopathy in the chest, abdomen, or pelvis.  Other imaging findings of potential clinical significance: Aortic Atherosclerosis (ICD10-I70.0) and Emphysema (ICD10-J43.9).  Degenerative glenohumeral arthropathy bilaterally. Mildly elevated right hemidiaphragm with chronic adjacent volume loss. Prostate gland measures about 40 cubic cm. Lumbar spondylosis and degenerative disc disease causing lower lumbar impingement.  His main complaint on his 09/04/2017 visit was bilateral breast enlargement.  He stated that he feels like a "sissy."  He was also having night sweats and weight loss.  He had a good appetite.  He was taking a "whole lot of vitamins."  One was called Bone Up, so one hopes it is calcium.  Patient denied any gross hematuria, dysuria or suprapubic/flank pain.  Patient denied any fevers, chills, nausea or vomiting.  Patient received a Lupron injection on that visit.  06/2018 :he is concerned about his bilateral breast enlargement.  He is also having urinary intermittency.  He is still having some hot flashes, but they are not as bothersome.  He has a good appetite.  He states he is still taking his vitamins.  Patient denies any gross hematuria, dysuria or suprapubic/flank pain.  Patient denies any fevers, chills, nausea or vomiting.   Today, he is not having any major complaints.  He is still mowing his own yard, picking up sticks and raking leaves.  He is taking his vitamins and has not had any hot flashes.  He still has the bilateral breast tissue, but he is less bothered by  it.  He has a good appetite.  Patient denies any gross hematuria, dysuria or suprapubic/flank pain.  Patient denies any fevers, chills, nausea or vomiting.    PMH: Past Medical History:  Diagnosis Date  . Adenocarcinoma of prostate ([place])   . BPH with obstruction/lower urinary tract symptoms   . HLD (hyperlipidemia)   . Paget disease of  bone     Surgical History: Past Surgical History:  Procedure Laterality Date  . CHOLECYSTECTOMY    . PROSTATE BIOPSY    . PROSTATE SURGERY      Home Medications:  Allergies as of 01/12/2019   No Known Allergies     Medication List       Accurate as of January 12, 2019  1:47 PM. If you have any questions, ask your nurse or doctor.        aspirin 81 MG tablet Take 81 mg by mouth daily.   cholecalciferol 1000 units tablet Commonly known as: VITAMIN D Take 1,000 Units by mouth daily.   latanoprost 0.005 % ophthalmic solution Commonly known as: XALATAN place 1 drop into both eyes at bedtime   lovastatin 40 MG tablet Commonly known as: MEVACOR Take 40 mg by mouth at bedtime.   Lupron Depot (67-Month) 45 MG injection Generic drug: Leuprolide Acetate (6 Month) Inject 45 mg into the muscle every 6 (six) months.   multivitamin tablet Take 1 tablet by mouth daily.   PROSTATE HEALTH PO Take by mouth.       Allergies: No Known Allergies  Family History: Family History  Problem Relation Age of Onset  . Cancer Son        melanoma  . Breast cancer Sister   . Cancer Brother   . Kidney disease Neg Hx   . Prostate cancer Neg Hx     Social History:  reports that he has quit smoking. He has never used smokeless tobacco. He reports that he does not drink alcohol or use drugs.  ROS: UROLOGY Frequent Urination?: No Hard to postpone urination?: No Burning/pain with urination?: No Get up at night to urinate?: No Leakage of urine?: No Urine stream starts and stops?: No Trouble starting stream?: No Do you have to strain to urinate?: No Blood in urine?: No Urinary tract infection?: No Sexually transmitted disease?: No Injury to kidneys or bladder?: No Painful intercourse?: No Weak stream?: No Erection problems?: No Penile pain?: No  Gastrointestinal Nausea?: No Vomiting?: No Indigestion/heartburn?: No Diarrhea?: No Constipation?:  No  Constitutional Fever: No Night sweats?: No Weight loss?: No Fatigue?: No  Skin Skin rash/lesions?: No Itching?: No  Eyes Blurred vision?: No Double vision?: No  Ears/Nose/Throat Sore throat?: No Sinus problems?: No  Hematologic/Lymphatic Swollen glands?: No Easy bruising?: No  Cardiovascular Leg swelling?: No Chest pain?: No  Respiratory Cough?: No Shortness of breath?: No  Endocrine Excessive thirst?: No  Musculoskeletal Back pain?: No Joint pain?: No  Neurological Headaches?: No Dizziness?: No  Psychologic Depression?: No Anxiety?: No  Physical Exam: BP 127/77   Pulse 81   Ht 5\' 6"  (1.676 m)   Wt 159 lb 1.6 oz (72.2 kg)   BMI 25.68 kg/m   Constitutional:  Well nourished. Alert and oriented, No acute distress. HEENT: [place] AT, moist mucus membranes.  Trachea midline, no masses. Cardiovascular: No clubbing, cyanosis, or edema. Respiratory: Normal respiratory effort, no increased work of breathing. GI: Abdomen is soft, non tender, non distended, no abdominal masses. Neurologic: Grossly intact, no focal deficits, moving all 4 extremities. Psychiatric:  Normal mood and affect. Breast exam: bilateral breast enlargement, no lumps palpated in either breast or either axilla.  Laboratory Data: PSA History:   7.3 ng/mL on 00/00/2013   9.4 ng/mL on 08/00/2013   8.3 ng/mL on 02/00/2014  11.3 ng/mL on 09/00/2014- Patient was started on ADT and Xgeva   2.2 ng/mL on 09/13/2013  13.3 ng/mL on 03/18/2014-Patient had discontinued ADT     2.0 ng/mL on 05/00/2016     1.2 ng/ml on 12/30/2014      0.6 ng/mL in 07/2015 - patient discontinued ADT      8.7 ng/mL in 01/2016     13.4 ng/mL in 02/2016     1.10 ng/mL in 05/2016  0.86 in 08/2016 - restarted Lupron  0.45 in 11/2016  0.48 in 02/2017  0.36 in 06/2017  0.5 in 08/2017  0.3 in 11/2017  0.6 in 03/2018  0.6 in 05/2018 Component     Latest Ref Rng & Units 10/06/2014 07/20/2015 01/22/2016 02/26/2016   Prostate Specific Ag, Serum     0.0 - 4.0 ng/mL 1.4 0.6 8.7 (H) 13.4 (H)   Component     Latest Ref Rng & Units 09/04/2017 12/03/2017 04/01/2018 06/10/2018  Prostate Specific Ag, Serum     0.0 - 4.0 ng/mL 0.5 0.3 0.3 0.6   Component     Latest Ref Rng & Units 01/05/2019  Prostate Specific Ag, Serum     0.0 - 4.0 ng/mL 0.3   Lab Results  Component Value Date   WBC 4.5 06/20/2017   HGB 15.1 06/20/2017   HCT 44.8 06/20/2017   MCV 91.5 06/20/2017   PLT 213 06/20/2017    Lab Results  Component Value Date   CREATININE 0.92 06/20/2017    Lab Results  Component Value Date   PSA 1.10 06/06/2016   PSA 1.95 04/11/2016    No results found for: TESTOSTERONE  No results found for: HGBA1C  No results found for: TSH  No results found for: CHOL, HDL, CHOLHDL, VLDL, LDLCALC  Lab Results  Component Value Date   AST 23 06/20/2017   Lab Results  Component Value Date   ALT 16 (L) 06/20/2017   No components found for: ALKALINEPHOPHATASE No components found for: BILIRUBINTOTAL  No results found for: ESTRADIOL  Urinalysis No results found for: COLORURINE, APPEARANCEUR, LABSPEC, PHURINE, GLUCOSEU, HGBUR, BILIRUBINUR, KETONESUR, PROTEINUR, UROBILINOGEN, NITRITE, LEUKOCYTESUR  I have reviewed the labs.  Assessment & Plan:    1. Prostate cancer Continue ADT Eligard injection given today  RTC in 6 months for Lupron, PSA and office visit  RTC in 3 months for PSA only   2. 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 Janese Banks in 06/2017 and he has been released back into our care with recommendations of checking PSA q 3 months/repeat imaging only if worsening clinical signs or rising PSA  3. Gynecomastia Not bothersome to the patient  Does not want to pursue further evaluation or treatment   Return in about 3 months (around 04/12/2019) for PSA only .  [name], PA-C  [facility] [address] [place], [place] [address] [phone]

## 2019-01-12 ENCOUNTER — Other Ambulatory Visit: Payer: Self-pay

## 2019-01-12 ENCOUNTER — Encounter: Payer: Self-pay | Admitting: Urology

## 2019-01-12 ENCOUNTER — Ambulatory Visit (INDEPENDENT_AMBULATORY_CARE_PROVIDER_SITE_OTHER): Payer: Medicare HMO | Admitting: Urology

## 2019-01-12 VITALS — BP 127/77 | HR 81 | Ht 66.0 in | Wt 159.1 lb

## 2019-01-12 DIAGNOSIS — C7951 Secondary malignant neoplasm of bone: Secondary | ICD-10-CM

## 2019-01-12 DIAGNOSIS — N62 Hypertrophy of breast: Secondary | ICD-10-CM | POA: Diagnosis not present

## 2019-01-12 DIAGNOSIS — C61 Malignant neoplasm of prostate: Secondary | ICD-10-CM | POA: Diagnosis not present

## 2019-01-12 MED ORDER — LEUPROLIDE ACETATE (4 MONTH) 30 MG ~~LOC~~ KIT: 30.0000 mg | PACK | Freq: Once | SUBCUTANEOUS | Status: DC

## 2019-01-12 MED ORDER — LEUPROLIDE ACETATE (6 MONTH) 45 MG ~~LOC~~ KIT
45.0000 mg | PACK | Freq: Once | SUBCUTANEOUS | Status: AC
  Administered 2019-01-12: 45 mg via SUBCUTANEOUS

## 2019-01-12 NOTE — Progress Notes (Signed)
Eligard SubQ Injection   Due to Prostate Cancer patient is present today for a Eligard Injection.  Medication: Eligard 6 month Dose: 45 mg  Location: right  Lot: [id number] Exp: 10/03/2020  Patient tolerated well, no complications were noted  Performed by: Elberta Leatherwood, CMA  Per Larene Beach patient is to continue therapy for 6 month. Patient's next follow up was scheduled for 6 months. This appointment was scheduled using wheel and given to patient today along with reminder continue on Vitamin D 800-1000iu and Calium 1000-1200mg  daily while on Androgen Deprivation Therapy.

## 2019-01-14 DIAGNOSIS — H401113 Primary open-angle glaucoma, right eye, severe stage: Secondary | ICD-10-CM | POA: Diagnosis not present

## 2019-04-12 ENCOUNTER — Other Ambulatory Visit: Payer: Self-pay | Admitting: *Deleted

## 2019-04-12 DIAGNOSIS — C61 Malignant neoplasm of prostate: Secondary | ICD-10-CM

## 2019-04-13 ENCOUNTER — Other Ambulatory Visit: Payer: Self-pay

## 2019-04-13 ENCOUNTER — Encounter: Payer: Self-pay | Admitting: Urology

## 2019-04-21 ENCOUNTER — Other Ambulatory Visit: Payer: Self-pay

## 2019-04-21 ENCOUNTER — Other Ambulatory Visit: Payer: Medicare (Managed Care)

## 2019-04-21 DIAGNOSIS — C61 Malignant neoplasm of prostate: Secondary | ICD-10-CM

## 2019-04-22 LAB — PSA: Prostate Specific Ag, Serum: 0.2 ng/mL (ref 0.0–4.0)

## 2019-04-26 ENCOUNTER — Other Ambulatory Visit: Payer: Self-pay | Admitting: Family Medicine

## 2019-04-26 ENCOUNTER — Telehealth: Payer: Self-pay | Admitting: Family Medicine

## 2019-04-26 DIAGNOSIS — C61 Malignant neoplasm of prostate: Secondary | ICD-10-CM

## 2019-04-26 NOTE — Telephone Encounter (Signed)
-----   Message from Nori Riis, PA-C sent at 04/26/2019  8:37 AM EDT ----- Please let Mr. Rieker know that his PSA is stable at 0.2.  We will need to see him in three months for I PSS, SHIM and exam with PSA prior.

## 2019-04-26 NOTE — Telephone Encounter (Signed)
LMOM for patient to return call to schedule appointment in 3 months to see shannon.

## 2019-04-27 NOTE — Telephone Encounter (Signed)
LMOM for patient to return call and schedule appointment. 

## 2019-04-28 NOTE — Telephone Encounter (Signed)
Pt LMOM asking about lab results.

## 2019-04-28 NOTE — Telephone Encounter (Signed)
LMOM for patient to call back for lab results, schedule another appointment.

## 2019-04-28 NOTE — Telephone Encounter (Signed)
Patient notified and appointments have been made.  

## 2019-06-29 ENCOUNTER — Other Ambulatory Visit: Payer: Self-pay

## 2019-06-29 ENCOUNTER — Ambulatory Visit (INDEPENDENT_AMBULATORY_CARE_PROVIDER_SITE_OTHER): Payer: Medicare (Managed Care) | Admitting: Internal Medicine

## 2019-06-29 ENCOUNTER — Encounter: Payer: Self-pay | Admitting: Internal Medicine

## 2019-06-29 VITALS — BP 114/95 | HR 65 | Ht 71.0 in | Wt 151.5 lb

## 2019-06-29 DIAGNOSIS — C7951 Secondary malignant neoplasm of bone: Secondary | ICD-10-CM

## 2019-06-29 DIAGNOSIS — M889 Osteitis deformans of unspecified bone: Secondary | ICD-10-CM | POA: Diagnosis not present

## 2019-06-29 DIAGNOSIS — C61 Malignant neoplasm of prostate: Secondary | ICD-10-CM | POA: Diagnosis not present

## 2019-06-29 NOTE — Assessment & Plan Note (Signed)
Patient disease is stable at the present time

## 2019-06-29 NOTE — Assessment & Plan Note (Signed)
Being followed up by urologist.  Stable at the present time except for the pain in the right hip and left hip.  There is no history of swelling of the leg.

## 2019-06-29 NOTE — Progress Notes (Signed)
Established Patient Office Visit  SUBJECTIVE:  Patient ID: [name], male    DOB: 1928-07-16  Age: 86 y.o. MRN: [id number]  CC:  Chief Complaint  Patient presents with  . Hip Pain    arthritis of right hip     HPI Dean White presents for his three month follow up for his right hip arthritis.   His right hip has been hurting intermittently for the last several years.   He follows up with [facility] this month regarding his Prostate cancer for a PSA check and is next Lupron shot. His last appointment was on 01/12/2019 with [name], PA-C.   He is fully vaccinated against COVID-19.   He notes that he burned his left hand with scalding water. A burn is present without infection.   Past Medical History:  Diagnosis Date  . Adenocarcinoma of prostate ([facility])   . BPH with obstruction/lower urinary tract symptoms   . HLD (hyperlipidemia)   . Paget disease of bone     Past Surgical History:  Procedure Laterality Date  . CHOLECYSTECTOMY    . PROSTATE BIOPSY    . PROSTATE SURGERY      Family History  Problem Relation Age of Onset  . Cancer Son        melanoma  . Breast cancer Sister   . Cancer Brother   . Kidney disease Neg Hx   . Prostate cancer Neg Hx     Social History   Socioeconomic History  . Marital status: Divorced    Spouse name: Not on file  . Number of children: Not on file  . Years of education: Not on file  . Highest education level: Not on file  Occupational History  . Not on file  Tobacco Use  . Smoking status: Former Research scientist (life sciences)  . Smokeless tobacco: Never Used  . Tobacco comment: quit 50 years ago  Substance and Sexual Activity  . Alcohol use: No    Alcohol/week: 0.0 standard drinks  . Drug use: No  . Sexual activity: Not on file  Other Topics Concern  . Not on file  Social History Narrative  . Not on file   Social Determinants of Health   Financial Resource Strain:   . Difficulty of Paying Living  Expenses:   Food Insecurity:   . Worried About Charity fundraiser in the Last Year:   . Arboriculturist in the Last Year:   Transportation Needs:   . Film/video editor (Medical):   Marland Kitchen Lack of Transportation (Non-Medical):   Physical Activity:   . Days of Exercise per Week:   . Minutes of Exercise per Session:   Stress:   . Feeling of Stress :   Social Connections:   . Frequency of Communication with Friends and Family:   . Frequency of Social Gatherings with Friends and Family:   . Attends Religious Services:   . Active Member of Clubs or Organizations:   . Attends Archivist Meetings:   Marland Kitchen Marital Status:   Intimate Partner Violence:   . Fear of Current or Ex-Partner:   . Emotionally Abused:   Marland Kitchen Physically Abused:   . Sexually Abused:      Current Outpatient Medications:  .  aspirin 81 MG tablet, Take 81 mg by mouth daily., Disp: , Rfl:  .  cholecalciferol (VITAMIN D) 1000 UNITS tablet, Take 1,000 Units by mouth daily., Disp: , Rfl:  .  latanoprost (XALATAN) 0.005 %  ophthalmic solution, place 1 drop into both eyes at bedtime, Disp: , Rfl: 0 .  Leuprolide Acetate, 6 Month, (LUPRON DEPOT, 50-MONTH,) 45 MG injection, Inject 45 mg into the muscle every 6 (six) months., Disp: , Rfl:  .  Misc Natural Products (PROSTATE HEALTH PO), Take by mouth., Disp: , Rfl:  .  Multiple Vitamin (MULTIVITAMIN) tablet, Take 1 tablet by mouth daily., Disp: , Rfl:  .  lovastatin (MEVACOR) 40 MG tablet, Take 40 mg by mouth at bedtime. (Patient not taking: Reported on 06/29/2019), Disp: , Rfl:    No Known Allergies  ROS Review of Systems  Constitutional: Negative.        Ambulating with a cane  HENT: Negative.   Eyes: Negative.   Respiratory: Negative.   Cardiovascular: Negative.   Gastrointestinal: Negative.   Endocrine: Negative.   Genitourinary: Negative.   Musculoskeletal: Positive for arthralgias (f). Negative for gait problem.  Skin: Positive for wound (burn, left hand).    Allergic/Immunologic: Negative.   Neurological: Negative.   Hematological: Negative.   Psychiatric/Behavioral: Negative.   All other systems reviewed and are negative.     OBJECTIVE:    Physical Exam Vitals reviewed.  Constitutional:      Appearance: Normal appearance.  Neck:     Thyroid: No thyroid mass.  Cardiovascular:     Rate and Rhythm: Normal rate and regular rhythm.     Pulses: Normal pulses.     Heart sounds: Normal heart sounds.  Pulmonary:     Effort: Pulmonary effort is normal.     Breath sounds: Normal breath sounds.  Abdominal:     Tenderness: There is no abdominal tenderness.  Skin:    Findings: Burn (Left hand, from scalding water, 2nd 3rd 4th digits - dorsal aspect without any infection) present.  Neurological:     Mental Status: He is alert and oriented to person, place, and time.  Psychiatric:        Mood and Affect: Mood normal.        Behavior: Behavior normal.     BP (!) 114/95   Pulse 65   Ht 5\' 11"  (1.803 m)   Wt 151 lb 8 oz (68.7 kg)   BMI 21.13 kg/m  Wt Readings from Last 3 Encounters:  06/29/19 151 lb 8 oz (68.7 kg)  01/12/19 159 lb 1.6 oz (72.2 kg)  06/26/18 160 lb (72.6 kg)    Health Maintenance Due  Topic Date Due  . COVID-19 Vaccine (1) Never done  . TETANUS/TDAP  Never done  . PNA vac Low Risk Adult (1 of 2 - PCV13) Never done    There are no preventive care reminders to display for this patient.  CBC Latest Ref Rng & Units 06/20/2017 03/11/2017 12/10/2016  WBC 3.8 - 10.6 K/uL 4.5 4.8 4.9  Hemoglobin 13.0 - 18.0 g/dL 15.1 14.9 15.1  Hematocrit 40 - 52 % 44.8 45.0 46.1  Platelets 150 - 440 K/uL 213 212 203   CMP Latest Ref Rng & Units 06/20/2017 03/11/2017 12/10/2016  Glucose 65 - 99 mg/dL 134(H) 135(H) 120(H)  BUN 6 - 20 mg/dL 14 13 12   Creatinine 0.61 - 1.24 mg/dL 0.92 0.93 0.95  Sodium 135 - 145 mmol/L 138 141 139  Potassium 3.5 - 5.1 mmol/L 3.9 3.4(L) 4.1  Chloride 101 - 111 mmol/L 105 106 104  CO2 22 - 32 mmol/L 25  28 26   Calcium 8.9 - 10.3 mg/dL 9.4 9.3 9.6  Total Protein 6.5 - 8.1 g/dL 6.9  6.9 7.1  Total Bilirubin 0.3 - 1.2 mg/dL 0.7 0.5 0.7  Alkaline Phos 38 - 126 U/L 73 107 127(H)  AST 15 - 41 U/L 23 40 100(H)  ALT 17 - 63 U/L 16(L) 60 164(H)    No results found for: TSH Lab Results  Component Value Date   ALBUMIN 4.1 06/20/2017   ANIONGAP 8 06/20/2017   No results found for: CHOL, HDL, LDLCALC, CHOLHDL No results found for: TRIG No results found for: HGBA1C   Lab Results  Component Value Date   PSA 1.10 06/06/2016   PSA 1.95 04/11/2016       ASSESSMENT & PLAN:   Problem List Items Addressed This Visit      Musculoskeletal and Integument   Secondary malignant neoplasm of bone ([place]) - Primary    Being followed up by urologist.  Stable at the present time except for the pain in the right hip and left hip.  There is no history of swelling of the leg.      Paget's bone disease    Patient disease is stable at the present time        Genitourinary   Malignant neoplasm of prostate ([place])      No orders of the defined types were placed in this encounter.  1. Secondary malignant neoplasm of bone [facility]) Patient is being followed up by urologist however.  2. Malignant neoplasm of prostate [facility]) He gets hormonal chemotherapy from the urologist.  3. Paget's bone disease Patient's disease is stable Follow-up: Return in about 3 months (around 09/29/2019).    Dr. Jane Canary [facility] [address] [place], [place] [address]   By signing my name below, I, [employer], attest that this documentation has been prepared under the direction and in the presence of Cletis Athens, MD. Electronically Signed: Cletis Athens, MD 06/29/19, 10:17 AM    I personally performed the services described in this documentation, which was SCRIBED in my presence. The recorded information has been reviewed and considered accurate. It has been edited as necessary during review. Cletis Athens, MD

## 2019-07-30 ENCOUNTER — Encounter: Payer: Self-pay | Admitting: Urology

## 2019-07-30 ENCOUNTER — Other Ambulatory Visit: Payer: Self-pay

## 2019-08-01 ENCOUNTER — Other Ambulatory Visit: Payer: Self-pay | Admitting: Internal Medicine

## 2019-08-03 ENCOUNTER — Ambulatory Visit (INDEPENDENT_AMBULATORY_CARE_PROVIDER_SITE_OTHER): Payer: Medicare Other | Admitting: Urology

## 2019-08-03 ENCOUNTER — Telehealth: Payer: Self-pay

## 2019-08-03 ENCOUNTER — Encounter: Payer: Self-pay | Admitting: Urology

## 2019-08-03 ENCOUNTER — Other Ambulatory Visit: Payer: Self-pay

## 2019-08-03 VITALS — BP 128/76 | HR 60 | Ht 71.0 in | Wt 155.4 lb

## 2019-08-03 DIAGNOSIS — C61 Malignant neoplasm of prostate: Secondary | ICD-10-CM | POA: Diagnosis not present

## 2019-08-03 DIAGNOSIS — C7951 Secondary malignant neoplasm of bone: Secondary | ICD-10-CM

## 2019-08-03 NOTE — Progress Notes (Signed)
08/03/2019 10:20 AM   [name] Mar 01, 1928 [id number]  Referring provider: Cletis Athens, MD [place],  [place] [address]  Chief Complaint  Patient presents with  . Prostate Cancer    HPI: MICHELL White is a 86 y.o. male with metastatic prostate cancer who presents today for his 6 month follow up.    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    Bone scan 08/2016 noted abnormal uptake throughout the sacrum is consistent with Paget's disease as seen on the comparison CT scans. Punctate sclerotic foci in the sacrum on CT scan are highly suspicious for metastatic disease and obscured on this exam.  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  Increased activity in the midthoracic spine correlates with degenerative disease and endplate sclerosis at L8-9. Punctate sclerotic focus in T10 on CT scan worrisome for metastatic disease is not visible on this exam.  Mildly increased uptake in the mid shafts of the femurs bilaterally could be due to metastatic disease.  Contrast CT noted coarsened trabeculation and fairly classic appearance for Paget's disease involving the pelvis, right proximal femur, and T7 vertebral body, essentially stable from 2009. I am skeptical of superimposed metastatic disease in these regions.  Superior endplate sclerosis at T8 appears to be geode degenerative. Small sclerotic lesions in the T10 vertebral body are stable from 2009 an while these could be from old prostate metastatic disease they could also simply be bone islands.  Faint makes density along the inferior tip of the scapula is nonspecific, and likely merit surveillance. However, this area does not appear to have significant asymmetric activity on today' s bone scan.  The left upper lobe  pulmonary nodules of concern have been stable since 2012, and are likely benign.  No adenopathy in the chest, abdomen, or pelvis.  Other imaging findings of potential clinical significance: Aortic Atherosclerosis (ICD10-I70.0) and Emphysema (ICD10-J43.9).  Degenerative glenohumeral arthropathy bilaterally. Mildly elevated right hemidiaphragm with chronic adjacent volume loss. Prostate gland measures about 40 cubic cm. Lumbar spondylosis and degenerative disc disease causing lower lumbar impingement.  His main complaint on his 09/04/2017 visit was bilateral breast enlargement.  He stated that he feels like a "sissy."  He was also having night sweats and weight loss.  He had a good appetite.  He was taking a "whole lot of vitamins."  One was called Bone Up, so one hopes it is calcium.  Patient denied any gross hematuria, dysuria or suprapubic/flank pain.  Patient denied any fevers, chills, nausea or vomiting.  Patient received a Lupron injection on that visit.  06/2018 :he is concerned about his bilateral breast enlargement.  He is also having urinary intermittency.  He is still having some hot flashes, but they are not as bothersome.  He has a good appetite.  He states he is still taking his vitamins.  Patient denies any gross hematuria, dysuria or suprapubic/flank pain.  Patient denies any fevers, chills, nausea or vomiting.   Today, he is not having any complaints.  He remains active, has a good appetite and is taking his vitamins.  Patient denies any modifying or aggravating factors.  Patient denies any gross hematuria, dysuria or suprapubic/flank pain.  Patient denies any fevers, chills, nausea or vomiting.  PMH: Past Medical History:  Diagnosis Date  . Adenocarcinoma of prostate ([place])   . BPH with obstruction/lower urinary tract symptoms   . HLD (hyperlipidemia)   . Paget disease of bone     Surgical History: Past Surgical History:  Procedure Laterality Date  . CHOLECYSTECTOMY    .  PROSTATE BIOPSY    . PROSTATE SURGERY      Home Medications:  Allergies as of 08/03/2019   No Known Allergies     Medication List       Accurate as of August 03, 2019 10:20 AM. If you have any questions, ask your nurse or doctor.        aspirin 81 MG tablet Take 81 mg by mouth daily.   cholecalciferol 1000 units tablet Commonly known as: VITAMIN D Take 1,000 Units by mouth daily.   latanoprost 0.005 % ophthalmic solution Commonly known as: XALATAN place 1 drop into both eyes at bedtime   lovastatin 40 MG tablet Commonly known as: MEVACOR TAKE 1 TABLET BY MOUTH DAILY   Lupron Depot (31-Month) 45 MG injection Generic drug: Leuprolide Acetate (6 Month) Inject 45 mg into the muscle every 6 (six) months.   multivitamin tablet Take 1 tablet by mouth daily.   PROSTATE HEALTH PO Take by mouth.       Allergies: No Known Allergies  Family History: Family History  Problem Relation Age of Onset  . Cancer Son        melanoma  . Breast cancer Sister   . Cancer Brother   . Kidney disease Neg Hx   . Prostate cancer Neg Hx     Social History:  reports that he has quit smoking. He has never used smokeless tobacco. He reports that he does not drink alcohol and does not use drugs.  ROS: For pertinent review of systems please refer to history of present illness  Physical Exam: BP 128/76   Pulse 60   Ht 5\' 11"  (1.803 m)   Wt 155 lb 6.4 oz (70.5 kg)   BMI 21.67 kg/m   Constitutional:  Well nourished. Alert and oriented, No acute distress. HEENT: [place] AT, mask in place.  Trachea midline Cardiovascular: No clubbing, cyanosis, or edema. Respiratory: Normal respiratory effort, no increased work of breathing. Neurologic: Grossly intact, no focal deficits, moving all 4 extremities. Psychiatric: Normal mood and affect.  Laboratory Data: PSA History:   7.3 ng/mL on 00/00/2013   9.4 ng/mL on 08/00/2013   8.3 ng/mL on 02/00/2014  11.3 ng/mL on 09/00/2014- Patient was  started on ADT and Xgeva   2.2 ng/mL on 09/13/2013  13.3 ng/mL on 03/18/2014-Patient had discontinued ADT     2.0 ng/mL on 05/00/2016     1.2 ng/ml on 12/30/2014      0.6 ng/mL in 07/2015 - patient discontinued ADT      8.7 ng/mL in 01/2016     13.4 ng/mL in 02/2016     1.10 ng/mL in 05/2016  0.86 in 08/2016 - restarted Lupron  0.45 in 11/2016  0.48 in 02/2017  0.36 in 06/2017  0.5 in 08/2017  0.3 in 11/2017  0.6 in 03/2018  0.6 in 05/2018    Component     Latest Ref Rng & Units 10/06/2014 07/20/2015  Prostate Specific Ag, Serum     0.0 - 4.0 ng/mL 1.4 0.6    Component     Latest Ref Rng & Units 01/22/2016 02/26/2016 09/04/2017 12/03/2017  Prostate Specific Ag, Serum  0.0 - 4.0 ng/mL 8.7 (H) 13.4 (H) 0.5 0.3   Component     Latest Ref Rng & Units 04/01/2018 06/10/2018 01/05/2019 04/21/2019  Prostate Specific Ag, Serum     0.0 - 4.0 ng/mL 0.3 0.6 0.3 0.2   Lab Results  Component Value Date   WBC 4.5 06/20/2017   HGB 15.1 06/20/2017   HCT 44.8 06/20/2017   MCV 91.5 06/20/2017   PLT 213 06/20/2017    Lab Results  Component Value Date   CREATININE 0.92 06/20/2017    Lab Results  Component Value Date   PSA 1.10 06/06/2016   PSA 1.95 04/11/2016    Lab Results  Component Value Date   AST 23 06/20/2017   Lab Results  Component Value Date   ALT 16 (L) 06/20/2017   I have reviewed the labs.  Assessment & Plan:    1. Prostate cancer Continue ADT  continue ADT,  but he will need insurance to approve prior to it.   2. 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 Janese Banks in 06/2017 and he has been released back into our care with recommendations of checking PSA q 3 months/repeat imaging only if worsening clinical signs or rising PSA  3. Gynecomastia Not bothersome to the patient  Does not want to pursue further evaluation or treatment   No follow-ups on file.  [name], PA-C  [facility] [address] [place], [place] [address] [phone]  I, Joneen Boers Peace, am acting as a Education administrator for [employer], [employer].  I have reviewed the above documentation for accuracy and completeness, and I agree with the above.    [name], PA-C  I spent 20 minutes on the day of the encounter to include pre-visit record review, face-to-face time with the patient, and post-visit ordering of tests.

## 2019-08-03 NOTE — Telephone Encounter (Signed)
Benefit verification form faxed in for Eligard. Awaiting response.

## 2019-08-04 ENCOUNTER — Telehealth: Payer: Self-pay | Admitting: Family Medicine

## 2019-08-04 LAB — PSA: Prostate Specific Ag, Serum: 0.3 ng/mL (ref 0.0–4.0)

## 2019-08-04 NOTE — Telephone Encounter (Signed)
Patient notified and appointment scheduled. 

## 2019-08-04 NOTE — Telephone Encounter (Signed)
-----   Message from Nori Riis, PA-C sent at 08/04/2019  7:38 AM EDT ----- Please let Mr. Shatto that his PSA remains stable at 0.3.  We need to repeat it in three months.

## 2019-08-20 ENCOUNTER — Telehealth: Payer: Self-pay

## 2019-08-20 NOTE — Telephone Encounter (Signed)
Called and spoke with Benefits at [facility] [phone] spoke with Vaughan Basta and no PA is required [phone]  Called to notify patient and schedule injection, left message to return call. Injection apt made for 89/21

## 2019-08-23 ENCOUNTER — Ambulatory Visit: Payer: Self-pay

## 2019-08-23 NOTE — Telephone Encounter (Signed)
Patient did not show to injection apt, left a message for patient to return call and schedule

## 2019-08-24 ENCOUNTER — Encounter: Payer: Self-pay | Admitting: Urology

## 2019-08-24 NOTE — Telephone Encounter (Signed)
Left pt mess to call 

## 2019-09-01 NOTE — Telephone Encounter (Signed)
Left patient mess to call 

## 2019-09-24 NOTE — Telephone Encounter (Signed)
Unable to reach patient certified letter sent

## 2019-09-29 ENCOUNTER — Ambulatory Visit: Payer: Medicare (Managed Care) | Admitting: Internal Medicine

## 2019-10-26 ENCOUNTER — Other Ambulatory Visit: Payer: Self-pay

## 2019-10-26 ENCOUNTER — Ambulatory Visit (INDEPENDENT_AMBULATORY_CARE_PROVIDER_SITE_OTHER): Payer: Medicare (Managed Care) | Admitting: Internal Medicine

## 2019-10-26 ENCOUNTER — Encounter: Payer: Self-pay | Admitting: Internal Medicine

## 2019-10-26 VITALS — BP 131/65 | HR 87 | Ht 67.0 in | Wt 152.0 lb

## 2019-10-26 DIAGNOSIS — Z23 Encounter for immunization: Secondary | ICD-10-CM | POA: Diagnosis not present

## 2019-10-26 DIAGNOSIS — C61 Malignant neoplasm of prostate: Secondary | ICD-10-CM | POA: Diagnosis not present

## 2019-10-26 DIAGNOSIS — M889 Osteitis deformans of unspecified bone: Secondary | ICD-10-CM | POA: Diagnosis not present

## 2019-10-26 DIAGNOSIS — I10 Essential (primary) hypertension: Secondary | ICD-10-CM

## 2019-10-26 DIAGNOSIS — M25551 Pain in right hip: Secondary | ICD-10-CM | POA: Insufficient documentation

## 2019-10-26 DIAGNOSIS — M25559 Pain in unspecified hip: Secondary | ICD-10-CM

## 2019-10-26 NOTE — Assessment & Plan Note (Signed)
Right hip pain is better patient has not seen the endocrinologist.

## 2019-10-26 NOTE — Assessment & Plan Note (Signed)
Patient seen by neurologist on a regular basis

## 2019-10-26 NOTE — Assessment & Plan Note (Signed)
-   Today, the patient's blood pressure is well managed on diet. - The patient will continue the current treatment regimen.  - I encouraged the patient to eat a low-sodium diet to help control blood pressure. - I encouraged the patient to live an active lifestyle and complete activities that increases heart rate to 85% target heart rate at least 5 times per week for one hour.

## 2019-10-26 NOTE — Progress Notes (Signed)
Established Patient Office Visit  SUBJECTIVE:  Subjective  Patient ID: Dean White, male    DOB: 1928/09/30  Age: 86 y.o. MRN: [id number]  CC:  Chief Complaint  Patient presents with  . Follow-up    paient has no complaints today     HPI Dean White is a 86 y.o. male presenting today for a general follow up.  He has been doing well overall. He notes that his his pain has been manageable; generally he only has hip pain if he sits down for too long.   He is taking his medications as directed and without any complication. He denies any missed doses. His blood pressure is 131/65 today. He continues to see [name] PA-C for his urological issues and metastatic prostate cancer.   He will get his influenza vaccine today. He got his COVID19 booster vaccine.   Past Medical History:  Diagnosis Date  . Adenocarcinoma of prostate ([place])   . BPH with obstruction/lower urinary tract symptoms   . HLD (hyperlipidemia)   . Paget disease of bone     Past Surgical History:  Procedure Laterality Date  . CHOLECYSTECTOMY    . PROSTATE BIOPSY    . PROSTATE SURGERY      Family History  Problem Relation Age of Onset  . Cancer Son        melanoma  . Breast cancer Sister   . Cancer Brother   . Kidney disease Neg Hx   . Prostate cancer Neg Hx     Social History   Socioeconomic History  . Marital status: Divorced    Spouse name: Not on file  . Number of children: Not on file  . Years of education: Not on file  . Highest education level: Not on file  Occupational History  . Not on file  Tobacco Use  . Smoking status: Former Research scientist (life sciences)  . Smokeless tobacco: Never Used  . Tobacco comment: quit 50 years ago  Substance and Sexual Activity  . Alcohol use: No    Alcohol/week: 0.0 standard drinks  . Drug use: No  . Sexual activity: Not on file  Other Topics Concern  . Not on file  Social History Narrative  . Not on file   Social Determinants of Health   Financial  Resource Strain:   . Difficulty of Paying Living Expenses: Not on file  Food Insecurity:   . Worried About Charity fundraiser in the Last Year: Not on file  . Ran Out of Food in the Last Year: Not on file  Transportation Needs:   . Lack of Transportation (Medical): Not on file  . Lack of Transportation (Non-Medical): Not on file  Physical Activity:   . Days of Exercise per Week: Not on file  . Minutes of Exercise per Session: Not on file  Stress:   . Feeling of Stress : Not on file  Social Connections:   . Frequency of Communication with Friends and Family: Not on file  . Frequency of Social Gatherings with Friends and Family: Not on file  . Attends Religious Services: Not on file  . Active Member of Clubs or Organizations: Not on file  . Attends Archivist Meetings: Not on file  . Marital Status: Not on file  Intimate Partner Violence:   . Fear of Current or Ex-Partner: Not on file  . Emotionally Abused: Not on file  . Physically Abused: Not on file  . Sexually Abused: Not on file  Current Outpatient Medications:  .  aspirin 81 MG tablet, Take 81 mg by mouth daily., Disp: , Rfl:  .  cholecalciferol (VITAMIN D) 1000 UNITS tablet, Take 1,000 Units by mouth daily., Disp: , Rfl:  .  latanoprost (XALATAN) 0.005 % ophthalmic solution, place 1 drop into both eyes at bedtime, Disp: , Rfl: 0 .  Leuprolide Acetate, 6 Month, (LUPRON DEPOT, 49-MONTH,) 45 MG injection, Inject 45 mg into the muscle every 6 (six) months., Disp: , Rfl:  .  lovastatin (MEVACOR) 40 MG tablet, TAKE 1 TABLET BY MOUTH DAILY, Disp: 90 tablet, Rfl: 3 .  Misc Natural Products (PROSTATE HEALTH PO), Take by mouth., Disp: , Rfl:  .  Multiple Vitamin (MULTIVITAMIN) tablet, Take 1 tablet by mouth daily., Disp: , Rfl:    No Known Allergies  ROS Review of Systems  Constitutional: Negative.   HENT: Negative.   Eyes: Negative.   Respiratory: Negative.   Cardiovascular: Negative.   Gastrointestinal:  Negative.   Endocrine: Negative.   Genitourinary: Negative.   Musculoskeletal: Negative.   Skin: Negative.   Allergic/Immunologic: Negative.   Neurological: Negative.   Hematological: Negative.   Psychiatric/Behavioral: Negative.   All other systems reviewed and are negative.    OBJECTIVE:    Physical Exam Vitals reviewed.  Constitutional:      Appearance: Normal appearance.  HENT:     Mouth/Throat:     Mouth: Mucous membranes are moist.  Eyes:     Pupils: Pupils are equal, round, and reactive to light.  Neck:     Vascular: No carotid bruit.  Cardiovascular:     Rate and Rhythm: Normal rate and regular rhythm.     Pulses: Normal pulses.     Heart sounds: Normal heart sounds.  Pulmonary:     Effort: Pulmonary effort is normal.     Breath sounds: Normal breath sounds.  Abdominal:     General: Bowel sounds are normal.     Palpations: Abdomen is soft. There is no hepatomegaly, splenomegaly or mass.     Tenderness: There is no abdominal tenderness.     Hernia: No hernia is present.  Musculoskeletal:     Cervical back: Neck supple.     Right lower leg: No edema.     Left lower leg: No edema.  Skin:    Findings: No rash.  Neurological:     Mental Status: He is alert and oriented to person, place, and time.     Motor: No weakness.  Psychiatric:        Mood and Affect: Mood normal.        Behavior: Behavior normal.     BP 131/65   Pulse 87   Ht 5\' 7"  (1.702 m)   Wt 152 lb (68.9 kg)   BMI 23.81 kg/m  Wt Readings from Last 3 Encounters:  10/26/19 152 lb (68.9 kg)  08/03/19 155 lb 6.4 oz (70.5 kg)  06/29/19 151 lb 8 oz (68.7 kg)    Health Maintenance Due  Topic Date Due  . TETANUS/TDAP  Never done  . PNA vac Low Risk Adult (1 of 2 - PCV13) Never done    There are no preventive care reminders to display for this patient.  CBC Latest Ref Rng & Units 06/20/2017 03/11/2017 12/10/2016  WBC 3.8 - 10.6 K/uL 4.5 4.8 4.9  Hemoglobin 13.0 - 18.0 g/dL 15.1 14.9 15.1    Hematocrit 40 - 52 % 44.8 45.0 46.1  Platelets 150 - 440 K/uL 213 212 203  CMP Latest Ref Rng & Units 06/20/2017 03/11/2017 12/10/2016  Glucose 65 - 99 mg/dL 134(H) 135(H) 120(H)  BUN 6 - 20 mg/dL 14 13 12   Creatinine 0.61 - 1.24 mg/dL 0.92 0.93 0.95  Sodium 135 - 145 mmol/L 138 141 139  Potassium 3.5 - 5.1 mmol/L 3.9 3.4(L) 4.1  Chloride 101 - 111 mmol/L 105 106 104  CO2 22 - 32 mmol/L 25 28 26   Calcium 8.9 - 10.3 mg/dL 9.4 9.3 9.6  Total Protein 6.5 - 8.1 g/dL 6.9 6.9 7.1  Total Bilirubin 0.3 - 1.2 mg/dL 0.7 0.5 0.7  Alkaline Phos 38 - 126 U/L 73 107 127(H)  AST 15 - 41 U/L 23 40 100(H)  ALT 17 - 63 U/L 16(L) 60 164(H)    No results found for: TSH Lab Results  Component Value Date   ALBUMIN 4.1 06/20/2017   ANIONGAP 8 06/20/2017   No results found for: CHOL, HDL, LDLCALC, CHOLHDL No results found for: TRIG No results found for: HGBA1C    ASSESSMENT & PLAN:   Problem List Items Addressed This Visit      Cardiovascular and Mediastinum   Essential hypertension    - Today, the patient's blood pressure is well managed on diet. - The patient will continue the current treatment regimen.  - I encouraged the patient to eat a low-sodium diet to help control blood pressure. - I encouraged the patient to live an active lifestyle and complete activities that increases heart rate to 85% target heart rate at least 5 times per week for one hour.            Musculoskeletal and Integument   Paget's bone disease    Right hip pain is better patient has not seen the endocrinologist.        Genitourinary   Malignant neoplasm of prostate [facility])    Patient seen by neurologist on a regular basis        Other   Need for influenza vaccination - Primary    Patient has been vaccinated for influenza.      Relevant Orders   Flu Vaccine QUAD High Dose(Fluad) (Completed)   Hip pain    Hip pain is much better.  He uses a stick to walk occasionally.         No orders of the  defined types were placed in this encounter.   Follow-up: Return in about 2 months (around 12/26/2019) for F/U:.    Cletis Athens, MD [facility] [address] [place],  [address]   By signing my name below, I, [employer], attest that this documentation has been prepared under the direction and in the presence of Dr. Cletis Athens Electronically Signed: Cletis Athens, MD 10/26/19, 11:41 AM  I personally performed the services described in this documentation, which was SCRIBED in my presence. The recorded information has been reviewed and considered accurate. It has been edited as necessary during review. Cletis Athens, MD

## 2019-10-26 NOTE — Assessment & Plan Note (Signed)
Hip pain is much better.  He uses a stick to walk occasionally.

## 2019-10-26 NOTE — Assessment & Plan Note (Signed)
Patient has been vaccinated for influenza.

## 2019-11-02 ENCOUNTER — Other Ambulatory Visit: Payer: Self-pay | Admitting: Family Medicine

## 2019-11-02 DIAGNOSIS — C61 Malignant neoplasm of prostate: Secondary | ICD-10-CM

## 2019-11-04 ENCOUNTER — Ambulatory Visit (INDEPENDENT_AMBULATORY_CARE_PROVIDER_SITE_OTHER): Payer: Medicare Other | Admitting: Physician Assistant

## 2019-11-04 ENCOUNTER — Other Ambulatory Visit: Payer: Medicare Other

## 2019-11-04 ENCOUNTER — Other Ambulatory Visit: Payer: Self-pay

## 2019-11-04 VITALS — BP 117/69 | HR 51 | Ht 66.0 in | Wt 150.0 lb

## 2019-11-04 DIAGNOSIS — C61 Malignant neoplasm of prostate: Secondary | ICD-10-CM

## 2019-11-04 MED ORDER — LEUPROLIDE ACETATE (6 MONTH) 45 MG ~~LOC~~ KIT
45.0000 mg | PACK | Freq: Once | SUBCUTANEOUS | Status: AC
  Administered 2019-11-04: 45 mg via SUBCUTANEOUS

## 2019-11-04 NOTE — Progress Notes (Signed)
Eligard SubQ Injection   Due to Prostate Cancer patient is present today for a Eligard Injection.  Medication: Eligard 6 month Dose: 45 mg  Location: left upper outer buttocks Lot: [id number] Exp: 02/2021  Patient tolerated well, no complications were noted  Performed by: Bradly Bienenstock, CMA  Patient's next follow up was scheduled for 05/09/2020. This appointment was scheduled using wheel and given to patient today along with reminder continue on Vitamin D 800-1000iu and Calcium 1000-1200mg  daily while on Androgen Deprivation Therapy.

## 2019-11-04 NOTE — Progress Notes (Signed)
11/04/2019 4:10 PM   Dean White 1928/05/31 [id number]  CC: Chief Complaint  Patient presents with  . Prostate Cancer    HPI: Dean White is a 86 y.o. male with metastatic prostate cancer and Paget's disease in the right hip who presents today for routine follow-up and Eligard.  He was seen in clinic most recently by Dean White on 08/03/2019.  He was due for Eligard at that time, however prior authorization had not been completed.  He was temporarily lost to follow-up and presents today for ADT.  Last injection 01/12/2019.  Today he reports some increased right hip pain over the past several weeks.  He otherwise feels well.  He is continue to take vitamin D and calcium as instructed.  No acute concerns today.  PMH: Past Medical History:  Diagnosis Date  . Adenocarcinoma of prostate ([place])   . BPH with obstruction/lower urinary tract symptoms   . HLD (hyperlipidemia)   . Paget disease of bone     Surgical History: Past Surgical History:  Procedure Laterality Date  . CHOLECYSTECTOMY    . PROSTATE BIOPSY    . PROSTATE SURGERY      Home Medications:  Allergies as of 11/04/2019   No Known Allergies     Medication List       Accurate as of November 04, 2019  4:10 PM. If you have any questions, ask your nurse or doctor.        aspirin 81 MG tablet Take 81 mg by mouth daily.   cholecalciferol 1000 units tablet Commonly known as: VITAMIN D Take 1,000 Units by mouth daily.   latanoprost 0.005 % ophthalmic solution Commonly known as: XALATAN place 1 drop into both eyes at bedtime   lovastatin 40 MG tablet Commonly known as: MEVACOR TAKE 1 TABLET BY MOUTH DAILY   Lupron Depot (65-Month) 45 MG injection Generic drug: Leuprolide Acetate (6 Month) Inject 45 mg into the muscle every 6 (six) months.   multivitamin tablet Take 1 tablet by mouth daily.   PROSTATE HEALTH PO Take by mouth.       Allergies:  No Known Allergies  Family  History: Family History  Problem Relation Age of Onset  . Cancer Son        melanoma  . Breast cancer Sister   . Cancer Brother   . Kidney disease Neg Hx   . Prostate cancer Neg Hx     Social History:   reports that he has quit smoking. He has never used smokeless tobacco. He reports that he does not drink alcohol and does not use drugs.  Physical Exam: BP 117/69 (BP Location: Left Arm, Patient Position: Sitting, Cuff Size: Normal)   Pulse (!) 51   Ht 5\' 6"  (1.676 m)   Wt 150 lb (68 kg)   BMI 24.21 kg/m   Constitutional:  Alert and oriented, no acute distress, nontoxic appearing HEENT: [place], AT Cardiovascular: No clubbing, cyanosis, or edema Respiratory: Normal respiratory effort, no increased work of breathing Skin: No rashes, bruises or suspicious lesions Neurologic: Grossly intact, no focal deficits, moving all 4 extremities Psychiatric: Normal mood and affect  Assessment & Plan:   1. Prostate cancer [facility]) Patient is overdue for 19-month ADT.  Eligard administered today.  PSA drawn today; if rising, recommend re-imaging given reports of worse hip pain.  Otherwise will plan to continue every 3 month PSA draws. - PSA; Future - leuprolide (6 Month) (ELIGARD) injection 45 mg  Return in about  3 months (around 02/04/2020) for PSA + 6 mos PSA/Eligard/clinic visit.  Debroah Loop, PA-C  [facility] [address] [place], [place] [address] [phone]

## 2019-11-05 LAB — PSA: Prostate Specific Ag, Serum: 0.2 ng/mL (ref 0.0–4.0)

## 2019-12-28 ENCOUNTER — Ambulatory Visit (INDEPENDENT_AMBULATORY_CARE_PROVIDER_SITE_OTHER): Payer: Medicare (Managed Care) | Admitting: Internal Medicine

## 2019-12-28 ENCOUNTER — Encounter: Payer: Self-pay | Admitting: Internal Medicine

## 2019-12-28 ENCOUNTER — Ambulatory Visit
Admission: RE | Admit: 2019-12-28 | Discharge: 2019-12-28 | Disposition: A | Discharge status: Home / Self Care | Payer: Medicare (Managed Care) | Source: Ambulatory Visit | Attending: Internal Medicine | Admitting: Internal Medicine

## 2019-12-28 ENCOUNTER — Other Ambulatory Visit: Payer: Self-pay

## 2019-12-28 VITALS — BP 115/52 | HR 66 | Ht 66.0 in | Wt 154.7 lb

## 2019-12-28 DIAGNOSIS — N138 Other obstructive and reflux uropathy: Secondary | ICD-10-CM

## 2019-12-28 DIAGNOSIS — I10 Essential (primary) hypertension: Secondary | ICD-10-CM

## 2019-12-28 DIAGNOSIS — Z0000 Encounter for general adult medical examination without abnormal findings: Secondary | ICD-10-CM | POA: Diagnosis not present

## 2019-12-28 DIAGNOSIS — N401 Enlarged prostate with lower urinary tract symptoms: Secondary | ICD-10-CM | POA: Diagnosis not present

## 2019-12-28 DIAGNOSIS — M25551 Pain in right hip: Secondary | ICD-10-CM | POA: Diagnosis not present

## 2019-12-28 DIAGNOSIS — M889 Osteitis deformans of unspecified bone: Secondary | ICD-10-CM

## 2019-12-28 DIAGNOSIS — I5030 Unspecified diastolic (congestive) heart failure: Secondary | ICD-10-CM | POA: Insufficient documentation

## 2019-12-28 NOTE — Assessment & Plan Note (Signed)
Prostate was checked and it was found to be normal stool negative for blood.

## 2019-12-28 NOTE — Assessment & Plan Note (Signed)
Patient came in the right hip pain has started suddenly.  There is no history of fall I will get an x-ray of the right hip.  He is also known to have stage disease of the right hip.

## 2019-12-28 NOTE — Assessment & Plan Note (Signed)
Annual physical exam is normal.

## 2019-12-28 NOTE — Progress Notes (Signed)
Established Patient Office Visit  Subjective:  Patient ID: Dean White, male    DOB: Oct 25, 1928  Age: 86 y.o. MRN: [id number]  CC:  Chief Complaint  Patient presents with  . Annual Exam    HPI  Dean White presents fo rpain in rt hip  Past Medical History:  Diagnosis Date  . Adenocarcinoma of prostate ([place])   . BPH with obstruction/lower urinary tract symptoms   . HLD (hyperlipidemia)   . Paget disease of bone     Past Surgical History:  Procedure Laterality Date  . CHOLECYSTECTOMY    . PROSTATE BIOPSY    . PROSTATE SURGERY      Family History  Problem Relation Age of Onset  . Cancer Son        melanoma  . Breast cancer Sister   . Cancer Brother   . Kidney disease Neg Hx   . Prostate cancer Neg Hx     Social History   Socioeconomic History  . Marital status: Divorced    Spouse name: Not on file  . Number of children: Not on file  . Years of education: Not on file  . Highest education level: Not on file  Occupational History  . Not on file  Tobacco Use  . Smoking status: Former Research scientist (life sciences)  . Smokeless tobacco: Never Used  . Tobacco comment: quit 50 years ago  Substance and Sexual Activity  . Alcohol use: No    Alcohol/week: 0.0 standard drinks  . Drug use: No  . Sexual activity: Not on file  Other Topics Concern  . Not on file  Social History Narrative  . Not on file   Social Determinants of Health   Financial Resource Strain: Not on file  Food Insecurity: Not on file  Transportation Needs: Not on file  Physical Activity: Not on file  Stress: Not on file  Social Connections: Not on file  Intimate Partner Violence: Not on file     Current Outpatient Medications:  .  aspirin 81 MG tablet, Take 81 mg by mouth daily., Disp: , Rfl:  .  cholecalciferol (VITAMIN D) 1000 UNITS tablet, Take 1,000 Units by mouth daily., Disp: , Rfl:  .  latanoprost (XALATAN) 0.005 % ophthalmic solution, place 1 drop into both eyes at bedtime, Disp: , Rfl:  0 .  Leuprolide Acetate, 6 Month, (LUPRON) 45 MG injection, Inject 45 mg into the muscle every 6 (six) months., Disp: , Rfl:  .  lovastatin (MEVACOR) 40 MG tablet, TAKE 1 TABLET BY MOUTH DAILY, Disp: 90 tablet, Rfl: 3 .  Misc Natural Products (PROSTATE HEALTH PO), Take by mouth., Disp: , Rfl:  .  Multiple Vitamin (MULTIVITAMIN) tablet, Take 1 tablet by mouth daily., Disp: , Rfl:    No Known Allergies  ROS Review of Systems  Constitutional: Negative.   HENT: Negative.   Eyes: Negative.   Respiratory: Negative.   Cardiovascular: Negative.   Gastrointestinal: Negative.   Endocrine: Negative.   Genitourinary: Negative.   Musculoskeletal: Positive for myalgias.       Pain in rt hip  Skin: Negative.   Allergic/Immunologic: Negative.   Neurological: Negative.   Hematological: Negative.   Psychiatric/Behavioral: Negative.   All other systems reviewed and are negative.     Objective:    Physical Exam Vitals reviewed.  Constitutional:      Appearance: Normal appearance.  HENT:     Mouth/Throat:     Mouth: Mucous membranes are moist.  Eyes:  Pupils: Pupils are equal, round, and reactive to light.  Neck:     Vascular: No carotid bruit.  Cardiovascular:     Rate and Rhythm: Normal rate and regular rhythm.     Pulses: Normal pulses.     Heart sounds: Normal heart sounds.  Pulmonary:     Effort: Pulmonary effort is normal.     Breath sounds: Normal breath sounds.  Abdominal:     General: Bowel sounds are normal.     Palpations: Abdomen is soft. There is no hepatomegaly, splenomegaly or mass.     Tenderness: There is no abdominal tenderness.     Hernia: No hernia is present.  Genitourinary:    Prostate: Normal.  Musculoskeletal:        General: Tenderness present. No swelling or deformity.     Cervical back: Neck supple.     Left lower leg: No edema.     Comments: Tenderness  Rt hip  Skin:    Findings: No rash.  Neurological:     Mental Status: He is alert and  oriented to person, place, and time.     Motor: No weakness.  Psychiatric:        Mood and Affect: Mood normal.        Behavior: Behavior normal.     BP (!) 115/52   Pulse 66   Ht 5\' 6"  (1.676 m)   Wt 154 lb 11.2 oz (70.2 kg)   BMI 24.97 kg/m  Wt Readings from Last 3 Encounters:  12/28/19 154 lb 11.2 oz (70.2 kg)  11/04/19 150 lb (68 kg)  10/26/19 152 lb (68.9 kg)     Health Maintenance Due  Topic Date Due  . TETANUS/TDAP  Never done  . PNA vac Low Risk Adult (1 of 2 - PCV13) Never done    There are no preventive care reminders to display for this patient.  No results found for: TSH Lab Results  Component Value Date   WBC 4.5 06/20/2017   HGB 15.1 06/20/2017   HCT 44.8 06/20/2017   MCV 91.5 06/20/2017   PLT 213 06/20/2017   Lab Results  Component Value Date   NA 138 06/20/2017   K 3.9 06/20/2017   CO2 25 06/20/2017   GLUCOSE 134 (H) 06/20/2017   BUN 14 06/20/2017   CREATININE 0.92 06/20/2017   BILITOT 0.7 06/20/2017   ALKPHOS 73 06/20/2017   AST 23 06/20/2017   ALT 16 (L) 06/20/2017   PROT 6.9 06/20/2017   ALBUMIN 4.1 06/20/2017   CALCIUM 9.4 06/20/2017   ANIONGAP 8 06/20/2017   No results found for: CHOL No results found for: HDL No results found for: LDLCALC No results found for: TRIG No results found for: CHOLHDL No results found for: HGBA1C    Assessment & Plan:   Problem List Items Addressed This Visit      Cardiovascular and Mediastinum   Essential hypertension    - Today, the patient's blood pressure is well managed on present treatment. - The patient will continue the current treatment regimen.  - I encouraged the patient to eat a low-sodium diet to help control blood pressure. - I encouraged the patient to live an active lifestyle and complete activities that increases heart rate to 85% target heart rate at least 5 times per week for one hour.            Musculoskeletal and Integument   Paget's bone disease - Primary     Complaining of pain in  the right hip we will get an x-ray of the right hip.        Genitourinary   BPH with obstruction/lower urinary tract symptoms    Prostate was checked and it was found to be normal stool negative for blood.        Other   Acute right hip pain    Patient came in the right hip pain has started suddenly.  There is no history of fall I will get an x-ray of the right hip.  He is also known to have stage disease of the right hip.      Annual physical exam    Annual physical exam is normal.         No orders of the defined types were placed in this encounter.   Follow-up: No follow-ups on file.    Cletis Athens, MD

## 2019-12-28 NOTE — Assessment & Plan Note (Signed)
-   Today, the patient's blood pressure is well managed on present  treatment. - The patient will continue the current treatment regimen.  - I encouraged the patient to eat a low-sodium diet to help control blood pressure. - I encouraged the patient to live an active lifestyle and complete activities that increases heart rate to 85% target heart rate at least 5 times per week for one hour.     

## 2019-12-28 NOTE — Addendum Note (Signed)
Addended by: Alois Cliche on: 12/28/2019 12:35 PM   Modules accepted: Orders

## 2019-12-28 NOTE — Assessment & Plan Note (Signed)
Complaining of pain in the right hip we will get an x-ray of the right hip.

## 2019-12-29 ENCOUNTER — Encounter: Payer: Self-pay | Admitting: Internal Medicine

## 2019-12-29 ENCOUNTER — Ambulatory Visit (INDEPENDENT_AMBULATORY_CARE_PROVIDER_SITE_OTHER): Payer: Medicare (Managed Care) | Admitting: Internal Medicine

## 2019-12-29 VITALS — BP 122/80 | HR 84 | Ht 66.0 in | Wt 154.0 lb

## 2019-12-29 DIAGNOSIS — M889 Osteitis deformans of unspecified bone: Secondary | ICD-10-CM | POA: Diagnosis not present

## 2019-12-29 NOTE — Progress Notes (Signed)
Established Patient Office Visit  Subjective:  Patient ID: Dean White, male    DOB: Jun 02, 1928  Age: 86 y.o. MRN: [id number]  CC:  Chief Complaint  Patient presents with   xray results    Right hip     HPI  Dean White presents for for follow-up after he had an x-ray done in the hospital.  He complains of pain in the right hip.  He is known to have a Paget's disease of the bone of the back of the right hip in the past and has seen Dr. Alverda Skeans Past Medical History:  Diagnosis Date   Adenocarcinoma of prostate ([place])    BPH with obstruction/lower urinary tract symptoms    HLD (hyperlipidemia)    Paget disease of bone     Past Surgical History:  Procedure Laterality Date   CHOLECYSTECTOMY     PROSTATE BIOPSY     PROSTATE SURGERY      Family History  Problem Relation Age of Onset   Cancer Son        melanoma   Breast cancer Sister    Cancer Brother    Kidney disease Neg Hx    Prostate cancer Neg Hx     Social History   Socioeconomic History   Marital status: Divorced    Spouse name: Not on file   Number of children: Not on file   Years of education: Not on file   Highest education level: Not on file  Occupational History   Not on file  Tobacco Use   Smoking status: Former Smoker   Smokeless tobacco: Never Used   Tobacco comment: quit 50 years ago  Substance and Sexual Activity   Alcohol use: No    Alcohol/week: 0.0 standard drinks   Drug use: No   Sexual activity: Not on file  Other Topics Concern   Not on file  Social History Narrative   Not on file   Social Determinants of Health   Financial Resource Strain: Not on file  Food Insecurity: Not on file  Transportation Needs: Not on file  Physical Activity: Not on file  Stress: Not on file  Social Connections: Not on file  Intimate Partner Violence: Not on file     Current Outpatient Medications:    aspirin 81 MG tablet, Take 81 mg by mouth daily., Disp: ,  Rfl:    cholecalciferol (VITAMIN D) 1000 UNITS tablet, Take 1,000 Units by mouth daily., Disp: , Rfl:    latanoprost (XALATAN) 0.005 % ophthalmic solution, place 1 drop into both eyes at bedtime, Disp: , Rfl: 0   Leuprolide Acetate, 6 Month, (LUPRON) 45 MG injection, Inject 45 mg into the muscle every 6 (six) months., Disp: , Rfl:    lovastatin (MEVACOR) 40 MG tablet, TAKE 1 TABLET BY MOUTH DAILY, Disp: 90 tablet, Rfl: 3   Misc Natural Products (PROSTATE HEALTH PO), Take by mouth., Disp: , Rfl:    Multiple Vitamin (MULTIVITAMIN) tablet, Take 1 tablet by mouth daily., Disp: , Rfl:    No Known Allergies  ROS Review of Systems  Cardiovascular: Negative.   Endocrine: Negative.   Genitourinary: Negative.   Musculoskeletal:       Complain of pain in the right hip on movement.      Objective:    Physical Exam Musculoskeletal:     Comments: Patient complaining of pain in the right hip with movement.  He was able to get up in the office chair is able to  walk to the front desk.       BP 122/80    Pulse 84    Ht 5\' 6"  (1.676 m)    Wt 154 lb (69.9 kg)    BMI 24.86 kg/m  Wt Readings from Last 3 Encounters:  12/29/19 154 lb (69.9 kg)  12/28/19 154 lb 11.2 oz (70.2 kg)  11/04/19 150 lb (68 kg)     Health Maintenance Due  Topic Date Due   TETANUS/TDAP  Never done   PNA vac Low Risk Adult (1 of 2 - PCV13) Never done    There are no preventive care reminders to display for this patient.  No results found for: TSH Lab Results  Component Value Date   WBC 4.5 06/20/2017   HGB 15.1 06/20/2017   HCT 44.8 06/20/2017   MCV 91.5 06/20/2017   PLT 213 06/20/2017   Lab Results  Component Value Date   NA 138 06/20/2017   K 3.9 06/20/2017   CO2 25 06/20/2017   GLUCOSE 134 (H) 06/20/2017   BUN 14 06/20/2017   CREATININE 0.92 06/20/2017   BILITOT 0.7 06/20/2017   ALKPHOS 73 06/20/2017   AST 23 06/20/2017   ALT 16 (L) 06/20/2017   PROT 6.9 06/20/2017   ALBUMIN 4.1 06/20/2017    CALCIUM 9.4 06/20/2017   ANIONGAP 8 06/20/2017   No results found for: CHOL No results found for: HDL No results found for: LDLCALC No results found for: TRIG No results found for: CHOLHDL No results found for: HGBA1C    Assessment & Plan:   Problem List Items Addressed This Visit      Musculoskeletal and Integument   Paget's bone disease - Primary    Patient has Behcet's disease of the bone.  Patient x-ray was reviewed and it does not show any infection.  He will follow up with Dr. Alverda Skeans and the orthopedic surgeon.         No orders of the defined types were placed in this encounter.   Follow-up: No follow-ups on file.    Cletis Athens, MD

## 2019-12-29 NOTE — Assessment & Plan Note (Signed)
Patient has Behcet's disease of the bone.  Patient x-ray was reviewed and it does not show any infection.  He will follow up with Dr. Alverda Skeans and the orthopedic surgeon.

## 2019-12-30 NOTE — Addendum Note (Signed)
Addended by: Alois Cliche on: 12/30/2019 12:16 PM   Modules accepted: Orders

## 2020-02-02 ENCOUNTER — Other Ambulatory Visit: Payer: Self-pay

## 2020-02-02 DIAGNOSIS — C61 Malignant neoplasm of prostate: Secondary | ICD-10-CM

## 2020-02-04 ENCOUNTER — Other Ambulatory Visit: Payer: Self-pay

## 2020-02-04 ENCOUNTER — Other Ambulatory Visit: Payer: Medicare (Managed Care)

## 2020-02-04 DIAGNOSIS — C61 Malignant neoplasm of prostate: Secondary | ICD-10-CM

## 2020-02-05 LAB — PSA: Prostate Specific Ag, Serum: 0.2 ng/mL (ref 0.0–4.0)

## 2020-02-09 ENCOUNTER — Other Ambulatory Visit: Payer: Self-pay | Admitting: *Deleted

## 2020-02-09 DIAGNOSIS — E8881 Metabolic syndrome: Secondary | ICD-10-CM

## 2020-02-11 ENCOUNTER — Telehealth: Payer: Self-pay

## 2020-02-11 NOTE — Telephone Encounter (Signed)
-----   Message from Debroah Loop, [place] sent at 02/11/2020  9:03 AM EST ----- PSA stable, great news. He should keep his scheduled follow up in April. ----- Message ----- From: Interface, [facility] Lab Results In Sent: 02/05/2020   5:37 AM EST To: Debroah Loop, PA-C

## 2020-02-11 NOTE — Telephone Encounter (Signed)
LMOM notifying patient.

## 2020-03-28 ENCOUNTER — Encounter: Payer: Self-pay | Admitting: Internal Medicine

## 2020-03-28 ENCOUNTER — Ambulatory Visit (INDEPENDENT_AMBULATORY_CARE_PROVIDER_SITE_OTHER): Payer: Medicare (Managed Care) | Admitting: Internal Medicine

## 2020-03-28 VITALS — BP 113/71 | HR 84 | Ht 67.0 in | Wt 153.7 lb

## 2020-03-28 DIAGNOSIS — C61 Malignant neoplasm of prostate: Secondary | ICD-10-CM

## 2020-03-28 DIAGNOSIS — E785 Hyperlipidemia, unspecified: Secondary | ICD-10-CM | POA: Insufficient documentation

## 2020-03-28 DIAGNOSIS — I10 Essential (primary) hypertension: Secondary | ICD-10-CM

## 2020-03-28 DIAGNOSIS — N401 Enlarged prostate with lower urinary tract symptoms: Secondary | ICD-10-CM

## 2020-03-28 DIAGNOSIS — M889 Osteitis deformans of unspecified bone: Secondary | ICD-10-CM | POA: Diagnosis not present

## 2020-03-28 DIAGNOSIS — C7951 Secondary malignant neoplasm of bone: Secondary | ICD-10-CM

## 2020-03-28 DIAGNOSIS — E7849 Other hyperlipidemia: Secondary | ICD-10-CM

## 2020-03-28 DIAGNOSIS — N138 Other obstructive and reflux uropathy: Secondary | ICD-10-CM

## 2020-03-28 NOTE — Assessment & Plan Note (Signed)
Refer to urologist.  Report of the scan was discussed with the patient

## 2020-03-28 NOTE — Progress Notes (Signed)
Established Patient Office Visit  Subjective:  Patient ID: Dean White, male    DOB: 1928-09-15  Age: 87 y.o. MRN: [id number]  CC:  Chief Complaint  Patient presents with  . pagets bone disease    HPI  Dean White presents for further evaluation of the x-ray which was done in Dr. Harlow Mares office, x-ray shows mixed lytic and sclerotic lesion in the sacrum and right femur suggestive of either Paget's disease or prostate cancer.  We will schedule the patient for MRI of the back will refer him back to Dr. Dareen Piano and the bone specialist no fracture dislocation or osteolytic lesion were noted.  Past Medical History:  Diagnosis Date  . Adenocarcinoma of prostate ([place])   . BPH with obstruction/lower urinary tract symptoms   . HLD (hyperlipidemia)   . Paget disease of bone     Past Surgical History:  Procedure Laterality Date  . CHOLECYSTECTOMY    . PROSTATE BIOPSY    . PROSTATE SURGERY      Family History  Problem Relation Age of Onset  . Cancer Son        melanoma  . Breast cancer Sister   . Cancer Brother   . Kidney disease Neg Hx   . Prostate cancer Neg Hx     Social History   Socioeconomic History  . Marital status: Divorced    Spouse name: Not on file  . Number of children: Not on file  . Years of education: Not on file  . Highest education level: Not on file  Occupational History  . Not on file  Tobacco Use  . Smoking status: Former Research scientist (life sciences)  . Smokeless tobacco: Never Used  . Tobacco comment: quit 50 years ago  Substance and Sexual Activity  . Alcohol use: No    Alcohol/week: 0.0 standard drinks  . Drug use: No  . Sexual activity: Not on file  Other Topics Concern  . Not on file  Social History Narrative  . Not on file   Social Determinants of Health   Financial Resource Strain: Not on file  Food Insecurity: Not on file  Transportation Needs: Not on file  Physical Activity: Not on file  Stress: Not on file  Social Connections: Not on file   Intimate Partner Violence: Not on file     Current Outpatient Medications:  .  aspirin 81 MG tablet, Take 81 mg by mouth daily., Disp: , Rfl:  .  cholecalciferol (VITAMIN D) 1000 UNITS tablet, Take 1,000 Units by mouth daily., Disp: , Rfl:  .  latanoprost (XALATAN) 0.005 % ophthalmic solution, place 1 drop into both eyes at bedtime, Disp: , Rfl: 0 .  Leuprolide Acetate, 6 Month, (LUPRON) 45 MG injection, Inject 45 mg into the muscle every 6 (six) months., Disp: , Rfl:  .  lovastatin (MEVACOR) 40 MG tablet, TAKE 1 TABLET BY MOUTH DAILY, Disp: 90 tablet, Rfl: 3 .  Misc Natural Products (PROSTATE HEALTH PO), Take by mouth., Disp: , Rfl:  .  Multiple Vitamin (MULTIVITAMIN) tablet, Take 1 tablet by mouth daily., Disp: , Rfl:    No Known Allergies  ROS Review of Systems  Constitutional: Negative.   HENT: Negative.   Eyes: Negative.   Respiratory: Negative.   Cardiovascular: Negative.   Gastrointestinal: Negative.   Endocrine: Negative.   Genitourinary: Negative.   Musculoskeletal: Negative.   Skin: Negative.   Allergic/Immunologic: Negative.   Neurological: Negative.   Hematological: Negative.   Psychiatric/Behavioral: Negative.   All  other systems reviewed and are negative.     Objective:    Physical Exam Vitals reviewed.  Constitutional:      Appearance: Normal appearance.  HENT:     Mouth/Throat:     Mouth: Mucous membranes are moist.  Eyes:     Pupils: Pupils are equal, round, and reactive to light.  Neck:     Vascular: No carotid bruit.  Cardiovascular:     Rate and Rhythm: Normal rate and regular rhythm.     Pulses: Normal pulses.     Heart sounds: Normal heart sounds.  Pulmonary:     Effort: Pulmonary effort is normal.     Breath sounds: Normal breath sounds.  Abdominal:     General: Bowel sounds are normal.     Palpations: Abdomen is soft. There is no hepatomegaly, splenomegaly or mass.     Tenderness: There is no abdominal tenderness.     Hernia: No  hernia is present.  Musculoskeletal:     Cervical back: Neck supple.     Right lower leg: No edema.     Left lower leg: No edema.  Skin:    Findings: No rash.  Neurological:     Mental Status: He is alert and oriented to person, place, and time.     Motor: No weakness.  Psychiatric:        Mood and Affect: Mood normal.        Behavior: Behavior normal.     BP 113/71   Pulse 84   Ht 5\' 7"  (1.702 m)   Wt 153 lb 11.2 oz (69.7 kg)   BMI 24.07 kg/m  Wt Readings from Last 3 Encounters:  03/28/20 153 lb 11.2 oz (69.7 kg)  12/29/19 154 lb (69.9 kg)  12/28/19 154 lb 11.2 oz (70.2 kg)     Health Maintenance Due  Topic Date Due  . TETANUS/TDAP  Never done  . PNA vac Low Risk Adult (1 of 2 - PCV13) Never done    There are no preventive care reminders to display for this patient.  No results found for: TSH Lab Results  Component Value Date   WBC 4.5 06/20/2017   HGB 15.1 06/20/2017   HCT 44.8 06/20/2017   MCV 91.5 06/20/2017   PLT 213 06/20/2017   Lab Results  Component Value Date   NA 138 06/20/2017   K 3.9 06/20/2017   CO2 25 06/20/2017   GLUCOSE 134 (H) 06/20/2017   BUN 14 06/20/2017   CREATININE 0.92 06/20/2017   BILITOT 0.7 06/20/2017   ALKPHOS 73 06/20/2017   AST 23 06/20/2017   ALT 16 (L) 06/20/2017   PROT 6.9 06/20/2017   ALBUMIN 4.1 06/20/2017   CALCIUM 9.4 06/20/2017   ANIONGAP 8 06/20/2017   No results found for: CHOL No results found for: HDL No results found for: LDLCALC No results found for: TRIG No results found for: CHOLHDL No results found for: HGBA1C    Assessment & Plan:   Problem List Items Addressed This Visit      Cardiovascular and Mediastinum   Essential hypertension    Patient blood pressure is normal patient denies any chest pain or shortness of breath there is no history of palpitation paroxysmal nocturnal dyspnea patient can walkioo yards without any problem patient was advised to follow low-salt low-cholesterol diet  I  reviewed the results of Sprint trial  ideally I want to keep systolic blood pressure below 130 mmHg, patient was asked to check blood pressure 3  times a week and give me a report on that.  Patient will be follow-up in 3 months, patient will call me back for any change in the cardiovascular symptoms         Relevant Orders   COMPLETE METABOLIC PANEL WITH GFR   TSH   Lipid panel     Musculoskeletal and Integument   Secondary malignant neoplasm of bone [facility])    Patient was referred to urologist/Dr. Andy Gauss      Paget's bone disease - Primary    Refer to Dr. Francoise Schaumann      Relevant Orders   CBC with Differential/Platelet     Genitourinary   Malignant neoplasm of prostate [facility])    Patient was referred back to urologist      BPH with obstruction/lower urinary tract symptoms    Refer to urologist.  Report of the scan was discussed with the patient        Other   HLD (hyperlipidemia)    Hypercholesterolemia  I advised the patient to follow Mediterranean diet This diet is rich in fruits vegetables and whole grain, and This diet is also rich in fish and lean meat Patient should also eat a handful of almonds or walnuts daily Recent heart study indicated that average follow-up on this kind of diet reduces the cardiovascular mortality by 50 to 70%==         No orders of the defined types were placed in this encounter.  Report  From xray   Progressive mixed lytic and sclerotic changes involving the sacrum, left ilium, proximal right femur in keeping with progressive changes of Paget's disease of bone.  No fracture, dislocation, or osteolytic lesion. Follow-up: No follow-ups on file.    Cletis Athens, MD

## 2020-03-28 NOTE — Assessment & Plan Note (Signed)
Patient was referred to urologist/Dr. Andy Gauss

## 2020-03-28 NOTE — Assessment & Plan Note (Signed)
Patient was referred back to urologist

## 2020-03-28 NOTE — Assessment & Plan Note (Signed)

## 2020-03-28 NOTE — Assessment & Plan Note (Signed)
Hypercholesterolemia  I advised the patient to follow Mediterranean diet This diet is rich in fruits vegetables and whole grain, and This diet is also rich in fish and lean meat Patient should also eat a handful of almonds or walnuts daily Recent heart study indicated that average follow-up on this kind of diet reduces the cardiovascular mortality by 50 to 70%== 

## 2020-03-28 NOTE — Assessment & Plan Note (Signed)
Refer to Dr. Francoise Schaumann

## 2020-03-29 LAB — COMPLETE METABOLIC PANEL WITH GFR
AG Ratio: 2.2 (calc) (ref 1.0–2.5)
ALT: 16 U/L (ref 9–46)
AST: 17 U/L (ref 10–35)
Albumin: 4.3 g/dL (ref 3.6–5.1)
Alkaline phosphatase (APISO): 80 U/L (ref 35–144)
BUN: 17 mg/dL (ref 7–25)
CO2: 24 mmol/L (ref 20–32)
Calcium: 9.3 mg/dL (ref 8.6–10.3)
Chloride: 109 mmol/L (ref 98–110)
Creat: 0.9 mg/dL (ref 0.70–1.11)
GFR, Est African American: 87 mL/min/{1.73_m2} (ref >=60)
GFR, Est Non African American: 75 mL/min/{1.73_m2} (ref >=60)
Globulin: 2 g/dL (calc) (ref 1.9–3.7)
Glucose, Bld: 102 mg/dL — ABNORMAL HIGH (ref 65–99)
Potassium: 4.1 mmol/L (ref 3.5–5.3)
Sodium: 143 mmol/L (ref 135–146)
Total Bilirubin: 0.4 mg/dL (ref 0.2–1.2)
Total Protein: 6.3 g/dL (ref 6.1–8.1)

## 2020-03-29 LAB — CBC WITH DIFFERENTIAL/PLATELET
Absolute Monocytes: 566 cells/uL (ref 200–950)
Basophils Absolute: 73 cells/uL (ref 0–200)
Basophils Relative: 1.3 %
Eosinophils Absolute: 140 cells/uL (ref 15–500)
Eosinophils Relative: 2.5 %
HCT: 46.1 % (ref 38.5–50.0)
Hemoglobin: 14.8 g/dL (ref 13.2–17.1)
Lymphs Abs: 1775 cells/uL (ref 850–3900)
MCH: 29.7 pg (ref 27.0–33.0)
MCHC: 32.1 g/dL (ref 32.0–36.0)
MCV: 92.6 fL (ref 80.0–100.0)
MPV: 11.6 fL (ref 7.5–12.5)
Monocytes Relative: 10.1 %
Neutro Abs: 3046 cells/uL (ref 1500–7800)
Neutrophils Relative %: 54.4 %
Platelets: 252 10*3/uL (ref 140–400)
RBC: 4.98 10*6/uL (ref 4.20–5.80)
RDW: 12.8 % (ref 11.0–15.0)
Total Lymphocyte: 31.7 %
WBC: 5.6 10*3/uL (ref 3.8–10.8)

## 2020-03-29 LAB — LIPID PANEL
Cholesterol: 146 mg/dL (ref <200)
HDL: 60 mg/dL (ref >=40)
LDL Cholesterol (Calc): 65 mg/dL (calc)
Non-HDL Cholesterol (Calc): 86 mg/dL (calc) (ref <130)
Total CHOL/HDL Ratio: 2.4 (calc) (ref <5.0)
Triglycerides: 124 mg/dL (ref <150)

## 2020-03-29 LAB — TSH: TSH: 2.6 mIU/L (ref 0.40–4.50)

## 2020-04-06 ENCOUNTER — Telehealth: Payer: Self-pay

## 2020-04-06 NOTE — Telephone Encounter (Signed)
Eligard/Lupron form faxed for benefits verification.

## 2020-05-04 ENCOUNTER — Other Ambulatory Visit: Payer: Medicare (Managed Care)

## 2020-05-04 ENCOUNTER — Other Ambulatory Visit: Payer: Self-pay

## 2020-05-04 DIAGNOSIS — C61 Malignant neoplasm of prostate: Secondary | ICD-10-CM

## 2020-05-05 LAB — PSA: Prostate Specific Ag, Serum: 0.3 ng/mL (ref 0.0–4.0)

## 2020-05-08 NOTE — Progress Notes (Signed)
05/09/2020 10:33 AM   [name] [birth year] [id number]  Referring provider: Cletis Athens, MD [address] [place],  [place] [address]  Chief Complaint  Patient presents with  . Prostate Cancer   Urological history: 1. 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 Punctate sclerotic foci in the sacrum on CT scan are highly suspicious for metastatic disease and obscured on this exam.  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  Increased activity in the midthoracic spine correlates with degenerative disease and endplate sclerosis at G6-4. Punctate sclerotic focus in T10 on CT scan worrisome for metastatic disease is not visible on this exam.  Mildly increased uptake in the mid shafts of the femurs bilaterally could be due to metastatic disease. -contrast CT 2018 Coarsened trabeculation and fairly classic appearance for Paget's disease involving the pelvis, right proximal femur, and T7 vertebral body, essentially stable from 2009. I am skeptical of superimposed metastatic disease in these regions.  Superior endplate sclerosis at T8 appears to be geode degenerative. Small sclerotic lesions in the T10 vertebral body are stable from 2009 an while these could be from old prostate metastatic disease they could also simply be bone islands.  Faint makes density along the inferior tip of the scapula is nonspecific, and likely merit surveillance. However, this area does not appear to have significant asymmetric activity on today' s bone scan.  The left upper lobe pulmonary nodules of concern have been stable since 2012, and are likely benign  No adenopathy in the chest, abdomen, or pelvis. -TRUSPBx of prostate 11/2008 noted a Gleason's 6 (3+3) in one core with a PSA of 5.5 ng/mL and  a prostate volume of 50cc -receiving ADT    HPI: Dean White is a 87 y.o. male with metastatic prostate cancer who presents today for his 6 month follow up.    He has baseline urinary complaints of incontinence, but it is not bothersome to him.  Patient denies any modifying or aggravating factors.  Patient denies any gross hematuria, dysuria or suprapubic/flank pain.  Patient denies any fevers, chills, nausea or vomiting.   He would like to continue the ADT therapy at this time as he is not having significant hot flashes or fatigue.    PMH: Past Medical History:  Diagnosis Date  . Adenocarcinoma of prostate ([place])   . BPH with obstruction/lower urinary tract symptoms   . HLD (hyperlipidemia)   . Paget disease of bone     Surgical History: Past Surgical History:  Procedure Laterality Date  . CHOLECYSTECTOMY    . PROSTATE BIOPSY    . PROSTATE SURGERY      Home Medications:  Allergies as of 05/09/2020   No Known Allergies     Medication List       Accurate as of May 09, 2020 10:33 AM. If you have any questions, ask your nurse or doctor.        aspirin 81 MG tablet Take 81 mg by mouth daily.   cholecalciferol 1000 units tablet Commonly known as: VITAMIN D Take 1,000 Units by mouth daily.   latanoprost 0.005 % ophthalmic solution Commonly known as: XALATAN place 1 drop into both eyes at bedtime   Leuprolide Acetate (6 Month) 45 MG injection Commonly known as: LUPRON Inject 45  mg into the muscle every 6 (six) months.   lovastatin 40 MG tablet Commonly known as: MEVACOR TAKE 1 TABLET BY MOUTH DAILY   methylPREDNISolone 4 MG Tbpk tablet Commonly known as: MEDROL DOSEPAK See admin instructions. follow package directions   multivitamin tablet Take 1 tablet by mouth daily.   PROSTATE HEALTH PO Take by mouth.       Allergies: No Known Allergies  Family History: Family History  Problem Relation Age of Onset  . Cancer Son        melanoma  . Breast  cancer Sister   . Cancer Brother   . Kidney disease Neg Hx   . Prostate cancer Neg Hx     Social History:  reports that he has quit smoking. He has never used smokeless tobacco. He reports that he does not drink alcohol and does not use drugs.  ROS: For pertinent review of systems please refer to history of present illness  Physical Exam: BP 101/62   Pulse (!) 112   Ht 5\' 7"  (1.702 m)   Wt 150 lb 12.8 oz (68.4 kg)   BMI 23.62 kg/m   Constitutional:  Well nourished. Alert and oriented, No acute distress. HEENT: [place] AT, mask in place.  Trachea midline Cardiovascular: No clubbing, cyanosis, or edema. Respiratory: Normal respiratory effort, no increased work of breathing. Neurologic: Grossly intact, no focal deficits, moving all 4 extremities. Psychiatric: Normal mood and affect.   Laboratory Data: PSA History:   7.3 ng/mL on 00/00/2013   9.4 ng/mL on 08/00/2013   8.3 ng/mL on 02/00/2014  11.3 ng/mL on 09/00/2014- Patient was started on ADT and Xgeva   2.2 ng/mL on 09/13/2013  13.3 ng/mL on 03/18/2014-Patient had discontinued ADT     2.0 ng/mL on 05/00/2016     1.2 ng/ml on 12/30/2014      0.6 ng/mL in 07/2015 - patient discontinued ADT      8.7 ng/mL in 01/2016     13.4 ng/mL in 02/2016     1.10 ng/mL in 05/2016  0.86 in 08/2016 - restarted Lupron  0.45 in 11/2016  0.48 in 02/2017  0.36 in 06/2017  0.5 in 08/2017  0.3 in 11/2017  0.6 in 03/2018  0.6 in 05/2018    Component     Latest Ref Rng & Units 10/06/2014 07/20/2015  Prostate Specific Ag, Serum     0.0 - 4.0 ng/mL 1.4 0.6    Component     Latest Ref Rng & Units 01/22/2016 02/26/2016 09/04/2017 12/03/2017  Prostate Specific Ag, Serum     0.0 - 4.0 ng/mL 8.7 (H) 13.4 (H) 0.5 0.3   Component     Latest Ref Rng & Units 04/01/2018 06/10/2018 01/05/2019 04/21/2019  Prostate Specific Ag, Serum     0.0 - 4.0 ng/mL 0.3 0.6 0.3 0.2   Lab Results  Component Value Date   WBC 5.6 03/28/2020   HGB 14.8 03/28/2020    HCT 46.1 03/28/2020   MCV 92.6 03/28/2020   PLT 252 03/28/2020    Lab Results  Component Value Date   CREATININE 0.90 03/28/2020    Lab Results  Component Value Date   PSA 1.10 06/06/2016   PSA 1.95 04/11/2016    Lab Results  Component Value Date   AST 17 03/28/2020   Lab Results  Component Value Date   ALT 16 03/28/2020  I have reviewed the labs.  Assessment & Plan:    1. Prostate cancer -Continue ADT   2. 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 Janese Banks in 06/2017 and he has been released back into our care with recommendations of checking PSA q 3 months/repeat imaging only if worsening clinical signs or rising PSA  3. Gynecomastia -Not bothersome to the patient  -Does not want to pursue further evaluation or treatment   No follow-ups on file.  [name], PA-C  [facility] [address] [place], [place] [address] [phone]  [name], PA-C  I spent 15 minutes on the day of the encounter to include pre-visit record review, face-to-face time with the patient, and post-visit ordering of tests.

## 2020-05-09 ENCOUNTER — Ambulatory Visit (INDEPENDENT_AMBULATORY_CARE_PROVIDER_SITE_OTHER): Payer: Medicare Other | Admitting: Urology

## 2020-05-09 ENCOUNTER — Encounter: Payer: Self-pay | Admitting: Urology

## 2020-05-09 ENCOUNTER — Other Ambulatory Visit: Payer: Self-pay

## 2020-05-09 VITALS — BP 101/62 | HR 112 | Ht 67.0 in | Wt 150.8 lb

## 2020-05-09 DIAGNOSIS — C7951 Secondary malignant neoplasm of bone: Secondary | ICD-10-CM

## 2020-05-09 DIAGNOSIS — N62 Hypertrophy of breast: Secondary | ICD-10-CM | POA: Diagnosis not present

## 2020-05-09 DIAGNOSIS — C61 Malignant neoplasm of prostate: Secondary | ICD-10-CM

## 2020-05-09 MED ORDER — LEUPROLIDE ACETATE (6 MONTH) 45 MG ~~LOC~~ KIT
45.0000 mg | PACK | Freq: Once | SUBCUTANEOUS | Status: AC
Start: 2020-05-09 — End: 2020-05-09
  Administered 2020-05-09: 45 mg via SUBCUTANEOUS

## 2020-05-09 NOTE — Progress Notes (Signed)
Eligard SubQ Injection   Due to Prostate Cancer patient is present today for a Eligard Injection.  Medication: Eligard 6 month Dose: 45 mg  Location: right  Lot: [id number] Exp: 08/2021  Patient tolerated well, no complications were noted  Performed by: Elberta Leatherwood, [place]  Per Dr. Larene Beach patient is to continue therapy for for life. Patient's next follow up was scheduled for lab work in 3 months and Eligard is scheduled for 11/06/2020. This appointment was scheduled using wheel and given to patient today along with reminder continue on Vitamin D 800-1000iu and Calium 1000-1200mg  daily while on Androgen Deprivation Therapy.

## 2020-05-09 NOTE — Telephone Encounter (Signed)
PA initiated via phone, clinical notes faxed to [phone]. Case #[id number]. To complete PA [phone] option #5.

## 2020-05-10 ENCOUNTER — Telehealth: Payer: Self-pay

## 2020-05-10 NOTE — Telephone Encounter (Signed)
Incoming approval for [employer], 12units approved. Approval dates: 05/09/20 - 05/08/2021.

## 2020-05-10 NOTE — Telephone Encounter (Signed)
-----   Message from Kyra Manges, [place] sent at 05/09/2020 10:49 AM EDT ----- Patient is scheduled for Eligard on 11/06/20

## 2020-06-28 ENCOUNTER — Encounter: Payer: Self-pay | Admitting: Internal Medicine

## 2020-06-28 ENCOUNTER — Ambulatory Visit (INDEPENDENT_AMBULATORY_CARE_PROVIDER_SITE_OTHER): Payer: BLUE CROSS/BLUE SHIELD | Admitting: Internal Medicine

## 2020-06-28 ENCOUNTER — Other Ambulatory Visit: Payer: Self-pay

## 2020-06-28 VITALS — BP 118/65 | HR 87 | Ht 67.0 in | Wt 153.7 lb

## 2020-06-28 DIAGNOSIS — I10 Essential (primary) hypertension: Secondary | ICD-10-CM | POA: Diagnosis not present

## 2020-06-28 DIAGNOSIS — C61 Malignant neoplasm of prostate: Secondary | ICD-10-CM

## 2020-06-28 DIAGNOSIS — M889 Osteitis deformans of unspecified bone: Secondary | ICD-10-CM | POA: Diagnosis not present

## 2020-06-28 DIAGNOSIS — E7849 Other hyperlipidemia: Secondary | ICD-10-CM

## 2020-06-28 DIAGNOSIS — M25551 Pain in right hip: Secondary | ICD-10-CM

## 2020-06-28 NOTE — Assessment & Plan Note (Signed)
Patient is being followed up by neurologist.

## 2020-06-28 NOTE — Assessment & Plan Note (Signed)
Hypercholesterolemia  I advised the patient to follow Mediterranean diet This diet is rich in fruits vegetables and whole grain, and This diet is also rich in fish and lean meat Patient should also eat a handful of almonds or walnuts daily Recent heart study indicated that average follow-up on this kind of diet reduces the cardiovascular mortality by 50 to 70%== 

## 2020-06-28 NOTE — Assessment & Plan Note (Signed)
Refer to orthopedics.  Patient has not had any history of pain in the right hip he can walk without any difficulty.  He does not smoke does not drink.  There is no history of injury to the right hip.

## 2020-06-28 NOTE — Assessment & Plan Note (Signed)

## 2020-06-28 NOTE — Progress Notes (Signed)
Established Patient Office Visit  Subjective:  Patient ID: Dean White, male    DOB: Sep 07, 1928  Age: 87 y.o. MRN: [id number]  CC:  Chief Complaint  Patient presents with   Follow-up    Hip Pain  The incident occurred more than 1 week ago. There was no injury mechanism. The pain is at a severity of 3/10. The pain is mild. The pain has been Fluctuating since onset. Pertinent negatives include no muscle weakness, numbness or tingling.      Past Medical History:  Diagnosis Date   Adenocarcinoma of prostate ([place])    BPH with obstruction/lower urinary tract symptoms    HLD (hyperlipidemia)    Paget disease of bone     Past Surgical History:  Procedure Laterality Date   CHOLECYSTECTOMY     PROSTATE BIOPSY     PROSTATE SURGERY      Family History  Problem Relation Age of Onset   Cancer Son        melanoma   Breast cancer Sister    Cancer Brother    Kidney disease Neg Hx    Prostate cancer Neg Hx     Social History   Socioeconomic History   Marital status: Divorced    Spouse name: Not on file   Number of children: Not on file   Years of education: Not on file   Highest education level: Not on file  Occupational History   Not on file  Tobacco Use   Smoking status: Former    Pack years: 0.00   Smokeless tobacco: Never   Tobacco comments:    quit 50 years ago  Substance and Sexual Activity   Alcohol use: No    Alcohol/week: 0.0 standard drinks   Drug use: No   Sexual activity: Not on file  Other Topics Concern   Not on file  Social History Narrative   Not on file   Social Determinants of Health   Financial Resource Strain: Not on file  Food Insecurity: Not on file  Transportation Needs: Not on file  Physical Activity: Not on file  Stress: Not on file  Social Connections: Not on file  Intimate Partner Violence: Not on file     Current Outpatient Medications:    aspirin 81 MG tablet, Take 81 mg by mouth daily., Disp: , Rfl:     cholecalciferol (VITAMIN D) 1000 UNITS tablet, Take 1,000 Units by mouth daily., Disp: , Rfl:    latanoprost (XALATAN) 0.005 % ophthalmic solution, place 1 drop into both eyes at bedtime, Disp: , Rfl: 0   Leuprolide Acetate, 6 Month, (LUPRON) 45 MG injection, Inject 45 mg into the muscle every 6 (six) months., Disp: , Rfl:    lovastatin (MEVACOR) 40 MG tablet, TAKE 1 TABLET BY MOUTH DAILY, Disp: 90 tablet, Rfl: 3   methylPREDNISolone (MEDROL DOSEPAK) 4 MG TBPK tablet, See admin instructions. follow package directions, Disp: , Rfl:    Misc Natural Products (PROSTATE HEALTH PO), Take by mouth., Disp: , Rfl:    Multiple Vitamin (MULTIVITAMIN) tablet, Take 1 tablet by mouth daily., Disp: , Rfl:    No Known Allergies  ROS Review of Systems  Constitutional: Negative.   HENT: Negative.    Eyes: Negative.   Respiratory: Negative.    Cardiovascular: Negative.   Gastrointestinal: Negative.   Endocrine: Negative.   Genitourinary: Negative.   Musculoskeletal: Negative.   Skin: Negative.   Allergic/Immunologic: Negative.   Neurological: Negative.  Negative for tingling and numbness.  Hematological: Negative.   Psychiatric/Behavioral: Negative.    All other systems reviewed and are negative.    Objective:    Physical Exam Vitals reviewed.  Constitutional:      Appearance: Normal appearance.  HENT:     Mouth/Throat:     Mouth: Mucous membranes are moist.  Eyes:     Pupils: Pupils are equal, round, and reactive to light.  Neck:     Vascular: No carotid bruit.  Cardiovascular:     Rate and Rhythm: Normal rate and regular rhythm.     Pulses: Normal pulses.     Heart sounds: Normal heart sounds.  Pulmonary:     Effort: Pulmonary effort is normal.     Breath sounds: Normal breath sounds.  Abdominal:     General: Bowel sounds are normal.     Palpations: Abdomen is soft. There is no hepatomegaly, splenomegaly or mass.     Tenderness: There is no abdominal tenderness.     Hernia: No  hernia is present.  Musculoskeletal:     Cervical back: Neck supple.     Right lower leg: No edema.     Left lower leg: No edema.  Skin:    Findings: No rash.  Neurological:     Mental Status: He is alert and oriented to person, place, and time.     Motor: No weakness.  Psychiatric:        Mood and Affect: Mood normal.        Behavior: Behavior normal.   Pain in the right hip x-ray is abnormal BP 118/65   Pulse 87   Ht 5\' 7"  (1.702 m)   Wt 153 lb 11.2 oz (69.7 kg)   BMI 24.07 kg/m  Wt Readings from Last 3 Encounters:  06/28/20 153 lb 11.2 oz (69.7 kg)  05/09/20 150 lb 12.8 oz (68.4 kg)  03/28/20 153 lb 11.2 oz (69.7 kg)     Health Maintenance Due  Topic Date Due   TETANUS/TDAP  Never done   Zoster Vaccines- Shingrix (1 of 2) Never done   PNA vac Low Risk Adult (1 of 2 - PCV13) Never done   COVID-19 Vaccine (4 - Booster for Pfizer series) 01/15/2020    There are no preventive care reminders to display for this patient.  Lab Results  Component Value Date   TSH 2.60 03/28/2020   Lab Results  Component Value Date   WBC 5.6 03/28/2020   HGB 14.8 03/28/2020   HCT 46.1 03/28/2020   MCV 92.6 03/28/2020   PLT 252 03/28/2020   Lab Results  Component Value Date   NA 143 03/28/2020   K 4.1 03/28/2020   CO2 24 03/28/2020   GLUCOSE 102 (H) 03/28/2020   BUN 17 03/28/2020   CREATININE 0.90 03/28/2020   BILITOT 0.4 03/28/2020   ALKPHOS 73 06/20/2017   AST 17 03/28/2020   ALT 16 03/28/2020   PROT 6.3 03/28/2020   ALBUMIN 4.1 06/20/2017   CALCIUM 9.3 03/28/2020   ANIONGAP 8 06/20/2017   Lab Results  Component Value Date   CHOL 146 03/28/2020   Lab Results  Component Value Date   HDL 60 03/28/2020   Lab Results  Component Value Date   LDLCALC 65 03/28/2020   Lab Results  Component Value Date   TRIG 124 03/28/2020   Lab Results  Component Value Date   CHOLHDL 2.4 03/28/2020   No results found for: HGBA1C    Assessment & Plan:   Problem List Items  Addressed This Visit       Cardiovascular and Mediastinum   Essential hypertension    Patient blood pressure is normal patient denies any chest pain or shortness of breath there is no history of palpitation or paroxysmal nocturnal dyspnea   patient was advised to follow low-salt low-cholesterol diet    ideally I want to keep systolic blood pressure below 130 mmHg, patient was asked to check blood pressure one times a week and give me a report on that.  Patient will be follow-up in 3 months  or earlier as needed, patient will call me back for any change in the cardiovascular symptoms Patient was advised to buy a book from local bookstore concerning blood pressure and read several chapters  every day.  This will be supplemented by some of the material we will give him from the office.  Patient should also utilize other resources like YouTube and Internet to learn more about the blood pressure and the diet.         Musculoskeletal and Integument   Paget's bone disease - Primary    Refer to orthopedics.  Patient has not had any history of pain in the right hip he can walk without any difficulty.  He does not smoke does not drink.  There is no history of injury to the right hip.         Genitourinary   Malignant neoplasm of prostate [facility])    Patient is being followed up by neurologist.         Other   Acute right hip pain    .  Pain in the right  hip Is stable most of the time.       HLD (hyperlipidemia)    Hypercholesterolemia  I advised the patient to follow Mediterranean diet This diet is rich in fruits vegetables and whole grain, and This diet is also rich in fish and lean meat Patient should also eat a handful of almonds or walnuts daily Recent heart study indicated that average follow-up on this kind of diet reduces the cardiovascular mortality by 50 to 70%==        No orders of the defined types were placed in this encounter.   Follow-up: No follow-ups on file.     Cletis Athens, MD

## 2020-06-28 NOTE — Assessment & Plan Note (Addendum)
.    Pain in the right  hip Is stable most of the time.

## 2020-08-07 ENCOUNTER — Other Ambulatory Visit: Payer: Self-pay

## 2020-08-07 DIAGNOSIS — C61 Malignant neoplasm of prostate: Secondary | ICD-10-CM

## 2020-08-08 ENCOUNTER — Other Ambulatory Visit: Payer: Self-pay

## 2020-08-08 ENCOUNTER — Other Ambulatory Visit: Payer: Medicare (Managed Care)

## 2020-08-08 DIAGNOSIS — C61 Malignant neoplasm of prostate: Secondary | ICD-10-CM

## 2020-08-09 LAB — PSA: Prostate Specific Ag, Serum: 0.2 ng/mL (ref 0.0–4.0)

## 2020-09-22 ENCOUNTER — Other Ambulatory Visit: Payer: Self-pay | Admitting: Internal Medicine

## 2020-09-26 ENCOUNTER — Encounter: Payer: Self-pay | Admitting: Internal Medicine

## 2020-09-26 ENCOUNTER — Ambulatory Visit (INDEPENDENT_AMBULATORY_CARE_PROVIDER_SITE_OTHER): Payer: BLUE CROSS/BLUE SHIELD | Admitting: Internal Medicine

## 2020-09-26 ENCOUNTER — Other Ambulatory Visit: Payer: Self-pay

## 2020-09-26 VITALS — BP 106/64 | HR 54 | Ht 67.0 in | Wt 148.7 lb

## 2020-09-26 DIAGNOSIS — C61 Malignant neoplasm of prostate: Secondary | ICD-10-CM | POA: Diagnosis not present

## 2020-09-26 DIAGNOSIS — Z23 Encounter for immunization: Secondary | ICD-10-CM | POA: Diagnosis not present

## 2020-09-26 DIAGNOSIS — E7849 Other hyperlipidemia: Secondary | ICD-10-CM

## 2020-09-26 DIAGNOSIS — M889 Osteitis deformans of unspecified bone: Secondary | ICD-10-CM | POA: Diagnosis not present

## 2020-09-26 DIAGNOSIS — I10 Essential (primary) hypertension: Secondary | ICD-10-CM

## 2020-09-26 NOTE — Progress Notes (Signed)
Established Patient Office Visit  Subjective:  Patient ID: Dean White, male    DOB: 10-24-1928  Age: 87 y.o. MRN: [id number]  CC:  Chief Complaint  Patient presents with   Follow-up    3 month follow up    HPI  Dean White presents for check up  Past Medical History:  Diagnosis Date   Adenocarcinoma of prostate ([place])    BPH with obstruction/lower urinary tract symptoms    HLD (hyperlipidemia)    Paget disease of bone     Past Surgical History:  Procedure Laterality Date   CHOLECYSTECTOMY     PROSTATE BIOPSY     PROSTATE SURGERY      Family History  Problem Relation Age of Onset   Cancer Son        melanoma   Breast cancer Sister    Cancer Brother    Kidney disease Neg Hx    Prostate cancer Neg Hx     Social History   Socioeconomic History   Marital status: Divorced    Spouse name: Not on file   Number of children: Not on file   Years of education: Not on file   Highest education level: Not on file  Occupational History   Not on file  Tobacco Use   Smoking status: Former   Smokeless tobacco: Never   Tobacco comments:    quit 50 years ago  Substance and Sexual Activity   Alcohol use: No    Alcohol/week: 0.0 standard drinks   Drug use: No   Sexual activity: Not on file  Other Topics Concern   Not on file  Social History Narrative   Not on file   Social Determinants of Health   Financial Resource Strain: Not on file  Food Insecurity: Not on file  Transportation Needs: Not on file  Physical Activity: Not on file  Stress: Not on file  Social Connections: Not on file  Intimate Partner Violence: Not on file     Current Outpatient Medications:    cholecalciferol (VITAMIN D) 1000 UNITS tablet, Take 1,000 Units by mouth daily., Disp: , Rfl:    latanoprost (XALATAN) 0.005 % ophthalmic solution, place 1 drop into both eyes at bedtime, Disp: , Rfl: 0   aspirin 81 MG tablet, Take 81 mg by mouth daily. (Patient not taking: Reported on  09/26/2020), Disp: , Rfl:    Leuprolide Acetate, 6 Month, (LUPRON) 45 MG injection, Inject 45 mg into the muscle every 6 (six) months., Disp: , Rfl:    lovastatin (MEVACOR) 40 MG tablet, TAKE 1 TABLET BY MOUTH DAILY, Disp: 90 tablet, Rfl: 3   methylPREDNISolone (MEDROL DOSEPAK) 4 MG TBPK tablet, See admin instructions. follow package directions, Disp: , Rfl:    Misc Natural Products (PROSTATE HEALTH PO), Take by mouth., Disp: , Rfl:    Multiple Vitamin (MULTIVITAMIN) tablet, Take 1 tablet by mouth daily., Disp: , Rfl:    No Known Allergies  ROS Review of Systems  Constitutional: Negative.   HENT: Negative.    Eyes: Negative.   Respiratory: Negative.    Cardiovascular: Negative.   Gastrointestinal: Negative.   Endocrine: Negative.   Genitourinary: Negative.   Musculoskeletal: Negative.   Skin: Negative.   Allergic/Immunologic: Negative.   Neurological: Negative.   Hematological: Negative.   Psychiatric/Behavioral: Negative.    All other systems reviewed and are negative.    Objective:    Physical Exam  BP 106/64   Pulse (!) 54   Ht 5'  7" (1.702 m)   Wt 148 lb 11.2 oz (67.4 kg)   BMI 23.29 kg/m  Wt Readings from Last 3 Encounters:  09/26/20 148 lb 11.2 oz (67.4 kg)  06/28/20 153 lb 11.2 oz (69.7 kg)  05/09/20 150 lb 12.8 oz (68.4 kg)     Health Maintenance Due  Topic Date Due   TETANUS/TDAP  Never done   Zoster Vaccines- Shingrix (1 of 2) Never done   PNA vac Low Risk Adult (1 of 2 - PCV13) Never done   COVID-19 Vaccine (4 - Booster for Pfizer series) 01/07/2020   INFLUENZA VACCINE  08/14/2020    There are no preventive care reminders to display for this patient.  Lab Results  Component Value Date   TSH 2.60 03/28/2020   Lab Results  Component Value Date   WBC 5.6 03/28/2020   HGB 14.8 03/28/2020   HCT 46.1 03/28/2020   MCV 92.6 03/28/2020   PLT 252 03/28/2020   Lab Results  Component Value Date   NA 143 03/28/2020   K 4.1 03/28/2020   CO2 24  03/28/2020   GLUCOSE 102 (H) 03/28/2020   BUN 17 03/28/2020   CREATININE 0.90 03/28/2020   BILITOT 0.4 03/28/2020   ALKPHOS 73 06/20/2017   AST 17 03/28/2020   ALT 16 03/28/2020   PROT 6.3 03/28/2020   ALBUMIN 4.1 06/20/2017   CALCIUM 9.3 03/28/2020   ANIONGAP 8 06/20/2017   Lab Results  Component Value Date   CHOL 146 03/28/2020   Lab Results  Component Value Date   HDL 60 03/28/2020   Lab Results  Component Value Date   LDLCALC 65 03/28/2020   Lab Results  Component Value Date   TRIG 124 03/28/2020   Lab Results  Component Value Date   CHOLHDL 2.4 03/28/2020   No results found for: HGBA1C    Assessment & Plan:   Problem List Items Addressed This Visit       Cardiovascular and Mediastinum   Essential hypertension - Primary     Patient denies any chest pain or shortness of breath there is no history of palpitation or paroxysmal nocturnal dyspnea   patient was advised to follow low-salt low-cholesterol diet    ideally I want to keep systolic blood pressure below 130 mmHg, patient was asked to check blood pressure one times a week and give me a report on that.  Patient will be follow-up in 3 months  or earlier as needed, patient will call me back for any change in the cardiovascular symptoms Patient was advised to buy a book from local bookstore concerning blood pressure and read several chapters  every day.  This will be supplemented by some of the material we will give him from the office.  Patient should also utilize other resources like YouTube and Internet to learn more about the blood pressure and the diet.   Chemistry      Component Value Date/Time   NA 143 03/28/2020 1040   K 4.1 03/28/2020 1040   CL 109 03/28/2020 1040   CO2 24 03/28/2020 1040   BUN 17 03/28/2020 1040   CREATININE 0.90 03/28/2020 1040      Component Value Date/Time   CALCIUM 9.3 03/28/2020 1040   ALKPHOS 73 06/20/2017 1414   AST 17 03/28/2020 1040   ALT 16 03/28/2020 1040    BILITOT 0.4 03/28/2020 1040             Musculoskeletal and Integument   Paget's bone disease  Stable at the present time        Genitourinary   Malignant neoplasm of prostate ([name])    We will repeat the blood test        Other   Need for influenza vaccination    Patient want to get a flu shot      HLD (hyperlipidemia)    Hypercholesterolemia  I advised the patient to follow Mediterranean diet This diet is rich in fruits vegetables and whole grain, and This diet is also rich in fish and lean meat Patient should also eat a handful of almonds or walnuts daily Recent heart study indicated that average follow-up on this kind of diet reduces the cardiovascular mortality by 50 to 70%== Lab Results  Component Value Date   CHOL 146 03/28/2020   Lab Results  Component Value Date   HDL 60 03/28/2020   Lab Results  Component Value Date   LDLCALC 65 03/28/2020   Lab Results  Component Value Date   TRIG 124 03/28/2020   Lab Results  Component Value Date   CHOLHDL 2.4 03/28/2020   No results found for: LDLDIRECT        No orders of the defined types were placed in this encounter.   Follow-up: No follow-ups on file.    Cletis Athens, MD

## 2020-09-26 NOTE — Assessment & Plan Note (Signed)
Stable at the present time. 

## 2020-09-26 NOTE — Assessment & Plan Note (Addendum)
Lab Results  Component Value Date   TSH 2.60 03/28/2020

## 2020-09-26 NOTE — Assessment & Plan Note (Signed)
Patient denies any chest pain or shortness of breath there is no history of palpitation or paroxysmal nocturnal dyspnea   patient was advised to follow low-salt low-cholesterol diet    ideally I want to keep systolic blood pressure below 130 mmHg, patient was asked to check blood pressure one times a week and give me a report on that.  Patient will be follow-up in 3 months  or earlier as needed, patient will call me back for any change in the cardiovascular symptoms Patient was advised to buy a book from local bookstore concerning blood pressure and read several chapters  every day.  This will be supplemented by some of the material we will give him from the office.  Patient should also utilize other resources like YouTube and Internet to learn more about the blood pressure and the diet.   Chemistry      Component Value Date/Time   NA 143 03/28/2020 1040   K 4.1 03/28/2020 1040   CL 109 03/28/2020 1040   CO2 24 03/28/2020 1040   BUN 17 03/28/2020 1040   CREATININE 0.90 03/28/2020 1040      Component Value Date/Time   CALCIUM 9.3 03/28/2020 1040   ALKPHOS 73 06/20/2017 1414   AST 17 03/28/2020 1040   ALT 16 03/28/2020 1040   BILITOT 0.4 03/28/2020 1040

## 2020-09-26 NOTE — Assessment & Plan Note (Signed)
Patient want to get a flu shot

## 2020-09-26 NOTE — Assessment & Plan Note (Signed)
Hypercholesterolemia  I advised the patient to follow Mediterranean diet This diet is rich in fruits vegetables and whole grain, and This diet is also rich in fish and lean meat Patient should also eat a handful of almonds or walnuts daily Recent heart study indicated that average follow-up on this kind of diet reduces the cardiovascular mortality by 50 to 70%== Lab Results  Component Value Date   CHOL 146 03/28/2020   Lab Results  Component Value Date   HDL 60 03/28/2020   Lab Results  Component Value Date   LDLCALC 65 03/28/2020   Lab Results  Component Value Date   TRIG 124 03/28/2020   Lab Results  Component Value Date   CHOLHDL 2.4 03/28/2020   No results found for: LDLDIRECT

## 2020-11-06 ENCOUNTER — Ambulatory Visit: Payer: Self-pay

## 2020-11-07 ENCOUNTER — Ambulatory Visit (INDEPENDENT_AMBULATORY_CARE_PROVIDER_SITE_OTHER): Payer: Medicare Other | Admitting: *Deleted

## 2020-11-07 ENCOUNTER — Other Ambulatory Visit: Payer: Self-pay

## 2020-11-07 DIAGNOSIS — C61 Malignant neoplasm of prostate: Secondary | ICD-10-CM | POA: Diagnosis not present

## 2020-11-07 MED ORDER — LEUPROLIDE ACETATE (6 MONTH) 45 MG ~~LOC~~ KIT
45.0000 mg | PACK | Freq: Once | SUBCUTANEOUS | Status: AC
  Administered 2020-11-07: 45 mg via SUBCUTANEOUS

## 2020-11-07 NOTE — Progress Notes (Signed)
Eligard SubQ Injection    Due to Prostate Cancer patient is present today for a Eligard Injection.   Medication: Eligard 6 month Dose: 45 mg  Location: right  Lot: [id number] Exp: 04/11/2022   Patient tolerated well, no complications were noted   Performed by: Gaspar Cola, [place]   Per Dr. Larene Beach patient is to continue therapy for for life. Patient's next follow up was scheduled for lab work in 3 months and Eligard is scheduled for 05/03/21  This appointment was scheduled using wheel and given to patient today along with reminder continue on Vitamin D 800-1000iu and Calium 1000-1200mg  daily while on Androgen Deprivation Therapy.

## 2020-12-28 ENCOUNTER — Ambulatory Visit (INDEPENDENT_AMBULATORY_CARE_PROVIDER_SITE_OTHER): Payer: BLUE CROSS/BLUE SHIELD | Admitting: *Deleted

## 2020-12-28 DIAGNOSIS — Z0000 Encounter for general adult medical examination without abnormal findings: Secondary | ICD-10-CM | POA: Diagnosis not present

## 2020-12-28 NOTE — Progress Notes (Signed)
Subjective:   Dean White is a 87 y.o. male who presents for Medicare Annual/Subsequent preventive examination.   I discussed the limitations of evaluation and management by telemedicine and the availability of in person appointments. Patient expressed understanding and agreed to proceed.   Visit performed using audio  Patient:home Provider:home  Review of Systems    Defer to provider  Cardiac Risk Factors include: none     Objective:    There were no vitals filed for this visit. There is no height or weight on file to calculate BMI.  Advanced Directives 12/28/2020 06/20/2017 09/10/2016 04/11/2016  Does Patient Have a Medical Advance Directive? No Yes No;Yes Yes  Type of Advance Directive - Living will [place];Living will Living will  Does patient want to make changes to medical advance directive? - No - Patient declined - -  Copy of [place] in Chart? - - No - copy requested -  Would patient like information on creating a medical advance directive? No - Patient declined - No - Patient declined -    Current Medications (verified) Outpatient Encounter Medications as of 12/28/2020  Medication Sig   cholecalciferol (VITAMIN D) 1000 UNITS tablet Take 1,000 Units by mouth daily.   latanoprost (XALATAN) 0.005 % ophthalmic solution place 1 drop into both eyes at bedtime   Leuprolide Acetate, 6 Month, (LUPRON) 45 MG injection Inject 45 mg into the muscle every 6 (six) months.   lovastatin (MEVACOR) 40 MG tablet TAKE 1 TABLET BY MOUTH DAILY   Misc Natural Products (PROSTATE HEALTH PO) Take by mouth.   Multiple Vitamin (MULTIVITAMIN) tablet Take 1 tablet by mouth daily.   [DISCONTINUED] methylPREDNISolone (MEDROL DOSEPAK) 4 MG TBPK tablet See admin instructions. follow package directions   No facility-administered encounter medications on file as of 12/28/2020.    Allergies (verified) Patient has no known allergies.   History: Past  Medical History:  Diagnosis Date   Adenocarcinoma of prostate ([place])    BPH with obstruction/lower urinary tract symptoms    HLD (hyperlipidemia)    Paget disease of bone    Past Surgical History:  Procedure Laterality Date   CHOLECYSTECTOMY     PROSTATE BIOPSY     PROSTATE SURGERY     Family History  Problem Relation Age of Onset   Cancer Son        melanoma   Breast cancer Sister    Cancer Brother    Kidney disease Neg Hx    Prostate cancer Neg Hx    Social History   Socioeconomic History   Marital status: Divorced    Spouse name: Not on file   Number of children: Not on file   Years of education: Not on file   Highest education level: Not on file  Occupational History   Not on file  Tobacco Use   Smoking status: Former   Smokeless tobacco: Never   Tobacco comments:    quit 50 years ago  Substance and Sexual Activity   Alcohol use: No    Alcohol/week: 0.0 standard drinks   Drug use: No   Sexual activity: Not on file  Other Topics Concern   Not on file  Social History Narrative   Not on file   Social Determinants of Health   Financial Resource Strain: Low Risk    Difficulty of Paying Living Expenses: Not hard at all  Food Insecurity: No Food Insecurity   Worried About Charity fundraiser in the  Last Year: Never true   Ran Out of Food in the Last Year: Never true  Transportation Needs: No Transportation Needs   Lack of Transportation (Medical): No   Lack of Transportation (Non-Medical): No  Physical Activity: Insufficiently Active   Days of Exercise per Week: 2 days   Minutes of Exercise per Session: 30 min  Stress: No Stress Concern Present   Feeling of Stress : Not at all  Social Connections: Moderately Isolated   Frequency of Communication with Friends and Family: More than three times a week   Frequency of Social Gatherings with Friends and Family: More than three times a week   Attends Religious Services: More than 4 times per year   Active Member  of [employer] or Organizations: No   Attends Archivist Meetings: Never   Marital Status: Widowed    Tobacco Counseling Counseling given: Not Answered Tobacco comments: quit 50 years ago   Clinical Intake:  Pre-visit preparation completed: Yes  Pain : No/denies pain     Nutritional Risks: None     Diabetic?no         Activities of Daily Living In your present state of health, do you have any difficulty performing the following activities: 12/28/2020  Hearing? Y  Vision? N  Difficulty concentrating or making decisions? N  Walking or climbing stairs? Y  Dressing or bathing? N  Doing errands, shopping? N  Preparing Food and eating ? N  Using the Toilet? N  In the past six months, have you accidently leaked urine? N  Do you have problems with loss of bowel control? N  Managing your Medications? N  Managing your Finances? N  Housekeeping or managing your Housekeeping? N  Some recent data might be hidden    Patient Care Team: Cletis Athens, MD as PCP - General (Internal Medicine)  Indicate any recent Medical Services you may have received from other than [facility] providers in the past year (date may be approximate).     Assessment:   This is a routine wellness examination for [name].  Hearing/Vision screen No results found.  Dietary issues and exercise activities discussed: Exercise limited by: orthopedic condition(s)   Goals Addressed   None    Depression Screen PHQ 2/9 Scores 12/28/2020 06/28/2020 12/28/2019  PHQ - 2 Score 0 0 0    Fall Risk Fall Risk  12/28/2020 06/28/2020 12/28/2019  Falls in the past year? 0 0 -  Number falls in past yr: 0 0 0  Injury with Fall? 0 0 0  Risk for fall due to : No Fall Risks No Fall Risks -  Follow up Falls evaluation completed - -    FALL RISK PREVENTION PERTAINING TO THE HOME:  Any stairs in or around the home? No  If so, are there any without handrails? No  Home free of loose throw rugs in walkways, pet  beds, electrical cords, etc? No  Adequate lighting in your home to reduce risk of falls? No   ASSISTIVE DEVICES UTILIZED TO PREVENT FALLS:  Life alert? No  Use of a cane, walker or w/c? No  Grab bars in the bathroom? No  Shower chair or bench in shower? No  Elevated toilet seat or a handicapped toilet? No   TIMED UP AND GO:  Was the test performed? No .    Cognitive Function: MMSE - Mini Mental State Exam 12/28/2020  Not completed: Unable to complete     6CIT Screen 12/28/2020  What Year? 0 points  What month? 0 points  What time? 0 points  Count back from 20 0 points  Months in reverse 0 points  Repeat phrase 0 points  Total Score 0    Immunizations Immunization History  Administered Date(s) Administered   Fluad Quad(high Dose 65+) 10/26/2019, 09/26/2020   PFIZER(Purple Top)SARS-COV-2 Vaccination 02/08/2019, 03/01/2019, 10/15/2019    TDAP status: Due, Education has been provided regarding the importance of this vaccine. Advised may receive this vaccine at local pharmacy or Health Dept. Aware to provide a copy of the vaccination record if obtained from local pharmacy or Health Dept. Verbalized acceptance and understanding.  Flu Vaccine status: Up to date  Pneumococcal vaccine status: Due, Education has been provided regarding the importance of this vaccine. Advised may receive this vaccine at local pharmacy or Health Dept. Aware to provide a copy of the vaccination record if obtained from local pharmacy or Health Dept. Verbalized acceptance and understanding.  Covid-19 vaccine status: Declined, Education has been provided regarding the importance of this vaccine but patient still declined. Advised may receive this vaccine at local pharmacy or Health Dept.or vaccine clinic. Aware to provide a copy of the vaccination record if obtained from local pharmacy or Health Dept. Verbalized acceptance and understanding.  Qualifies for Shingles Vaccine? Yes  Zostavax completed No    Shingrix Completed?: No.    Education has been provided regarding the importance of this vaccine. Patient has been advised to call insurance company to determine out of pocket expense if they have not yet received this vaccine. Advised may also receive vaccine at local pharmacy or Health Dept. Verbalized acceptance and understanding.  Screening Tests Health Maintenance  Topic Date Due   Pneumonia Vaccine 64+ Years old (1 - PCV) Never done   TETANUS/TDAP  Never done   Zoster Vaccines- Shingrix (1 of 2) Never done   COVID-19 Vaccine (4 - Booster for [employer] series) 12/10/2019   INFLUENZA VACCINE  Completed   HPV VACCINES  Aged Out    Health Maintenance  Health Maintenance Due  Topic Date Due   Pneumonia Vaccine 65+ Years old (1 - PCV) Never done   TETANUS/TDAP  Never done   Zoster Vaccines- Shingrix (1 of 2) Never done   COVID-19 Vaccine (4 - Booster for Pfizer series) 12/10/2019    Colorectal cancer screening: No longer required.   Lung Cancer Screening: (Low Dose CT Chest recommended if Age 46-80 years, 30 pack-year currently smoking OR have quit w/in 15years.) does not qualify.   Lung Cancer Screening Referral: na  Additional Screening:  Hepatitis C Screening: does not qualify   Vision Screening: Recommended annual ophthalmology exams for early detection of glaucoma and other disorders of the eye. Is the patient up to date with their annual eye exam?  Yes  Who is the provider or what is the name of the office in which the patient attends annual eye exams? [facility] If pt is not established with a provider, would they like to be referred to a provider to establish care? No .  Dental Screening: Recommended annual dental exams for proper oral hygiene  Community Resource Referral / Chronic Care Management: CRR required this visit?  No   CCM required this visit?  No      Plan:     I have personally reviewed and noted the following in the patients chart:   Medical and  social history Use of alcohol, tobacco or illicit drugs  Current medications and supplements including opioid prescriptions. Patient is not currently  taking opioid prescriptions. Functional ability and status Nutritional status Physical activity Advanced directives List of other physicians Hospitalizations, surgeries, and ER visits in previous 12 months Vitals Screenings to include cognitive, depression, and falls Referrals and appointments  In addition, I have reviewed and discussed with patient certain preventive protocols, quality metrics, and best practice recommendations. A written personalized care plan for preventive services as well as general preventive health recommendations were provided to patient.     Lacretia Nicks, [place]   12/28/2020   Nurse Notes: Mr. Semper , Thank you for taking time to come for your Medicare Wellness Visit. I appreciate your ongoing commitment to your health goals. Please review the following plan we discussed and let me know if I can assist you in the future.   These are the goals we discussed:  Goals   None     This is a list of the screening recommended for you and due dates:  Health Maintenance  Topic Date Due   Pneumonia Vaccine (1 - PCV) Never done   Tetanus Vaccine  Never done   Zoster (Shingles) Vaccine (1 of 2) Never done   COVID-19 Vaccine (4 - Booster for Pfizer series) 12/10/2019   Flu Shot  Completed   HPV Vaccine  Aged Out       Time spent 35 min

## 2020-12-29 NOTE — Progress Notes (Signed)
I have reviewed this visit and agree with the documentation.   

## 2021-01-01 ENCOUNTER — Encounter: Payer: Medicare (Managed Care) | Admitting: Internal Medicine

## 2021-01-29 ENCOUNTER — Ambulatory Visit: Payer: Medicare (Managed Care) | Admitting: Internal Medicine

## 2021-01-30 ENCOUNTER — Other Ambulatory Visit: Payer: Self-pay

## 2021-01-30 ENCOUNTER — Ambulatory Visit (INDEPENDENT_AMBULATORY_CARE_PROVIDER_SITE_OTHER): Payer: BLUE CROSS/BLUE SHIELD | Admitting: Internal Medicine

## 2021-01-30 ENCOUNTER — Encounter: Payer: Self-pay | Admitting: Internal Medicine

## 2021-01-30 VITALS — BP 94/71 | HR 61 | Ht 67.0 in | Wt 152.3 lb

## 2021-01-30 DIAGNOSIS — I950 Idiopathic hypotension: Secondary | ICD-10-CM | POA: Diagnosis not present

## 2021-01-30 DIAGNOSIS — M889 Osteitis deformans of unspecified bone: Secondary | ICD-10-CM

## 2021-01-30 DIAGNOSIS — I10 Essential (primary) hypertension: Secondary | ICD-10-CM | POA: Diagnosis not present

## 2021-01-30 DIAGNOSIS — M25559 Pain in unspecified hip: Secondary | ICD-10-CM

## 2021-01-30 DIAGNOSIS — C61 Malignant neoplasm of prostate: Secondary | ICD-10-CM | POA: Diagnosis not present

## 2021-01-30 NOTE — Assessment & Plan Note (Signed)
Patient blood pressure was found to be normal.  Examination, we will do a complete blood test and cardiopulmonary, he does not have any significant hypertension

## 2021-01-30 NOTE — Addendum Note (Signed)
Addended by: Lacretia Nicks L on: 01/30/2021 12:15 PM   Modules accepted: Orders

## 2021-01-30 NOTE — Assessment & Plan Note (Signed)
Ref to [facility]

## 2021-01-30 NOTE — Progress Notes (Signed)
Established Patient Office Visit  Subjective:  Patient ID: Dean White, male    DOB: 1928/08/27  Age: 88 y.o. MRN: [id number]  CC:  Chief Complaint  Patient presents with   Hip Pain    Right hip pain    Hip Pain    [name] presents for general check/ pt has low bp  Past Medical History:  Diagnosis Date   Adenocarcinoma of prostate ([place])    BPH with obstruction/lower urinary tract symptoms    HLD (hyperlipidemia)    Paget disease of bone     Past Surgical History:  Procedure Laterality Date   CHOLECYSTECTOMY     PROSTATE BIOPSY     PROSTATE SURGERY      Family History  Problem Relation Age of Onset   Cancer Son        melanoma   Breast cancer Sister    Cancer Brother    Kidney disease Neg Hx    Prostate cancer Neg Hx     Social History   Socioeconomic History   Marital status: Divorced    Spouse name: Not on file   Number of children: Not on file   Years of education: Not on file   Highest education level: Not on file  Occupational History   Not on file  Tobacco Use   Smoking status: Former   Smokeless tobacco: Never   Tobacco comments:    quit 50 years ago  Substance and Sexual Activity   Alcohol use: No    Alcohol/week: 0.0 standard drinks   Drug use: No   Sexual activity: Not on file  Other Topics Concern   Not on file  Social History Narrative   Not on file   Social Determinants of Health   Financial Resource Strain: Low Risk    Difficulty of Paying Living Expenses: Not hard at all  Food Insecurity: No Food Insecurity   Worried About Charity fundraiser in the Last Year: Never true   Ran Out of Food in the Last Year: Never true  Transportation Needs: No Transportation Needs   Lack of Transportation (Medical): No   Lack of Transportation (Non-Medical): No  Physical Activity: Insufficiently Active   Days of Exercise per Week: 2 days   Minutes of Exercise per Session: 30 min  Stress: No Stress Concern Present   Feeling  of Stress : Not at all  Social Connections: Moderately Isolated   Frequency of Communication with Friends and Family: More than three times a week   Frequency of Social Gatherings with Friends and Family: More than three times a week   Attends Religious Services: More than 4 times per year   Active Member of [employer] or Organizations: No   Attends Archivist Meetings: Never   Marital Status: Widowed  Human resources officer Violence: Not At Risk   Fear of Current or Ex-Partner: No   Emotionally Abused: No   Physically Abused: No   Sexually Abused: No     Current Outpatient Medications:    cholecalciferol (VITAMIN D) 1000 UNITS tablet, Take 1,000 Units by mouth daily., Disp: , Rfl:    latanoprost (XALATAN) 0.005 % ophthalmic solution, place 1 drop into both eyes at bedtime, Disp: , Rfl: 0   Leuprolide Acetate, 6 Month, (LUPRON) 45 MG injection, Inject 45 mg into the muscle every 6 (six) months., Disp: , Rfl:    lovastatin (MEVACOR) 40 MG tablet, TAKE 1 TABLET BY MOUTH DAILY, Disp: 90 tablet, Rfl:  3   Misc Natural Products (PROSTATE HEALTH PO), Take by mouth., Disp: , Rfl:    Multiple Vitamin (MULTIVITAMIN) tablet, Take 1 tablet by mouth daily., Disp: , Rfl:    No Known Allergies  ROS Review of Systems  Constitutional: Negative.   HENT: Negative.    Eyes: Negative.   Respiratory: Negative.    Cardiovascular: Negative.   Gastrointestinal: Negative.   Endocrine: Negative.   Genitourinary: Negative.   Musculoskeletal: Negative.        Rt hip pain  lt knee pain  Skin: Negative.   Allergic/Immunologic: Negative.   Neurological: Negative.   Hematological: Negative.   Psychiatric/Behavioral: Negative.    All other systems reviewed and are negative.    Objective:    Physical Exam Vitals reviewed.  Constitutional:      Appearance: Normal appearance.  HENT:     Mouth/Throat:     Mouth: Mucous membranes are moist.  Eyes:     Pupils: Pupils are equal, round, and reactive to  light.  Neck:     Vascular: No carotid bruit.  Cardiovascular:     Rate and Rhythm: Normal rate and regular rhythm.     Pulses: Normal pulses.     Heart sounds: Normal heart sounds.  Pulmonary:     Effort: Pulmonary effort is normal.     Breath sounds: Normal breath sounds.  Abdominal:     General: Bowel sounds are normal.     Palpations: Abdomen is soft. There is no hepatomegaly, splenomegaly or mass.     Tenderness: There is no abdominal tenderness.     Hernia: No hernia is present.  Musculoskeletal:     Cervical back: Neck supple.     Right lower leg: No edema.     Left lower leg: No edema.  Skin:    Findings: No rash.  Neurological:     Mental Status: He is alert and oriented to person, place, and time.     Motor: No weakness.  Psychiatric:        Mood and Affect: Mood normal.        Behavior: Behavior normal.    BP 94/71    Pulse 61    Ht 5\' 7"  (1.702 m)    Wt 152 lb 4.8 oz (69.1 kg)    BMI 23.85 kg/m  Wt Readings from Last 3 Encounters:  01/30/21 152 lb 4.8 oz (69.1 kg)  09/26/20 148 lb 11.2 oz (67.4 kg)  06/28/20 153 lb 11.2 oz (69.7 kg)     Health Maintenance Due  Topic Date Due   Pneumonia Vaccine 5+ Years old (1 - PCV) Never done   TETANUS/TDAP  Never done   Zoster Vaccines- Shingrix (1 of 2) Never done   COVID-19 Vaccine (4 - Booster for Pfizer series) 12/10/2019    There are no preventive care reminders to display for this patient.  Lab Results  Component Value Date   TSH 2.60 03/28/2020   Lab Results  Component Value Date   WBC 5.6 03/28/2020   HGB 14.8 03/28/2020   HCT 46.1 03/28/2020   MCV 92.6 03/28/2020   PLT 252 03/28/2020   Lab Results  Component Value Date   NA 143 03/28/2020   K 4.1 03/28/2020   CO2 24 03/28/2020   GLUCOSE 102 (H) 03/28/2020   BUN 17 03/28/2020   CREATININE 0.90 03/28/2020   BILITOT 0.4 03/28/2020   ALKPHOS 73 06/20/2017   AST 17 03/28/2020   ALT 16 03/28/2020  PROT 6.3 03/28/2020   ALBUMIN 4.1 06/20/2017    CALCIUM 9.3 03/28/2020   ANIONGAP 8 06/20/2017   Lab Results  Component Value Date   CHOL 146 03/28/2020   Lab Results  Component Value Date   HDL 60 03/28/2020   Lab Results  Component Value Date   LDLCALC 65 03/28/2020   Lab Results  Component Value Date   TRIG 124 03/28/2020   Lab Results  Component Value Date   CHOLHDL 2.4 03/28/2020   No results found for: HGBA1C    Assessment & Plan:   Problem List Items Addressed This Visit       Cardiovascular and Mediastinum   Essential hypertension    Patient blood pressure was found to be normal.  Examination, we will do a complete blood test and cardiopulmonary, he does not have any significant hypertension      Idiopathic hypotension    Cbc metb , cortisol level .  no dissiness        Genitourinary   Malignant neoplasm of prostate ([place])    Ref to gu        Other   Hip pain - Primary    Ref to ortho       No orders of the defined types were placed in this encounter.   Follow-up: No follow-ups on file.    Cletis Athens, MD

## 2021-01-30 NOTE — Assessment & Plan Note (Signed)
Ref to ortho

## 2021-01-30 NOTE — Assessment & Plan Note (Signed)
Cbc metb , cortisol level .  no dissiness

## 2021-01-31 LAB — CBC WITH DIFFERENTIAL/PLATELET
Absolute Monocytes: 403 cells/uL (ref 200–950)
Basophils Absolute: 72 cells/uL (ref 0–200)
Basophils Relative: 1.5 %
Eosinophils Absolute: 202 cells/uL (ref 15–500)
Eosinophils Relative: 4.2 %
HCT: 43.3 % (ref 38.5–50.0)
Hemoglobin: 14.1 g/dL (ref 13.2–17.1)
Lymphs Abs: 1752 cells/uL (ref 850–3900)
MCH: 30.6 pg (ref 27.0–33.0)
MCHC: 32.6 g/dL (ref 32.0–36.0)
MCV: 93.9 fL (ref 80.0–100.0)
MPV: 11.9 fL (ref 7.5–12.5)
Monocytes Relative: 8.4 %
Neutro Abs: 2371 cells/uL (ref 1500–7800)
Neutrophils Relative %: 49.4 %
Platelets: 228 10*3/uL (ref 140–400)
RBC: 4.61 10*6/uL (ref 4.20–5.80)
RDW: 12.5 % (ref 11.0–15.0)
Total Lymphocyte: 36.5 %
WBC: 4.8 10*3/uL (ref 3.8–10.8)

## 2021-01-31 LAB — COMPLETE METABOLIC PANEL WITH GFR
AG Ratio: 1.8 (calc) (ref 1.0–2.5)
ALT: 10 U/L (ref 9–46)
AST: 18 U/L (ref 10–35)
Albumin: 4.2 g/dL (ref 3.6–5.1)
Alkaline phosphatase (APISO): 63 U/L (ref 35–144)
BUN: 15 mg/dL (ref 7–25)
CO2: 29 mmol/L (ref 20–32)
Calcium: 9.8 mg/dL (ref 8.6–10.3)
Chloride: 106 mmol/L (ref 98–110)
Creat: 1.01 mg/dL (ref 0.70–1.22)
Globulin: 2.4 g/dL (calc) (ref 1.9–3.7)
Glucose, Bld: 96 mg/dL (ref 65–99)
Potassium: 4.1 mmol/L (ref 3.5–5.3)
Sodium: 141 mmol/L (ref 135–146)
Total Bilirubin: 0.4 mg/dL (ref 0.2–1.2)
Total Protein: 6.6 g/dL (ref 6.1–8.1)
eGFR: 71 mL/min/{1.73_m2} (ref >=60)

## 2021-01-31 LAB — CORTISOL: Cortisol, Plasma: 11.4 ug/dL

## 2021-02-13 ENCOUNTER — Encounter: Payer: Self-pay | Admitting: Internal Medicine

## 2021-02-13 ENCOUNTER — Ambulatory Visit (INDEPENDENT_AMBULATORY_CARE_PROVIDER_SITE_OTHER): Payer: BLUE CROSS/BLUE SHIELD | Admitting: Internal Medicine

## 2021-02-13 ENCOUNTER — Other Ambulatory Visit: Payer: Self-pay

## 2021-02-13 VITALS — BP 106/72 | HR 58 | Ht 67.0 in | Wt 152.4 lb

## 2021-02-13 DIAGNOSIS — M889 Osteitis deformans of unspecified bone: Secondary | ICD-10-CM | POA: Diagnosis not present

## 2021-02-13 DIAGNOSIS — N401 Enlarged prostate with lower urinary tract symptoms: Secondary | ICD-10-CM

## 2021-02-13 DIAGNOSIS — I10 Essential (primary) hypertension: Secondary | ICD-10-CM | POA: Diagnosis not present

## 2021-02-13 DIAGNOSIS — N138 Other obstructive and reflux uropathy: Secondary | ICD-10-CM

## 2021-02-13 DIAGNOSIS — M25559 Pain in unspecified hip: Secondary | ICD-10-CM

## 2021-02-13 NOTE — Progress Notes (Signed)
Established Patient Office Visit  Subjective:  Patient ID: Dean White, male    DOB: 22-Apr-1928  Age: 88 y.o. MRN: [id number]  CC:  Chief Complaint  Patient presents with   lab results    HPI  Dean White presents for lt knee pain  Past Medical History:  Diagnosis Date   Adenocarcinoma of prostate ([place])    BPH with obstruction/lower urinary tract symptoms    HLD (hyperlipidemia)    Paget disease of bone     Past Surgical History:  Procedure Laterality Date   CHOLECYSTECTOMY     PROSTATE BIOPSY     PROSTATE SURGERY      Family History  Problem Relation Age of Onset   Cancer Son        melanoma   Breast cancer Sister    Cancer Brother    Kidney disease Neg Hx    Prostate cancer Neg Hx     Social History   Socioeconomic History   Marital status: Divorced    Spouse name: Not on file   Number of children: Not on file   Years of education: Not on file   Highest education level: Not on file  Occupational History   Not on file  Tobacco Use   Smoking status: Former   Smokeless tobacco: Never   Tobacco comments:    quit 50 years ago  Substance and Sexual Activity   Alcohol use: No    Alcohol/week: 0.0 standard drinks   Drug use: No   Sexual activity: Not on file  Other Topics Concern   Not on file  Social History Narrative   Not on file   Social Determinants of Health   Financial Resource Strain: Low Risk    Difficulty of Paying Living Expenses: Not hard at all  Food Insecurity: No Food Insecurity   Worried About Charity fundraiser in the Last Year: Never true   Ran Out of Food in the Last Year: Never true  Transportation Needs: No Transportation Needs   Lack of Transportation (Medical): No   Lack of Transportation (Non-Medical): No  Physical Activity: Insufficiently Active   Days of Exercise per Week: 2 days   Minutes of Exercise per Session: 30 min  Stress: No Stress Concern Present   Feeling of Stress : Not at all  Social  Connections: Moderately Isolated   Frequency of Communication with Friends and Family: More than three times a week   Frequency of Social Gatherings with Friends and Family: More than three times a week   Attends Religious Services: More than 4 times per year   Active Member of [employer] or Organizations: No   Attends Archivist Meetings: Never   Marital Status: Widowed  Human resources officer Violence: Not At Risk   Fear of Current or Ex-Partner: No   Emotionally Abused: No   Physically Abused: No   Sexually Abused: No     Current Outpatient Medications:    cholecalciferol (VITAMIN D) 1000 UNITS tablet, Take 1,000 Units by mouth daily., Disp: , Rfl:    latanoprost (XALATAN) 0.005 % ophthalmic solution, place 1 drop into both eyes at bedtime, Disp: , Rfl: 0   Leuprolide Acetate, 6 Month, (LUPRON) 45 MG injection, Inject 45 mg into the muscle every 6 (six) months., Disp: , Rfl:    lovastatin (MEVACOR) 40 MG tablet, TAKE 1 TABLET BY MOUTH DAILY, Disp: 90 tablet, Rfl: 3   Misc Natural Products (PROSTATE HEALTH PO), Take by mouth.,  Disp: , Rfl:    Multiple Vitamin (MULTIVITAMIN) tablet, Take 1 tablet by mouth daily., Disp: , Rfl:    No Known Allergies  ROS Review of Systems  Constitutional: Negative.  Negative for chills and diaphoresis.  HENT: Negative.    Eyes: Negative.  Negative for redness.  Respiratory: Negative.  Negative for cough.   Cardiovascular: Negative.  Negative for leg swelling.  Gastrointestinal: Negative.   Endocrine: Negative.   Genitourinary: Negative.   Musculoskeletal: Negative.   Skin: Negative.   Allergic/Immunologic: Negative.   Neurological: Negative.   Hematological: Negative.   Psychiatric/Behavioral: Negative.    All other systems reviewed and are negative.    Objective:    Physical Exam Vitals reviewed.  Constitutional:      Appearance: Normal appearance.  HENT:     Mouth/Throat:     Mouth: Mucous membranes are moist.  Eyes:     Pupils:  Pupils are equal, round, and reactive to light.  Neck:     Vascular: No carotid bruit.  Cardiovascular:     Rate and Rhythm: Normal rate and regular rhythm.     Pulses: Normal pulses.     Heart sounds: Normal heart sounds. No murmur heard. Pulmonary:     Effort: Pulmonary effort is normal. No respiratory distress.     Breath sounds: Normal breath sounds.  Abdominal:     General: Bowel sounds are normal.     Palpations: Abdomen is soft. There is no hepatomegaly, splenomegaly or mass.     Tenderness: There is no abdominal tenderness.     Hernia: No hernia is present.  Musculoskeletal:     Cervical back: Neck supple.     Right lower leg: No edema.     Left lower leg: No edema.     Comments: Lt knee crepitus  Skin:    General: Skin is warm.     Findings: No rash.  Neurological:     General: No focal deficit present.     Mental Status: He is alert and oriented to person, place, and time.     Motor: No weakness.  Psychiatric:        Mood and Affect: Mood normal.        Behavior: Behavior normal.    BP 106/72    Pulse (!) 58    Ht '5\' 7"'  (1.702 m)    Wt 152 lb 6.4 oz (69.1 kg)    BMI 23.87 kg/m  Wt Readings from Last 3 Encounters:  02/13/21 152 lb 6.4 oz (69.1 kg)  01/30/21 152 lb 4.8 oz (69.1 kg)  09/26/20 148 lb 11.2 oz (67.4 kg)     Health Maintenance Due  Topic Date Due   Pneumonia Vaccine 51+ Years old (1 - PCV) Never done   TETANUS/TDAP  Never done   Zoster Vaccines- Shingrix (1 of 2) Never done   COVID-19 Vaccine (4 - Booster for Pfizer series) 12/10/2019    There are no preventive care reminders to display for this patient.  Lab Results  Component Value Date   TSH 2.60 03/28/2020   Lab Results  Component Value Date   WBC 4.8 01/30/2021   HGB 14.1 01/30/2021   HCT 43.3 01/30/2021   MCV 93.9 01/30/2021   PLT 228 01/30/2021   Lab Results  Component Value Date   NA 141 01/30/2021   K 4.1 01/30/2021   CO2 29 01/30/2021   GLUCOSE 96 01/30/2021   BUN 15  01/30/2021   CREATININE 1.01 01/30/2021  BILITOT 0.4 01/30/2021   ALKPHOS 73 06/20/2017   AST 18 01/30/2021   ALT 10 01/30/2021   PROT 6.6 01/30/2021   ALBUMIN 4.1 06/20/2017   CALCIUM 9.8 01/30/2021   ANIONGAP 8 06/20/2017   EGFR 71 01/30/2021   Lab Results  Component Value Date   CHOL 146 03/28/2020   Lab Results  Component Value Date   HDL 60 03/28/2020   Lab Results  Component Value Date   LDLCALC 65 03/28/2020   Lab Results  Component Value Date   TRIG 124 03/28/2020   Lab Results  Component Value Date   CHOLHDL 2.4 03/28/2020   No results found for: HGBA1C    Assessment & Plan:   Problem List Items Addressed This Visit       Cardiovascular and Mediastinum   Essential hypertension - Primary     Patient denies any chest pain or shortness of breath there is no history of palpitation or paroxysmal nocturnal dyspnea   patient was advised to follow low-salt low-cholesterol diet    ideally I want to keep systolic blood pressure below 130 mmHg, patient was asked to check blood pressure one times a week and give me a report on that.  Patient will be follow-up in 3 months  or earlier as needed, patient will call me back for any change in the cardiovascular symptoms Patient was advised to buy a book from local bookstore concerning blood pressure and read several chapters  every day.  This will be supplemented by some of the material we will give him from the office.  Patient should also utilize other resources like YouTube and Internet to learn more about the blood pressure and the diet.        Musculoskeletal and Integument   Paget's bone disease    Stable        Genitourinary   BPH with obstruction/lower urinary tract symptoms    Stable at the present time        Other   Hip pain    Patient did not complain of any hip pain today       No orders of the defined types were placed in this encounter.   Follow-up: No follow-ups on file.    Cletis Athens, MD

## 2021-02-13 NOTE — Assessment & Plan Note (Signed)

## 2021-02-13 NOTE — Assessment & Plan Note (Signed)
Stable at the present time. 

## 2021-02-13 NOTE — Assessment & Plan Note (Signed)
Stable

## 2021-02-13 NOTE — Assessment & Plan Note (Signed)
Patient did not complain of any hip pain today

## 2021-03-27 ENCOUNTER — Other Ambulatory Visit: Payer: Self-pay

## 2021-03-27 ENCOUNTER — Encounter: Payer: Self-pay | Admitting: Internal Medicine

## 2021-03-27 ENCOUNTER — Ambulatory Visit (INDEPENDENT_AMBULATORY_CARE_PROVIDER_SITE_OTHER): Payer: Medicare Other | Admitting: Internal Medicine

## 2021-03-27 VITALS — BP 108/68 | HR 73 | Ht 67.0 in | Wt 152.4 lb

## 2021-03-27 DIAGNOSIS — I10 Essential (primary) hypertension: Secondary | ICD-10-CM | POA: Diagnosis not present

## 2021-03-27 DIAGNOSIS — M889 Osteitis deformans of unspecified bone: Secondary | ICD-10-CM

## 2021-03-27 DIAGNOSIS — N138 Other obstructive and reflux uropathy: Secondary | ICD-10-CM

## 2021-03-27 DIAGNOSIS — M25559 Pain in unspecified hip: Secondary | ICD-10-CM | POA: Diagnosis not present

## 2021-03-27 DIAGNOSIS — N401 Enlarged prostate with lower urinary tract symptoms: Secondary | ICD-10-CM

## 2021-03-27 NOTE — Assessment & Plan Note (Signed)
Stable at the present time. 

## 2021-03-27 NOTE — Assessment & Plan Note (Signed)
Due to osteoarthritis ?

## 2021-03-27 NOTE — Assessment & Plan Note (Signed)
Stable

## 2021-03-27 NOTE — Progress Notes (Signed)
? ?Established Patient Office Visit ? ?Subjective:  ?Patient ID: Dean White, male    DOB: 08/11/1928  Age: 88 y.o. MRN: [id number] ? ?CC:  ?Chief Complaint  ?Patient presents with  ? Follow-up  ? ? ?HPI ? ?[name] presents for general check ?Past Medical History:  ?Diagnosis Date  ? Adenocarcinoma of prostate ([place])   ? BPH with obstruction/lower urinary tract symptoms   ? HLD (hyperlipidemia)   ? Paget disease of bone   ? ? ?Past Surgical History:  ?Procedure Laterality Date  ? CHOLECYSTECTOMY    ? PROSTATE BIOPSY    ? PROSTATE SURGERY    ? ? ?Family History  ?Problem Relation Age of Onset  ? Cancer Son   ?     melanoma  ? Breast cancer Sister   ? Cancer Brother   ? Kidney disease Neg Hx   ? Prostate cancer Neg Hx   ? ? ?Social History  ? ?Socioeconomic History  ? Marital status: Divorced  ?  Spouse name: Not on file  ? Number of children: Not on file  ? Years of education: Not on file  ? Highest education level: Not on file  ?Occupational History  ? Not on file  ?Tobacco Use  ? Smoking status: Former  ? Smokeless tobacco: Never  ? Tobacco comments:  ?  quit 50 years ago  ?Substance and Sexual Activity  ? Alcohol use: No  ?  Alcohol/week: 0.0 standard drinks  ? Drug use: No  ? Sexual activity: Not on file  ?Other Topics Concern  ? Not on file  ?Social History Narrative  ? Not on file  ? ?Social Determinants of Health  ? ?Financial Resource Strain: Low Risk   ? Difficulty of Paying Living Expenses: Not hard at all  ?Food Insecurity: No Food Insecurity  ? Worried About Charity fundraiser in the Last Year: Never true  ? Ran Out of Food in the Last Year: Never true  ?Transportation Needs: No Transportation Needs  ? Lack of Transportation (Medical): No  ? Lack of Transportation (Non-Medical): No  ?Physical Activity: Insufficiently Active  ? Days of Exercise per Week: 2 days  ? Minutes of Exercise per Session: 30 min  ?Stress: No Stress Concern Present  ? Feeling of Stress : Not at all  ?Social Connections:  Moderately Isolated  ? Frequency of Communication with Friends and Family: More than three times a week  ? Frequency of Social Gatherings with Friends and Family: More than three times a week  ? Attends Religious Services: More than 4 times per year  ? Active Member of Clubs or Organizations: No  ? Attends Archivist Meetings: Never  ? Marital Status: Widowed  ?Intimate Partner Violence: Not At Risk  ? Fear of Current or Ex-Partner: No  ? Emotionally Abused: No  ? Physically Abused: No  ? Sexually Abused: No  ? ? ? ?Current Outpatient Medications:  ?  cholecalciferol (VITAMIN D) 1000 UNITS tablet, Take 1,000 Units by mouth daily., Disp: , Rfl:  ?  latanoprost (XALATAN) 0.005 % ophthalmic solution, place 1 drop into both eyes at bedtime, Disp: , Rfl: 0 ?  Leuprolide Acetate, 6 Month, (LUPRON) 45 MG injection, Inject 45 mg into the muscle every 6 (six) months., Disp: , Rfl:  ?  lovastatin (MEVACOR) 40 MG tablet, TAKE 1 TABLET BY MOUTH DAILY, Disp: 90 tablet, Rfl: 3 ?  Misc Natural Products (PROSTATE HEALTH PO), Take by mouth., Disp: , Rfl:  ?  Multiple Vitamin (MULTIVITAMIN) tablet, Take 1 tablet by mouth daily., Disp: , Rfl:   ? ?No Known Allergies ? ?ROS ?Review of Systems  ?Constitutional: Negative.   ?HENT: Negative.    ?Eyes: Negative.   ?Respiratory: Negative.    ?Cardiovascular: Negative.   ?Gastrointestinal: Negative.   ?Endocrine: Negative.   ?Genitourinary: Negative.   ?Musculoskeletal: Negative.   ?Skin: Negative.   ?Allergic/Immunologic: Negative.   ?Neurological: Negative.   ?Hematological: Negative.   ?Psychiatric/Behavioral: Negative.    ?All other systems reviewed and are negative. ? ?  ?Objective:  ?  ?Physical Exam ?Vitals reviewed.  ?Constitutional:   ?   Appearance: Normal appearance.  ?HENT:  ?   Mouth/Throat:  ?   Mouth: Mucous membranes are moist.  ?Eyes:  ?   Pupils: Pupils are equal, round, and reactive to light.  ?Neck:  ?   Vascular: No carotid bruit.  ?Cardiovascular:  ?   Rate  and Rhythm: Normal rate and regular rhythm.  ?   Pulses: Normal pulses.  ?   Heart sounds: Normal heart sounds.  ?Pulmonary:  ?   Effort: Pulmonary effort is normal.  ?   Breath sounds: Normal breath sounds.  ?Abdominal:  ?   General: Bowel sounds are normal.  ?   Palpations: Abdomen is soft. There is no hepatomegaly, splenomegaly or mass.  ?   Tenderness: There is no abdominal tenderness.  ?   Hernia: No hernia is present.  ?Musculoskeletal:  ?   Cervical back: Neck supple.  ?   Right lower leg: No edema.  ?   Left lower leg: No edema.  ?Skin: ?   Findings: No rash.  ?Neurological:  ?   Mental Status: He is alert and oriented to person, place, and time.  ?   Motor: No weakness.  ?Psychiatric:     ?   Mood and Affect: Mood normal.     ?   Behavior: Behavior normal.  ? ? ?BP 108/68   Pulse 73   Ht '5\' 7"'  (1.702 m)   Wt 152 lb 6.4 oz (69.1 kg)   BMI 23.87 kg/m?  ?Wt Readings from Last 3 Encounters:  ?03/27/21 152 lb 6.4 oz (69.1 kg)  ?02/13/21 152 lb 6.4 oz (69.1 kg)  ?01/30/21 152 lb 4.8 oz (69.1 kg)  ? ? ? ?Health Maintenance Due  ?Topic Date Due  ? TETANUS/TDAP  Never done  ? Zoster Vaccines- Shingrix (1 of 2) Never done  ? Pneumonia Vaccine 46+ Years old (1 - PCV) Never done  ? COVID-19 Vaccine (4 - Booster for Pfizer series) 12/10/2019  ? ? ?There are no preventive care reminders to display for this patient. ? ?Lab Results  ?Component Value Date  ? TSH 2.60 03/28/2020  ? ?Lab Results  ?Component Value Date  ? WBC 4.8 01/30/2021  ? HGB 14.1 01/30/2021  ? HCT 43.3 01/30/2021  ? MCV 93.9 01/30/2021  ? PLT 228 01/30/2021  ? ?Lab Results  ?Component Value Date  ? NA 141 01/30/2021  ? K 4.1 01/30/2021  ? CO2 29 01/30/2021  ? GLUCOSE 96 01/30/2021  ? BUN 15 01/30/2021  ? CREATININE 1.01 01/30/2021  ? BILITOT 0.4 01/30/2021  ? ALKPHOS 73 06/20/2017  ? AST 18 01/30/2021  ? ALT 10 01/30/2021  ? PROT 6.6 01/30/2021  ? ALBUMIN 4.1 06/20/2017  ? CALCIUM 9.8 01/30/2021  ? ANIONGAP 8 06/20/2017  ? EGFR 71 01/30/2021  ? ?Lab  Results  ?Component Value Date  ? CHOL 146  03/28/2020  ? ?Lab Results  ?Component Value Date  ? HDL 60 03/28/2020  ? ?Lab Results  ?Component Value Date  ? [place] 65 03/28/2020  ? ?Lab Results  ?Component Value Date  ? TRIG 124 03/28/2020  ? ?Lab Results  ?Component Value Date  ? CHOLHDL 2.4 03/28/2020  ? ?No results found for: HGBA1C ? ?  ?Assessment & Plan:  ? ?Problem List Items Addressed This Visit   ? ?  ? Cardiovascular and Mediastinum  ? Essential hypertension - Primary  ?  Stable at the present time ?  ?  ?  ? Musculoskeletal and Integument  ? Paget's bone disease  ?  Stable ?  ?  ?  ? Genitourinary  ? BPH with obstruction/lower urinary tract symptoms  ?  Stable ?  ?  ?  ? Other  ? Hip pain  ?  Due to osteoarthritis ?  ?  ? ? ?No orders of the defined types were placed in this encounter. ? ? ?Follow-up: No follow-ups on file.  ? ? ?Cletis Athens, MD ?

## 2021-05-03 ENCOUNTER — Ambulatory Visit (INDEPENDENT_AMBULATORY_CARE_PROVIDER_SITE_OTHER): Payer: Medicare Other | Admitting: Physician Assistant

## 2021-05-03 ENCOUNTER — Encounter: Payer: Self-pay | Admitting: Physician Assistant

## 2021-05-03 VITALS — BP 108/63 | HR 89 | Ht 70.0 in | Wt 152.0 lb

## 2021-05-03 DIAGNOSIS — C61 Malignant neoplasm of prostate: Secondary | ICD-10-CM

## 2021-05-03 MED ORDER — LEUPROLIDE ACETATE (6 MONTH) 45 MG ~~LOC~~ KIT
45.0000 mg | PACK | Freq: Once | SUBCUTANEOUS | Status: AC
  Administered 2021-05-03: 45 mg via SUBCUTANEOUS

## 2021-05-03 NOTE — Progress Notes (Signed)
? ?05/03/2021 ?11:02 AM  ? ?[name] ?08/04/1928 ?[id number] ? ?CC: ?Chief Complaint  ?Patient presents with  ? Prostate Cancer  ? ?HPI: ?Dean White is a 88 y.o. male with PMH metastatic prostate cancer on indefinite ADT who presents today for annual follow-up and 59-monthEligard injection.  ? ?Today he reports right shoulder, low back, and knee pain.  He wears a back brace and walks with a cane to help with these.  Timing unclear.  He also reports some night sweats over the past several months and is unsure if he is sleeping under too many blankets.  He continues to take his calcium and vitamin D supplements and reports he is tolerating his ADT well without concerns. ? ?PMH: ?Past Medical History:  ?Diagnosis Date  ? [place]   ? BPH with obstruction/lower urinary tract symptoms   ? HLD (hyperlipidemia)   ? Paget disease of bone   ? ? ?Surgical History: ?Past Surgical History:  ?Procedure Laterality Date  ? CHOLECYSTECTOMY    ? PROSTATE BIOPSY    ? PROSTATE SURGERY    ? ? ?Home Medications:  ?Allergies as of 05/03/2021   ?No Known Allergies ?  ? ?  ?Medication List  ?  ? ?  ? Accurate as of May 03, 2021 11:02 AM. If you have any questions, ask your nurse or doctor.  ?  ?  ? ?  ? ?cholecalciferol 1000 units tablet ?Commonly known as: VITAMIN D ?Take 1,000 Units by mouth daily. ?  ?latanoprost 0.005 % ophthalmic solution ?Commonly known as: XALATAN ?place 1 drop into both eyes at bedtime ?  ?Leuprolide Acetate (6 Month) 45 MG injection ?Commonly known as: LUPRON ?Inject 45 mg into the muscle every 6 (six) months. ?  ?lovastatin 40 MG tablet ?Commonly known as: MEVACOR ?TAKE 1 TABLET BY MOUTH DAILY ?  ?multivitamin tablet ?Take 1 tablet by mouth daily. ?  ?PROSTATE HEALTH PO ?Take by mouth. ?  ? ?  ? ? ?Allergies:  ?No Known Allergies ? ?Family History: ?Family History  ?Problem Relation Age of Onset  ? Cancer Son   ?     melanoma  ? Breast cancer Sister   ? Cancer Brother   ?  Kidney disease Neg Hx   ? Prostate cancer Neg Hx   ? ? ?Social History:  ? reports that he has quit smoking. He has never used smokeless tobacco. He reports that he does not drink alcohol and does not use drugs. ? ?Physical Exam: ?BP 108/63   Pulse 89   Ht '5\' 10"'$  (1.778 m)   Wt 152 lb (68.9 kg)   BMI 21.81 kg/m?   ?Constitutional:  Alert and oriented, no acute distress, nontoxic appearing ?HEENT: [place], AT ?Cardiovascular: No clubbing, cyanosis, or edema ?Respiratory: Normal respiratory effort, no increased work of breathing ?Skin: No rashes, bruises or suspicious lesions ?Neurologic: Grossly intact, moving all 4 extremities ?Psychiatric: Normal mood and affect ? ?Assessment & Plan:   ?1. [place] cancer ([place] ?Due for Eligard, administered today in clinic.  He is overdue for every 3 month PSA, will check this today. ? ?He does report some new body aches/pains, however unclear if these are related to his underlying malignancy versus age-related.  Will defer repeat imaging unless his PSA is rising.  Patient is in agreement with this plan. ?- leuprolide (6 Month) (ELIGARD) injection 45 mg ?- PSA ? ?Return in about 3 months (around 08/02/2021) for Lab visit for PSA +  6 months Eligard. ? ?Debroah Loop, PA-C ? ?[facility] ?[address], [address] ?[place], [place] [address] ?[phone] ?   ?

## 2021-05-03 NOTE — Progress Notes (Signed)
Eligard SubQ Injection  ?  ?Due to Prostate Cancer patient is present today for a Eligard Injection. ?  ?Medication: Eligard 6 month ?Dose: 45 mg  ?Location: right  ?Lot: [id number] ?Exp: 11/ 2024  ?  ?Patient tolerated well, no complications were noted ?  ?Performed by: Elberta Leatherwood CMA  ?  ?Per Dr. Larene Beach patient is to continue therapy for for life. Patient's next follow up was scheduled for Eligard is scheduled for  10/30/2021. This appointment was scheduled using wheel and given to patient today along with reminder continue on Vitamin D 800-1000iu and Calium 1000-'1200mg'$  daily while on Androgen Deprivation Therapy.  ?

## 2021-05-04 ENCOUNTER — Telehealth: Payer: Self-pay

## 2021-05-04 LAB — PSA: Prostate Specific Ag, Serum: 0.2 ng/mL (ref 0.0–4.0)

## 2021-05-04 NOTE — Telephone Encounter (Signed)
-----   Message from Debroah Loop, [place] sent at 05/04/2021  8:29 AM EDT ----- ?PSA is stable, great news. No need for further imaging. Keep lab and clinic visits as scheduled. ?----- Message ----- ?From: Interface, [facility] Lab Results In ?Sent: 05/04/2021   5:38 AM EDT ?To: Debroah Loop, PA-C ? ? ?

## 2021-05-04 NOTE — Telephone Encounter (Signed)
Notified pt as advised, pt expressed understanding.  ?

## 2021-06-27 ENCOUNTER — Ambulatory Visit (INDEPENDENT_AMBULATORY_CARE_PROVIDER_SITE_OTHER): Payer: BLUE CROSS/BLUE SHIELD | Admitting: Internal Medicine

## 2021-06-27 ENCOUNTER — Encounter: Payer: Self-pay | Admitting: Internal Medicine

## 2021-06-27 VITALS — BP 112/65 | HR 65 | Ht 70.0 in | Wt 145.5 lb

## 2021-06-27 DIAGNOSIS — N401 Enlarged prostate with lower urinary tract symptoms: Secondary | ICD-10-CM

## 2021-06-27 DIAGNOSIS — I10 Essential (primary) hypertension: Secondary | ICD-10-CM

## 2021-06-27 DIAGNOSIS — M889 Osteitis deformans of unspecified bone: Secondary | ICD-10-CM | POA: Diagnosis not present

## 2021-06-27 DIAGNOSIS — F5101 Primary insomnia: Secondary | ICD-10-CM | POA: Diagnosis not present

## 2021-06-27 DIAGNOSIS — N138 Other obstructive and reflux uropathy: Secondary | ICD-10-CM

## 2021-06-27 DIAGNOSIS — M25551 Pain in right hip: Secondary | ICD-10-CM

## 2021-06-27 MED ORDER — TRAZODONE HCL 50 MG PO TABS: 25.0000 mg | ORAL_TABLET | Freq: Every evening | ORAL | 3 refills | Status: DC | PRN

## 2021-06-27 NOTE — Addendum Note (Signed)
Addended by: Alois Cliche on: 06/27/2021 10:52 AM   Modules accepted: Orders

## 2021-06-27 NOTE — Assessment & Plan Note (Signed)
Patient was started on trazodone

## 2021-06-27 NOTE — Assessment & Plan Note (Signed)
Stable

## 2021-06-27 NOTE — Assessment & Plan Note (Signed)
Refer to bone specialist

## 2021-06-27 NOTE — Assessment & Plan Note (Signed)

## 2021-06-27 NOTE — Progress Notes (Signed)
Established Patient Office Visit  Subjective:  Patient ID: Dean White, male    DOB: 06/17/1928  Age: 88 y.o. MRN: [id number]  CC:  Chief Complaint  Patient presents with   Hip Pain    Patient having right hip pain that has been a problem for years but seems to be worsening.     Hip Pain  The incident occurred at work. The pain is at a severity of 4/10.  Insomnia Primary symptoms: fragmented sleep.   The current episode started one month. The onset quality is gradual. The symptoms are aggravated by anxiety. Types of beverages you drink: coffee.    Dean White presents for insomnia  Past Medical History:  Diagnosis Date   Adenocarcinoma of prostate ([place])    BPH with obstruction/lower urinary tract symptoms    HLD (hyperlipidemia)    Paget disease of bone     Past Surgical History:  Procedure Laterality Date   CHOLECYSTECTOMY     PROSTATE BIOPSY     PROSTATE SURGERY      Family History  Problem Relation Age of Onset   Cancer Son        melanoma   Breast cancer Sister    Cancer Brother    Kidney disease Neg Hx    Prostate cancer Neg Hx     Social History   Socioeconomic History   Marital status: Divorced    Spouse name: Not on file   Number of children: Not on file   Years of education: Not on file   Highest education level: Not on file  Occupational History   Not on file  Tobacco Use   Smoking status: Former   Smokeless tobacco: Never   Tobacco comments:    quit 50 years ago  Substance and Sexual Activity   Alcohol use: No    Alcohol/week: 0.0 standard drinks of alcohol   Drug use: No   Sexual activity: Not on file  Other Topics Concern   Not on file  Social History Narrative   Not on file   Social Determinants of Health   Financial Resource Strain: Low Risk  (12/28/2020)   Overall Financial Resource Strain (CARDIA)    Difficulty of Paying Living Expenses: Not hard at all  Food Insecurity: No Food Insecurity (12/28/2020)   Hunger  Vital Sign    Worried About Running Out of Food in the Last Year: Never true    Ran Out of Food in the Last Year: Never true  Transportation Needs: No Transportation Needs (12/28/2020)   PRAPARE - Hydrologist (Medical): No    Lack of Transportation (Non-Medical): No  Physical Activity: Insufficiently Active (12/28/2020)   Exercise Vital Sign    Days of Exercise per Week: 2 days    Minutes of Exercise per Session: 30 min  Stress: No Stress Concern Present (12/28/2020)   [place]    Feeling of Stress : Not at all  Social Connections: Moderately Isolated (12/28/2020)   Social Connection and Isolation Panel [NHANES]    Frequency of Communication with Friends and Family: More than three times a week    Frequency of Social Gatherings with Friends and Family: More than three times a week    Attends Religious Services: More than 4 times per year    Active Member of [employer] or Organizations: No    Attends Archivist Meetings: Never    Marital Status:  Widowed  Intimate Partner Violence: Not At Risk (12/28/2020)   Humiliation, Afraid, Rape, and Kick questionnaire    Fear of Current or Ex-Partner: No    Emotionally Abused: No    Physically Abused: No    Sexually Abused: No     Current Outpatient Medications:    cholecalciferol (VITAMIN D) 1000 UNITS tablet, Take 1,000 Units by mouth daily., Disp: , Rfl:    latanoprost (XALATAN) 0.005 % ophthalmic solution, place 1 drop into both eyes at bedtime, Disp: , Rfl: 0   Leuprolide Acetate, 6 Month, (LUPRON) 45 MG injection, Inject 45 mg into the muscle every 6 (six) months., Disp: , Rfl:    lovastatin (MEVACOR) 40 MG tablet, TAKE 1 TABLET BY MOUTH DAILY, Disp: 90 tablet, Rfl: 3   Misc Natural Products (PROSTATE HEALTH PO), Take by mouth., Disp: , Rfl:    Multiple Vitamin (MULTIVITAMIN) tablet, Take 1 tablet by mouth daily., Disp: , Rfl:    No  Known Allergies  ROS Review of Systems  Constitutional: Negative.   HENT: Negative.  Negative for nosebleeds.   Eyes: Negative.   Respiratory: Negative.    Cardiovascular: Negative.   Gastrointestinal: Negative.   Endocrine: Negative.  Negative for heat intolerance.  Genitourinary: Negative.  Negative for dysuria.  Musculoskeletal: Negative.   Skin: Negative.   Allergic/Immunologic: Negative.   Neurological:  Positive for weakness.  Hematological: Negative.  Negative for adenopathy.  Psychiatric/Behavioral:  Negative for agitation and confusion. The patient has insomnia.   All other systems reviewed and are negative.     Objective:    Physical Exam Vitals reviewed.  Constitutional:      Appearance: Normal appearance.  HENT:     Mouth/Throat:     Mouth: Mucous membranes are moist.  Eyes:     Pupils: Pupils are equal, round, and reactive to light.  Neck:     Vascular: No carotid bruit.  Cardiovascular:     Rate and Rhythm: Normal rate and regular rhythm.     Pulses: Normal pulses.     Heart sounds: Normal heart sounds.  Pulmonary:     Effort: Pulmonary effort is normal.     Breath sounds: Normal breath sounds.  Abdominal:     General: Bowel sounds are normal.     Palpations: Abdomen is soft. There is no hepatomegaly, splenomegaly or mass.     Tenderness: There is no abdominal tenderness.     Hernia: No hernia is present.  Musculoskeletal:     Cervical back: Neck supple.     Right lower leg: No edema.     Left lower leg: No edema.     Comments: Pain in the right hip Pain in the both hands  Skin:    Findings: No rash.  Neurological:     Mental Status: He is alert and oriented to person, place, and time.     Motor: No weakness.  Psychiatric:        Mood and Affect: Mood normal.        Behavior: Behavior normal.     BP 112/65   Pulse 65   Ht '5\' 10"'  (1.778 m)   Wt 145 lb 8 oz (66 kg)   BMI 20.88 kg/m  Wt Readings from Last 3 Encounters:  06/27/21 145 lb  8 oz (66 kg)  05/03/21 152 lb (68.9 kg)  03/27/21 152 lb 6.4 oz (69.1 kg)     Health Maintenance Due  Topic Date Due   TETANUS/TDAP  Never done  Zoster Vaccines- Shingrix (1 of 2) Never done   Pneumonia Vaccine 58+ Years old (1 - PCV) Never done   COVID-19 Vaccine (4 - Booster for Pfizer series) 12/10/2019    There are no preventive care reminders to display for this patient.  Lab Results  Component Value Date   TSH 2.60 03/28/2020   Lab Results  Component Value Date   WBC 4.8 01/30/2021   HGB 14.1 01/30/2021   HCT 43.3 01/30/2021   MCV 93.9 01/30/2021   PLT 228 01/30/2021   Lab Results  Component Value Date   NA 141 01/30/2021   K 4.1 01/30/2021   CO2 29 01/30/2021   GLUCOSE 96 01/30/2021   BUN 15 01/30/2021   CREATININE 1.01 01/30/2021   BILITOT 0.4 01/30/2021   ALKPHOS 73 06/20/2017   AST 18 01/30/2021   ALT 10 01/30/2021   PROT 6.6 01/30/2021   ALBUMIN 4.1 06/20/2017   CALCIUM 9.8 01/30/2021   ANIONGAP 8 06/20/2017   EGFR 71 01/30/2021   Lab Results  Component Value Date   CHOL 146 03/28/2020   Lab Results  Component Value Date   HDL 60 03/28/2020   Lab Results  Component Value Date   LDLCALC 65 03/28/2020   Lab Results  Component Value Date   TRIG 124 03/28/2020   Lab Results  Component Value Date   CHOLHDL 2.4 03/28/2020   No results found for: "HGBA1C"    Assessment & Plan:   Problem List Items Addressed This Visit       Cardiovascular and Mediastinum   Essential hypertension    The following hypertensive lifestyle modification were recommended and discussed:  1. Limiting alcohol intake to less than 1 oz/day of ethanol:(24 oz of beer or 8 oz of wine or 2 oz of 100-proof whiskey). 2. Take baby ASA 81 mg daily. 3. Importance of regular aerobic exercise and losing weight. 4. Reduce dietary saturated fat and cholesterol intake for overall cardiovascular health. 5. Maintaining adequate dietary potassium, calcium, and magnesium  intake. 6. Regular monitoring of the blood pressure. 7. Reduce sodium intake to less than 100 mmol/day (less than 2.3 gm of sodium or less than 6 gm of sodium choride)         Musculoskeletal and Integument   Paget's bone disease     Genitourinary   BPH with obstruction/lower urinary tract symptoms    Stable        Other   Hip pain    Refer to bone specialist      Primary insomnia - Primary    Patient was started on trazodone       No orders of the defined types were placed in this encounter.   Follow-up: No follow-ups on file.    Cletis Athens, MD

## 2021-07-04 ENCOUNTER — Ambulatory Visit: Payer: BLUE CROSS/BLUE SHIELD | Admitting: Orthopedic Surgery

## 2021-08-02 ENCOUNTER — Other Ambulatory Visit: Payer: Medicare (Managed Care)

## 2021-08-02 ENCOUNTER — Other Ambulatory Visit: Payer: Self-pay | Admitting: *Deleted

## 2021-08-02 DIAGNOSIS — C61 Malignant neoplasm of prostate: Secondary | ICD-10-CM

## 2021-08-03 LAB — PSA: Prostate Specific Ag, Serum: 0.1 ng/mL (ref 0.0–4.0)

## 2021-08-27 ENCOUNTER — Ambulatory Visit (INDEPENDENT_AMBULATORY_CARE_PROVIDER_SITE_OTHER): Payer: Medicare Other | Admitting: Internal Medicine

## 2021-08-27 ENCOUNTER — Encounter: Payer: Self-pay | Admitting: Internal Medicine

## 2021-08-27 VITALS — BP 104/62 | HR 73 | Ht 70.0 in | Wt 147.5 lb

## 2021-08-27 DIAGNOSIS — E7849 Other hyperlipidemia: Secondary | ICD-10-CM | POA: Diagnosis not present

## 2021-08-27 DIAGNOSIS — I10 Essential (primary) hypertension: Secondary | ICD-10-CM | POA: Diagnosis not present

## 2021-08-27 DIAGNOSIS — M889 Osteitis deformans of unspecified bone: Secondary | ICD-10-CM | POA: Diagnosis not present

## 2021-08-27 NOTE — Assessment & Plan Note (Signed)
Hypercholesterolemia  I advised the patient to follow Mediterranean diet This diet is rich in fruits vegetables and whole grain, and This diet is also rich in fish and lean meat Patient should also eat a handful of almonds or walnuts daily Recent heart study indicated that average follow-up on this kind of diet reduces the cardiovascular mortality by 50 to 70%== 

## 2021-08-27 NOTE — Assessment & Plan Note (Signed)
Stable patient does not complain of any back pain chest is clear heart is regular

## 2021-08-27 NOTE — Progress Notes (Signed)
Established Patient Office Visit  Subjective:  Patient ID: Dean White, male    DOB: 1928/06/18  Age: 88 y.o. MRN: [id number]  CC:  Chief Complaint  Patient presents with   Hypertension    3 month follow up    Hypertension Pertinent negatives include no chest pain or shortness of breath.    Dean White presents for check up  Past Medical History:  Diagnosis Date   Adenocarcinoma of prostate ([place])    BPH with obstruction/lower urinary tract symptoms    HLD (hyperlipidemia)    Paget disease of bone     Past Surgical History:  Procedure Laterality Date   CHOLECYSTECTOMY     PROSTATE BIOPSY     PROSTATE SURGERY      Family History  Problem Relation Age of Onset   Cancer Son        melanoma   Breast cancer Sister    Cancer Brother    Kidney disease Neg Hx    Prostate cancer Neg Hx     Social History   Socioeconomic History   Marital status: Divorced    Spouse name: Not on file   Number of children: Not on file   Years of education: Not on file   Highest education level: Not on file  Occupational History   Not on file  Tobacco Use   Smoking status: Former   Smokeless tobacco: Never   Tobacco comments:    quit 50 years ago  Substance and Sexual Activity   Alcohol use: No    Alcohol/week: 0.0 standard drinks of alcohol   Drug use: No   Sexual activity: Not on file  Other Topics Concern   Not on file  Social History Narrative   Not on file   Social Determinants of Health   Financial Resource Strain: Low Risk  (12/28/2020)   Overall Financial Resource Strain (CARDIA)    Difficulty of Paying Living Expenses: Not hard at all  Food Insecurity: No Food Insecurity (12/28/2020)   Hunger Vital Sign    Worried About Running Out of Food in the Last Year: Never true    [place] in the Last Year: Never true  Transportation Needs: No Transportation Needs (12/28/2020)   PRAPARE - Hydrologist (Medical): No    Lack  of Transportation (Non-Medical): No  Physical Activity: Insufficiently Active (12/28/2020)   Exercise Vital Sign    Days of Exercise per Week: 2 days    Minutes of Exercise per Session: 30 min  Stress: No Stress Concern Present (12/28/2020)   [place]    Feeling of Stress : Not at all  Social Connections: Moderately Isolated (12/28/2020)   Social Connection and Isolation Panel [NHANES]    Frequency of Communication with Friends and Family: More than three times a week    Frequency of Social Gatherings with Friends and Family: More than three times a week    Attends Religious Services: More than 4 times per year    Active Member of [employer] or Organizations: No    Attends Archivist Meetings: Never    Marital Status: Widowed  Intimate Partner Violence: Not At Risk (12/28/2020)   Humiliation, Afraid, Rape, and Kick questionnaire    Fear of Current or Ex-Partner: No    Emotionally Abused: No    Physically Abused: No    Sexually Abused: No     Current Outpatient  Medications:    cholecalciferol (VITAMIN D) 1000 UNITS tablet, Take 1,000 Units by mouth daily., Disp: , Rfl:    latanoprost (XALATAN) 0.005 % ophthalmic solution, place 1 drop into both eyes at bedtime, Disp: , Rfl: 0   Leuprolide Acetate, 6 Month, (LUPRON) 45 MG injection, Inject 45 mg into the muscle every 6 (six) months., Disp: , Rfl:    lovastatin (MEVACOR) 40 MG tablet, TAKE 1 TABLET BY MOUTH DAILY, Disp: 90 tablet, Rfl: 3   Misc Natural Products (PROSTATE HEALTH PO), Take by mouth., Disp: , Rfl:    Multiple Vitamin (MULTIVITAMIN) tablet, Take 1 tablet by mouth daily., Disp: , Rfl:    traZODone (DESYREL) 50 MG tablet, Take 0.5-1 tablets (25-50 mg total) by mouth at bedtime as needed for sleep., Disp: 30 tablet, Rfl: 3   No Known Allergies  ROS Review of Systems  Respiratory:  Negative for apnea, choking and shortness of breath.    Cardiovascular:  Negative for chest pain.  Gastrointestinal:  Negative for abdominal distention.  Genitourinary:  Negative for enuresis.  Musculoskeletal:  Negative for arthralgias.      Objective:    Physical Exam Cardiovascular:     Rate and Rhythm: Normal rate.  Pulmonary:     Effort: Pulmonary effort is normal.     Breath sounds: Normal breath sounds.  Abdominal:     General: Abdomen is flat.     Palpations: Abdomen is soft.  Musculoskeletal:        General: Normal range of motion.     BP 104/62   Pulse 73   Ht _0  (1.778 m)   Wt 147 lb 8 oz (66.9 kg)   BMI 21.16 kg/m  Wt Readings from Last 3 Encounters:  08/27/21 147 lb 8 oz (66.9 kg)  06/27/21 145 lb 8 oz (66 kg)  05/03/21 152 lb (68.9 kg)     Health Maintenance Due  Topic Date Due   TETANUS/TDAP  Never done   Zoster Vaccines- Shingrix (1 of 2) Never done   Pneumonia Vaccine 70+ Years old (1 - PCV) Never done   COVID-19 Vaccine (4 - Pfizer risk series) 12/10/2019   INFLUENZA VACCINE  08/14/2021    There are no preventive care reminders to display for this patient.  Lab Results  Component Value Date   TSH 2.60 03/28/2020   Lab Results  Component Value Date   WBC 4.8 01/30/2021   HGB 14.1 01/30/2021   HCT 43.3 01/30/2021   MCV 93.9 01/30/2021   PLT 228 01/30/2021   Lab Results  Component Value Date   NA 141 01/30/2021   K 4.1 01/30/2021   CO2 29 01/30/2021   GLUCOSE 96 01/30/2021   BUN 15 01/30/2021   CREATININE 1.01 01/30/2021   BILITOT 0.4 01/30/2021   ALKPHOS 73 06/20/2017   AST 18 01/30/2021   ALT 10 01/30/2021   PROT 6.6 01/30/2021   ALBUMIN 4.1 06/20/2017   CALCIUM 9.8 01/30/2021   ANIONGAP 8 06/20/2017   EGFR 71 01/30/2021   Lab Results  Component Value Date   CHOL 146 03/28/2020   Lab Results  Component Value Date   HDL 60 03/28/2020   Lab Results  Component Value Date   LDLCALC 65 03/28/2020   Lab Results  Component Value Date   TRIG 124 03/28/2020   Lab  Results  Component Value Date   CHOLHDL 2.4 03/28/2020   No results found for: "HGBA1C"    Assessment & Plan:   Problem  List Items Addressed This Visit       Cardiovascular and Mediastinum   Essential hypertension - Primary     Patient denies any chest pain or shortness of breath there is no history of palpitation or paroxysmal nocturnal dyspnea   patient was advised to follow low-salt low-cholesterol diet    ideally I want to keep systolic blood pressure below 130 mmHg, patient was asked to check blood pressure one times a week and give me a report on that.  Patient will be follow-up in 3 months  or earlier as needed, patient will call me back for any change in the cardiovascular symptoms Patient was advised to buy a book from local bookstore concerning blood pressure and read several chapters  every day.  This will be supplemented by some of the material we will give him from the office.  Patient should also utilize other resources like YouTube and Internet to learn more about the blood pressure and the diet.        Musculoskeletal and Integument   Paget's bone disease    Stable patient does not complain of any back pain chest is clear heart is regular        Other   HLD (hyperlipidemia)    Hypercholesterolemia  I advised the patient to follow Mediterranean diet This diet is rich in fruits vegetables and whole grain, and This diet is also rich in fish and lean meat Patient should also eat a handful of almonds or walnuts daily Recent heart study indicated that average follow-up on this kind of diet reduces the cardiovascular mortality by 50 to 70%==       No orders of the defined types were placed in this encounter.   Follow-up: No follow-ups on file.    Cletis Athens, MD

## 2021-08-27 NOTE — Assessment & Plan Note (Signed)

## 2021-10-05 ENCOUNTER — Other Ambulatory Visit: Payer: Self-pay | Admitting: Internal Medicine

## 2021-10-23 ENCOUNTER — Ambulatory Visit: Payer: Medicare Other | Admitting: Urology

## 2021-10-29 NOTE — Progress Notes (Signed)
10/30/2021 1:50 PM   Dean White 11-20-1928 [id number]  Referring provider: Cletis Athens, MD [address] [employer],  [place] [address]  Urological history: 1. 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 I am skeptical of superimposed metastatic disease in these regions -receiving ADT  No chief complaint on file.   HPI: Dean White is a 88 y.o. male who presents today for 6 month follow  up.    PMH: Past Medical History:  Diagnosis Date   Adenocarcinoma of prostate ([place])    BPH with obstruction/lower urinary tract symptoms    HLD (hyperlipidemia)    Paget disease of bone     Surgical History: Past Surgical History:  Procedure Laterality Date   CHOLECYSTECTOMY     PROSTATE BIOPSY     PROSTATE SURGERY      Home Medications:  Allergies as of 10/30/2021   No Known Allergies      Medication List        Accurate as of October 29, 2021  1:50 PM. If you have any questions, ask your nurse or doctor.          cholecalciferol 1000 units tablet Commonly known as: VITAMIN D Take 1,000 Units by mouth daily.   latanoprost 0.005 % ophthalmic solution Commonly known as: XALATAN place 1 drop into both eyes at bedtime   Leuprolide Acetate (6 Month) 45 MG injection Commonly known as: LUPRON Inject 45 mg into the muscle every 6 (six) months.   lovastatin 40 MG tablet Commonly known as: MEVACOR TAKE 1 TABLET BY MOUTH DAILY   multivitamin tablet Take 1 tablet by mouth daily.   PROSTATE  HEALTH PO Take by mouth.   traZODone 50 MG tablet Commonly known as: DESYREL Take 0.5-1 tablets (25-50 mg total) by mouth at bedtime as needed for sleep.        Allergies: No Known Allergies  Family History: Family History  Problem Relation Age of Onset   Cancer Son        melanoma   Breast cancer Sister    Cancer Brother    Kidney disease Neg Hx    Prostate cancer Neg Hx     Social History:  reports that he has quit smoking. He has never used smokeless tobacco. He reports that he does not drink alcohol and does not use drugs.  ROS: Pertinent ROS in HPI  Physical Exam: There were no vitals taken for this visit.  Constitutional:  Well nourished. Alert and oriented, No acute distress. HEENT: [place] AT, moist mucus membranes.  Trachea midline, no masses. Cardiovascular: No clubbing, cyanosis, or edema. Respiratory: Normal respiratory effort, no increased work of breathing. GI: Abdomen is soft, non tender, non distended, no abdominal masses. Liver and spleen not palpable.  No hernias appreciated.  Stool sample for occult testing is not indicated.   GU: No CVA tenderness.  No bladder fullness or masses.  Patient with circumcised/uncircumcised phallus. ***Foreskin easily retracted***  Urethral meatus is patent.  No penile discharge. No penile lesions or rashes.  Scrotum without lesions, cysts, rashes and/or edema.  Testicles are located scrotally bilaterally. No masses are appreciated in the testicles. Left and right epididymis are normal. Rectal: Patient with  normal sphincter tone. Anus and perineum without scarring or rashes. No rectal masses are appreciated. Prostate is approximately *** grams, *** nodules are appreciated. Seminal vesicles are normal. Skin: No rashes, bruises or suspicious lesions. Lymph: No cervical or inguinal adenopathy. Neurologic: Grossly intact, no focal deficits, moving all 4 extremities. Psychiatric: Normal mood and affect.  Laboratory Data: Glucose 70 -  110 mg/dL 109   Sodium 136 - 145 mmol/L 140   Potassium 3.6 - 5.1 mmol/L 4.2   Chloride 97 - 109 mmol/L 107   Carbon Dioxide (CO2) 22.0 - 32.0 mmol/L 29.0   Urea Nitrogen (BUN) 7 - 25 mg/dL 13   Creatinine 0.7 - 1.3 mg/dL 0.9   Glomerular Filtration Rate (eGFR) >60 mL/min/1.73sq m 96   Calcium 8.7 - 10.3 mg/dL 9.4   AST  8 - 39 U/L 41 High    ALT  6 - 57 U/L 51   Alk Phos (alkaline Phosphatase) 34 - 104 U/L 97   Albumin 3.5 - 4.8 g/dL 4.1   Bilirubin, Total 0.3 - 1.2 mg/dL 0.5   Protein, Total 6.1 - 7.9 g/dL 6.5   A/G Ratio 1.0 - 5.0 gm/dL 1.7   Resulting Agency  [facility]   Specimen Collected: 08/22/21 10:08   Performed by: [place]: 08/22/21 12:45  Received From: [facility]  Result Received: 10/29/21 13:51  I have reviewed the labs.   Pertinent Imaging: N/A  Assessment & Plan:  ***  1. Prostate cancer -PSA pending  -6 month Eligard given today   No follow-ups on file.  These notes generated with voice recognition software. I apologize for typographical errors.  [place], [place] [address]  [place], [place] [address] [phone]

## 2021-10-30 ENCOUNTER — Ambulatory Visit: Payer: Medicare (Managed Care) | Admitting: Physician Assistant

## 2021-10-30 ENCOUNTER — Encounter: Payer: Self-pay | Admitting: Urology

## 2021-10-30 ENCOUNTER — Ambulatory Visit (INDEPENDENT_AMBULATORY_CARE_PROVIDER_SITE_OTHER): Payer: Medicare Other | Admitting: Urology

## 2021-10-30 ENCOUNTER — Telehealth: Payer: Self-pay | Admitting: Urology

## 2021-10-30 VITALS — BP 130/70 | HR 74 | Ht 67.0 in | Wt 155.0 lb

## 2021-10-30 DIAGNOSIS — C61 Malignant neoplasm of prostate: Secondary | ICD-10-CM | POA: Diagnosis not present

## 2021-10-30 MED ORDER — LEUPROLIDE ACETATE (6 MONTH) 45 MG ~~LOC~~ KIT
45.0000 mg | PACK | Freq: Once | SUBCUTANEOUS | Status: AC
  Administered 2021-10-30: 45 mg via SUBCUTANEOUS

## 2021-10-30 NOTE — Progress Notes (Signed)
Eligard SubQ Injection    Due to Prostate Cancer patient is present today for a Eligard Injection.   Medication: Eligard 6 month Dose: 45 mg  Location: right  Lot: [id number] Exp: 01/2023   Patient tolerated well, no complications were noted   Performed by: Gaspar Cola CMA   Per Dr. Larene Beach patient is to continue therapy for for life. Patient's next follow up was scheduled for Eligard is scheduled for  05/15/2022 This appointment was scheduled using wheel and given to patient today along with reminder continue on Vitamin D 800-1000iu and Calium 1000-'1200mg'$  daily while on Androgen Deprivation Therapy.

## 2021-10-30 NOTE — Telephone Encounter (Signed)
Per Radonna Ricker at [employer] [phone]), Eligard [phone]) is approved #[id number] valid thru 10/30/21 - 10/30/22

## 2021-10-31 LAB — PSA: Prostate Specific Ag, Serum: 0.1 ng/mL (ref 0.0–4.0)

## 2021-11-27 ENCOUNTER — Ambulatory Visit: Payer: Medicare (Managed Care) | Admitting: Internal Medicine

## 2021-12-10 ENCOUNTER — Encounter: Payer: Self-pay | Admitting: Internal Medicine

## 2021-12-10 ENCOUNTER — Ambulatory Visit (INDEPENDENT_AMBULATORY_CARE_PROVIDER_SITE_OTHER): Payer: Medicare Other | Admitting: Internal Medicine

## 2021-12-10 VITALS — BP 98/65 | HR 72 | Temp 97.0°F | Ht 67.0 in | Wt 147.2 lb

## 2021-12-10 DIAGNOSIS — N401 Enlarged prostate with lower urinary tract symptoms: Secondary | ICD-10-CM | POA: Diagnosis not present

## 2021-12-10 DIAGNOSIS — M25551 Pain in right hip: Secondary | ICD-10-CM

## 2021-12-10 DIAGNOSIS — C61 Malignant neoplasm of prostate: Secondary | ICD-10-CM | POA: Diagnosis not present

## 2021-12-10 DIAGNOSIS — I950 Idiopathic hypotension: Secondary | ICD-10-CM

## 2021-12-10 DIAGNOSIS — N138 Other obstructive and reflux uropathy: Secondary | ICD-10-CM

## 2021-12-10 NOTE — Progress Notes (Signed)
Established Patient Office Visit  Subjective:  Patient ID: Dean White, male    DOB: 07-Dec-1928  Age: 88 y.o. MRN: [id number]  CC:  Chief Complaint  Patient presents with   Hypertension    3 month fu. Has had several bloody noses not sure why    Hypertension    [name] presents for rt hip pain, and nasal bleed  Past Medical History:  Diagnosis Date   Adenocarcinoma of prostate ([place])    BPH with obstruction/lower urinary tract symptoms    HLD (hyperlipidemia)    Paget disease of bone     Past Surgical History:  Procedure Laterality Date   CHOLECYSTECTOMY     PROSTATE BIOPSY     PROSTATE SURGERY      Family History  Problem Relation Age of Onset   Cancer Son        melanoma   Breast cancer Sister    Cancer Brother    Kidney disease Neg Hx    Prostate cancer Neg Hx     Social History   Socioeconomic History   Marital status: Divorced    Spouse name: Not on file   Number of children: Not on file   Years of education: Not on file   Highest education level: Not on file  Occupational History   Not on file  Tobacco Use   Smoking status: Former   Smokeless tobacco: Never   Tobacco comments:    quit 50 years ago  Substance and Sexual Activity   Alcohol use: No    Alcohol/week: 0.0 standard drinks of alcohol   Drug use: No   Sexual activity: Not on file  Other Topics Concern   Not on file  Social History Narrative   Not on file   Social Determinants of Health   Financial Resource Strain: Low Risk  (12/28/2020)   Overall Financial Resource Strain (CARDIA)    Difficulty of Paying Living Expenses: Not hard at all  Food Insecurity: No Food Insecurity (12/28/2020)   Hunger Vital Sign    Worried About Running Out of Food in the Last Year: Never true    Ran Out of Food in the Last Year: Never true  Transportation Needs: No Transportation Needs (12/28/2020)   PRAPARE - Hydrologist (Medical): No    Lack of  Transportation (Non-Medical): No  Physical Activity: Insufficiently Active (12/28/2020)   Exercise Vital Sign    Days of Exercise per Week: 2 days    Minutes of Exercise per Session: 30 min  Stress: No Stress Concern Present (12/28/2020)   [place]    Feeling of Stress : Not at all  Social Connections: Moderately Isolated (12/28/2020)   Social Connection and Isolation Panel [NHANES]    Frequency of Communication with Friends and Family: More than three times a week    Frequency of Social Gatherings with Friends and Family: More than three times a week    Attends Religious Services: More than 4 times per year    Active Member of [employer] or Organizations: No    Attends Archivist Meetings: Never    Marital Status: Widowed  Intimate Partner Violence: Not At Risk (12/28/2020)   Humiliation, Afraid, Rape, and Kick questionnaire    Fear of Current or Ex-Partner: No    Emotionally Abused: No    Physically Abused: No    Sexually Abused: No     Current  Outpatient Medications:    cholecalciferol (VITAMIN D) 1000 UNITS tablet, Take 1,000 Units by mouth daily., Disp: , Rfl:    latanoprost (XALATAN) 0.005 % ophthalmic solution, place 1 drop into both eyes at bedtime, Disp: , Rfl: 0   Leuprolide Acetate, 6 Month, (LUPRON) 45 MG injection, Inject 45 mg into the muscle every 6 (six) months., Disp: , Rfl:    lovastatin (MEVACOR) 40 MG tablet, TAKE 1 TABLET BY MOUTH DAILY, Disp: 90 tablet, Rfl: 3   Misc Natural Products (PROSTATE HEALTH PO), Take by mouth., Disp: , Rfl:    Multiple Vitamin (MULTIVITAMIN) tablet, Take 1 tablet by mouth daily., Disp: , Rfl:    traZODone (DESYREL) 50 MG tablet, Take 0.5-1 tablets (25-50 mg total) by mouth at bedtime as needed for sleep., Disp: 30 tablet, Rfl: 3   No Known Allergies  ROS Review of Systems  Constitutional: Negative.   HENT: Negative.    Eyes: Negative.   Respiratory:  Negative.    Cardiovascular: Negative.   Gastrointestinal: Negative.   Endocrine: Negative.   Genitourinary: Negative.   Musculoskeletal: Negative.   Skin: Negative.   Allergic/Immunologic: Negative.   Neurological: Negative.   Hematological: Negative.   Psychiatric/Behavioral: Negative.    All other systems reviewed and are negative.     Objective:    Physical Exam Vitals reviewed.  Constitutional:      Appearance: Normal appearance.  HENT:     Mouth/Throat:     Mouth: Mucous membranes are moist.  Eyes:     Pupils: Pupils are equal, round, and reactive to light.  Neck:     Vascular: No carotid bruit.  Cardiovascular:     Rate and Rhythm: Normal rate and regular rhythm.     Pulses: Normal pulses.     Heart sounds: Normal heart sounds.  Pulmonary:     Effort: Pulmonary effort is normal.     Breath sounds: Normal breath sounds.  Abdominal:     General: Bowel sounds are normal.     Palpations: Abdomen is soft. There is no hepatomegaly, splenomegaly or mass.     Tenderness: There is no abdominal tenderness.     Hernia: No hernia is present.  Musculoskeletal:     Cervical back: Neck supple.     Right lower leg: No edema.     Left lower leg: No edema.  Skin:    Findings: No rash.  Neurological:     Mental Status: He is alert and oriented to person, place, and time.     Motor: No weakness.  Psychiatric:        Mood and Affect: Mood normal.        Behavior: Behavior normal.     BP 98/65   Pulse 72   Temp (!) 97 F (36.1 C) (Temporal)   SpO2 94%  Wt Readings from Last 3 Encounters:  10/30/21 155 lb (70.3 kg)  08/27/21 147 lb 8 oz (66.9 kg)  06/27/21 145 lb 8 oz (66 kg)     Health Maintenance Due  Topic Date Due   Pneumonia Vaccine 25+ Years old (1 - PCV) Never done   Zoster Vaccines- Shingrix (1 of 2) Never done   INFLUENZA VACCINE  08/14/2021   COVID-19 Vaccine (4 - 2023-24 season) 09/14/2021   Medicare Annual Wellness (AWV)  12/28/2021    There are  no preventive care reminders to display for this patient.  Lab Results  Component Value Date   TSH 2.60 03/28/2020   Lab Results  Component Value Date   WBC 4.8 01/30/2021   HGB 14.1 01/30/2021   HCT 43.3 01/30/2021   MCV 93.9 01/30/2021   PLT 228 01/30/2021   Lab Results  Component Value Date   NA 141 01/30/2021   K 4.1 01/30/2021   CO2 29 01/30/2021   GLUCOSE 96 01/30/2021   BUN 15 01/30/2021   CREATININE 1.01 01/30/2021   BILITOT 0.4 01/30/2021   ALKPHOS 73 06/20/2017   AST 18 01/30/2021   ALT 10 01/30/2021   PROT 6.6 01/30/2021   ALBUMIN 4.1 06/20/2017   CALCIUM 9.8 01/30/2021   ANIONGAP 8 06/20/2017   EGFR 71 01/30/2021   Lab Results  Component Value Date   CHOL 146 03/28/2020   Lab Results  Component Value Date   HDL 60 03/28/2020   Lab Results  Component Value Date   LDLCALC 65 03/28/2020   Lab Results  Component Value Date   TRIG 124 03/28/2020   Lab Results  Component Value Date   CHOLHDL 2.4 03/28/2020   No results found for: "HGBA1C"    Assessment & Plan:   Problem List Items Addressed This Visit       Cardiovascular and Mediastinum   Idiopathic hypotension - Primary    Drink plenty of fluids        Genitourinary   Malignant neoplasm of prostate ([place])    Refer to urologist      BPH with obstruction/lower urinary tract symptoms    Last PSA level okay.  Refer to urology.  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        Other   Annual physical exam    Patient has lost weight  blood pressure is low to drink he was advised fluid.    Heart is regular chest decreased breath sounds.    He is tender in the right hip area and the lower back.  He was able to walk by himself he drove to my office himself.      Hip pain    No orders of the defined types were placed in this encounter.   Follow-up: No follow-ups on file.    Cletis Athens, MD

## 2021-12-10 NOTE — Assessment & Plan Note (Signed)
Refer to urologist 

## 2021-12-10 NOTE — Assessment & Plan Note (Signed)
Last PSA level okay.  Refer to urology.  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

## 2021-12-10 NOTE — Assessment & Plan Note (Signed)
Patient has lost weight  blood pressure is low to drink he was advised fluid.    Heart is regular chest decreased breath sounds.    He is tender in the right hip area and the lower back.  He was able to walk by himself he drove to my office himself.

## 2021-12-10 NOTE — Assessment & Plan Note (Signed)
Drink plenty of fluids

## 2021-12-18 ENCOUNTER — Ambulatory Visit (INDEPENDENT_AMBULATORY_CARE_PROVIDER_SITE_OTHER): Payer: Medicare Other

## 2021-12-18 DIAGNOSIS — Z23 Encounter for immunization: Secondary | ICD-10-CM

## 2022-01-01 ENCOUNTER — Encounter: Payer: Medicare (Managed Care) | Admitting: Internal Medicine

## 2022-01-01 ENCOUNTER — Ambulatory Visit (INDEPENDENT_AMBULATORY_CARE_PROVIDER_SITE_OTHER): Payer: Medicare Other

## 2022-01-01 DIAGNOSIS — Z0000 Encounter for general adult medical examination without abnormal findings: Secondary | ICD-10-CM

## 2022-01-01 NOTE — Patient Instructions (Signed)
[name] , Thank you for taking time to come for your Medicare Wellness Visit. I appreciate your ongoing commitment to your health goals. Please review the following plan we discussed and let me know if I can assist you in the future.   Screening recommendations/referrals: Colonoscopy: aged out Recommended yearly ophthalmology/optometry visit for glaucoma screening and checkup Recommended yearly dental visit for hygiene and checkup  Vaccinations: Influenza vaccine: 12/18/21 Pneumococcal vaccine: n/d Tdap vaccine: n/d Shingles vaccine: n/d   Covid-19: 02/08/19, 03/01/19, 10/15/19  Advanced directives: no  Conditions/risks identified: none  Next appointment: Follow up in one year for your annual wellness visit.   Preventive Care 84 Years and Older, Male Preventive care refers to lifestyle choices and visits with your health care provider that can promote health and wellness. What does preventive care include? A yearly physical exam. This is also called an annual well check. Dental exams once or twice a year. Routine eye exams. Ask your health care provider how often you should have your eyes checked. Personal lifestyle choices, including: Daily care of your teeth and gums. Regular physical activity. Eating a healthy diet. Avoiding tobacco and drug use. Limiting alcohol use. Practicing safe sex. Taking low doses of aspirin every day. Taking vitamin and mineral supplements as recommended by your health care provider. What happens during an annual well check? The services and screenings done by your health care provider during your annual well check will depend on your age, overall health, lifestyle risk factors, and family history of disease. Counseling  Your health care provider may ask you questions about your: Alcohol use. Tobacco use. Drug use. Emotional well-being. Home and relationship well-being. Sexual activity. Eating habits. History of falls. Memory and ability to  understand (cognition). Work and work Statistician. Screening  You may have the following tests or measurements: Height, weight, and BMI. Blood pressure. Lipid and cholesterol levels. These may be checked every 5 years, or more frequently if you are over 58 years old. Skin check. Lung cancer screening. You may have this screening every year starting at age 19 if you have a 30-pack-year history of smoking and currently smoke or have quit within the past 15 years. Fecal occult blood test (FOBT) of the stool. You may have this test every year starting at age 75. Flexible sigmoidoscopy or colonoscopy. You may have a sigmoidoscopy every 5 years or a colonoscopy every 10 years starting at age 52. Prostate cancer screening. Recommendations will vary depending on your family history and other risks. Hepatitis C blood test. Hepatitis B blood test. Sexually transmitted disease (STD) testing. Diabetes screening. This is done by checking your blood sugar (glucose) after you have not eaten for a while (fasting). You may have this done every 1-3 years. Abdominal aortic aneurysm (AAA) screening. You may need this if you are a current or former smoker. Osteoporosis. You may be screened starting at age 26 if you are at high risk. Talk with your health care provider about your test results, treatment options, and if necessary, the need for more tests. Vaccines  Your health care provider may recommend certain vaccines, such as: Influenza vaccine. This is recommended every year. Tetanus, diphtheria, and acellular pertussis (Tdap, Td) vaccine. You may need a Td booster every 10 years. Zoster vaccine. You may need this after age 52. Pneumococcal 13-valent conjugate (PCV13) vaccine. One dose is recommended after age 25. Pneumococcal polysaccharide (PPSV23) vaccine. One dose is recommended after age 35. Talk to your health care provider about which screenings  and vaccines you need and how often you need them. This  information is not intended to replace advice given to you by your health care provider. Make sure you discuss any questions you have with your health care provider. Document Released: 01/27/2015 Document Revised: 09/20/2015 Document Reviewed: 11/01/2014 Elsevier Interactive Patient Education  2017 [place] Prevention in the Home Falls can cause injuries. They can happen to people of all ages. There are many things you can do to make your home safe and to help prevent falls. What can I do on the outside of my home? Regularly fix the edges of walkways and driveways and fix any cracks. Remove anything that might make you trip as you walk through a door, such as a raised step or threshold. Trim any bushes or trees on the path to your home. Use bright outdoor lighting. Clear any walking paths of anything that might make someone trip, such as rocks or tools. Regularly check to see if handrails are loose or broken. Make sure that both sides of any steps have handrails. Any raised decks and porches should have guardrails on the edges. Have any leaves, snow, or ice cleared regularly. Use sand or salt on walking paths during winter. Clean up any spills in your garage right away. This includes oil or grease spills. What can I do in the bathroom? Use night lights. Install grab bars by the toilet and in the tub and shower. Do not use towel bars as grab bars. Use non-skid mats or decals in the tub or shower. If you need to sit down in the shower, use a plastic, non-slip stool. Keep the floor dry. Clean up any water that spills on the floor as soon as it happens. Remove soap buildup in the tub or shower regularly. Attach bath mats securely with double-sided non-slip rug tape. Do not have throw rugs and other things on the floor that can make you trip. What can I do in the bedroom? Use night lights. Make sure that you have a light by your bed that is easy to reach. Do not use any sheets or  blankets that are too big for your bed. They should not hang down onto the floor. Have a firm chair that has side arms. You can use this for support while you get dressed. Do not have throw rugs and other things on the floor that can make you trip. What can I do in the kitchen? Clean up any spills right away. Avoid walking on wet floors. Keep items that you use a lot in easy-to-reach places. If you need to reach something above you, use a strong step stool that has a grab bar. Keep electrical cords out of the way. Do not use floor polish or wax that makes floors slippery. If you must use wax, use non-skid floor wax. Do not have throw rugs and other things on the floor that can make you trip. What can I do with my stairs? Do not leave any items on the stairs. Make sure that there are handrails on both sides of the stairs and use them. Fix handrails that are broken or loose. Make sure that handrails are as long as the stairways. Check any carpeting to make sure that it is firmly attached to the stairs. Fix any carpet that is loose or worn. Avoid having throw rugs at the top or bottom of the stairs. If you do have throw rugs, attach them to the floor with carpet tape.  Make sure that you have a light switch at the top of the stairs and the bottom of the stairs. If you do not have them, ask someone to add them for you. What else can I do to help prevent falls? Wear shoes that: Do not have high heels. Have rubber bottoms. Are comfortable and fit you well. Are closed at the toe. Do not wear sandals. If you use a stepladder: Make sure that it is fully opened. Do not climb a closed stepladder. Make sure that both sides of the stepladder are locked into place. Ask someone to hold it for you, if possible. Clearly mark and make sure that you can see: Any grab bars or handrails. First and last steps. Where the edge of each step is. Use tools that help you move around (mobility aids) if they are  needed. These include: Canes. Walkers. Scooters. Crutches. Turn on the lights when you go into a dark area. Replace any light bulbs as soon as they burn out. Set up your furniture so you have a clear path. Avoid moving your furniture around. If any of your floors are uneven, fix them. If there are any pets around you, be aware of where they are. Review your medicines with your doctor. Some medicines can make you feel dizzy. This can increase your chance of falling. Ask your doctor what other things that you can do to help prevent falls. This information is not intended to replace advice given to you by your health care provider. Make sure you discuss any questions you have with your health care provider. Document Released: 10/27/2008 Document Revised: 06/08/2015 Document Reviewed: 02/04/2014 Elsevier Interactive Patient Education  2017 [employer].

## 2022-01-01 NOTE — Progress Notes (Signed)
Virtual Visit via Telephone Note  I connected with  Dean White on 01/01/22 at 11:30 AM EST by telephone and verified that I am speaking with the correct person using two identifiers.  Location: Patient: home Provider: [facility] Persons participating in the virtual visit: patient/Nurse Health Advisor   I discussed the limitations, risks, security and privacy concerns of performing an evaluation and management service by telephone and the availability of in person appointments. The patient expressed understanding and agreed to proceed.  Interactive audio and video telecommunications were attempted between this nurse and patient, however failed, due to patient having technical difficulties OR patient did not have access to video capability.  We continued and completed visit with audio only.  Some vital signs may be absent or patient reported.   Dionisio David, LPN  Subjective:   Dean White is a 88 y.o. male who presents for Medicare Annual/Subsequent preventive examination.  Review of Systems     Cardiac Risk Factors include: advanced age (>64mn, >>67women)     Objective:    There were no vitals filed for this visit. There is no height or weight on file to calculate BMI.     01/01/2022   11:32 AM 12/28/2020   11:25 PM 06/20/2017    2:25 PM 09/10/2016   10:45 AM 04/11/2016    2:19 PM  Advanced Directives  Does Patient Have a Medical Advance Directive? No No Yes No;Yes Yes  Type of Advance Directive   Living will [place] will Living will  Does patient want to make changes to medical advance directive?   No - Patient declined    Copy of HLaduein Chart?    No - copy requested   Would patient like information on creating a medical advance directive? No - Patient declined No - Patient declined  No - Patient declined     Current Medications (verified) Outpatient Encounter Medications as of 01/01/2022  Medication Sig    cholecalciferol (VITAMIN D) 1000 UNITS tablet Take 1,000 Units by mouth daily.   latanoprost (XALATAN) 0.005 % ophthalmic solution place 1 drop into both eyes at bedtime   Leuprolide Acetate, 6 Month, (LUPRON) 45 MG injection Inject 45 mg into the muscle every 6 (six) months.   lovastatin (MEVACOR) 40 MG tablet TAKE 1 TABLET BY MOUTH DAILY   Misc Natural Products (PROSTATE HEALTH PO) Take by mouth.   traZODone (DESYREL) 50 MG tablet Take 0.5-1 tablets (25-50 mg total) by mouth at bedtime as needed for sleep.   Multiple Vitamin (MULTIVITAMIN) tablet Take 1 tablet by mouth daily. (Patient not taking: Reported on 01/01/2022)   No facility-administered encounter medications on file as of 01/01/2022.    Allergies (verified) Patient has no known allergies.   History: Past Medical History:  Diagnosis Date   [place]    BPH with obstruction/lower urinary tract symptoms    HLD (hyperlipidemia)    Paget disease of bone    Past Surgical History:  Procedure Laterality Date   CHOLECYSTECTOMY     PROSTATE BIOPSY     PROSTATE SURGERY     Family History  Problem Relation Age of Onset   Cancer Son        melanoma   Breast cancer Sister    Cancer Brother    Kidney disease Neg Hx    Prostate cancer Neg Hx    Social History   Socioeconomic History   Marital status: Divorced  Spouse name: Not on file   Number of children: Not on file   Years of education: Not on file   Highest education level: Not on file  Occupational History   Not on file  Tobacco Use   Smoking status: Former   Smokeless tobacco: Never   Tobacco comments:    quit 50 years ago  Substance and Sexual Activity   Alcohol use: No    Alcohol/week: 0.0 standard drinks of alcohol   Drug use: No   Sexual activity: Not on file  Other Topics Concern   Not on file  Social History Narrative   Not on file   Social Determinants of Health   Financial Resource Strain: Low Risk  (01/01/2022)    Overall Financial Resource Strain (CARDIA)    Difficulty of Paying Living Expenses: Not hard at all  Food Insecurity: No Food Insecurity (01/01/2022)   Hunger Vital Sign    Worried About Running Out of Food in the Last Year: Never true    [place] in the Last Year: Never true  Transportation Needs: No Transportation Needs (01/01/2022)   PRAPARE - Hydrologist (Medical): No    Lack of Transportation (Non-Medical): No  Physical Activity: Insufficiently Active (01/01/2022)   Exercise Vital Sign    Days of Exercise per Week: 3 days    Minutes of Exercise per Session: 30 min  Stress: No Stress Concern Present (01/01/2022)   [place]    Feeling of Stress : Not at all  Social Connections: Moderately Isolated (01/01/2022)   Social Connection and Isolation Panel [NHANES]    Frequency of Communication with Friends and Family: More than three times a week    Frequency of Social Gatherings with Friends and Family: Twice a week    Attends Religious Services: More than 4 times per year    Active Member of [employer] or Organizations: No    Attends Archivist Meetings: Never    Marital Status: Widowed    Tobacco Counseling Counseling given: Not Answered Tobacco comments: quit 50 years ago   Clinical Intake:  Pre-visit preparation completed: Yes  Pain : No/denies pain     Nutritional Risks: None Diabetes: No  How often do you need to have someone help you when you read instructions, pamphlets, or other written materials from your doctor or pharmacy?: 1 - Never  Diabetic?no  Interpreter Needed?: No  Information entered by :: Kirke Shaggy, LPN   Activities of Daily Living    01/01/2022   11:33 AM  In your present state of health, do you have any difficulty performing the following activities:  Hearing? 0  Vision? 0  Difficulty concentrating or making decisions? 0   Walking or climbing stairs? 0  Dressing or bathing? 0  Doing errands, shopping? 0  Preparing Food and eating ? N  Using the Toilet? N  In the past six months, have you accidently leaked urine? N  Do you have problems with loss of bowel control? N  Managing your Medications? N  Managing your Finances? N  Housekeeping or managing your Housekeeping? N    Patient Care Team: Cletis Athens, MD as PCP - General (Internal Medicine)  Indicate any recent Medical Services you may have received from other than [facility] providers in the past year (date may be approximate).     Assessment:   This is a routine wellness examination for Dean White.  Hearing/Vision  screen Hearing Screening - Comments:: No aids Vision Screening - Comments:: Glasses- [facility]  Dietary issues and exercise activities discussed: Current Exercise Habits: Home exercise routine, Type of exercise: walking, Time (Minutes): 30, Frequency (Times/Week): 3, Weekly Exercise (Minutes/Week): 90   Goals Addressed             This Visit's Progress    DIET - EAT MORE FRUITS AND VEGETABLES         Depression Screen    01/01/2022   11:30 AM 02/13/2021   11:35 AM 12/28/2020   11:19 PM 06/28/2020    9:50 AM 12/28/2019   11:32 AM  PHQ 2/9 Scores  PHQ - 2 Score 0 0 0 0 0  PHQ- 9 Score 0        Fall Risk    01/01/2022   11:32 AM 02/13/2021   11:35 AM 12/28/2020   11:26 PM 06/28/2020    9:50 AM 12/28/2019   11:32 AM  Fall Risk   Falls in the past year? 0 0 0 0   Number falls in past yr: 0 0 0 0 0  Injury with Fall? 0 0 0 0 0  Risk for fall due to : No Fall Risks No Fall Risks No Fall Risks No Fall Risks   Follow up Falls prevention discussed;Falls evaluation completed Falls evaluation completed Falls evaluation completed      FALL RISK PREVENTION PERTAINING TO THE HOME:  Any stairs in or around the home? No  If so, are there any without handrails? No  Home free of loose throw rugs in walkways, pet beds, electrical  cords, etc? Yes  Adequate lighting in your home to reduce risk of falls? Yes   ASSISTIVE DEVICES UTILIZED TO PREVENT FALLS:  Life alert? No  Use of a cane, walker or w/c? Yes  Grab bars in the bathroom? Yes  Shower chair or bench in shower? Yes  Elevated toilet seat or a handicapped toilet? No    Cognitive Function:    12/28/2020   11:28 PM  MMSE - Mini Mental State Exam  Not completed: Unable to complete        01/01/2022   11:34 AM 12/28/2020   11:28 PM  6CIT Screen  What Year? 0 points 0 points  What month? 0 points 0 points  What time? 0 points 0 points  Count back from 20 0 points 0 points  Months in reverse 0 points 0 points  Repeat phrase 4 points 0 points  Total Score 4 points 0 points    Immunizations Immunization History  Administered Date(s) Administered   Fluad Quad(high Dose 65+) 10/26/2019, 09/26/2020, 12/18/2021   PFIZER(Purple Top)SARS-COV-2 Vaccination 02/08/2019, 03/01/2019, 10/15/2019    TDAP status: Due, Education has been provided regarding the importance of this vaccine. Advised may receive this vaccine at local pharmacy or Health Dept. Aware to provide a copy of the vaccination record if obtained from local pharmacy or Health Dept. Verbalized acceptance and understanding.  Flu Vaccine status: Up to date  Pneumococcal vaccine status: Declined,  Education has been provided regarding the importance of this vaccine but patient still declined. Advised may receive this vaccine at local pharmacy or Health Dept. Aware to provide a copy of the vaccination record if obtained from local pharmacy or Health Dept. Verbalized acceptance and understanding.   Covid-19 vaccine status: Completed vaccines  Qualifies for Shingles Vaccine? Yes   Zostavax completed No   Shingrix Completed?: No.    Education has been provided  regarding the importance of this vaccine. Patient has been advised to call insurance company to determine out of pocket expense if they have  not yet received this vaccine. Advised may also receive vaccine at local pharmacy or Health Dept. Verbalized acceptance and understanding.  Screening Tests Health Maintenance  Topic Date Due   Pneumonia Vaccine 19+ Years old (1 - PCV) Never done   DTaP/Tdap/Td (1 - Tdap) Never done   Zoster Vaccines- Shingrix (1 of 2) Never done   COVID-19 Vaccine (4 - 2023-24 season) 09/14/2021   Medicare Annual Wellness (AWV)  01/02/2023   INFLUENZA VACCINE  Completed   HPV VACCINES  Aged Out    Health Maintenance  Health Maintenance Due  Topic Date Due   Pneumonia Vaccine 65+ Years old (1 - PCV) Never done   DTaP/Tdap/Td (1 - Tdap) Never done   Zoster Vaccines- Shingrix (1 of 2) Never done   COVID-19 Vaccine (4 - 2023-24 season) 09/14/2021    Colorectal cancer screening: No longer required.   Lung Cancer Screening: (Low Dose CT Chest recommended if Age 51-80 years, 30 pack-year currently smoking OR have quit w/in 15years.) does not qualify.    Additional Screening:  Hepatitis C Screening: does not qualify; Completed no  Vision Screening: Recommended annual ophthalmology exams for early detection of glaucoma and other disorders of the eye. Is the patient up to date with their annual eye exam?  Yes  Who is the provider or what is the name of the office in which the patient attends annual eye exams? [place] If pt is not established with a provider, would they like to be referred to a provider to establish care? No .   Dental Screening: Recommended annual dental exams for proper oral hygiene  Community Resource Referral / Chronic Care Management: CRR required this visit?  No   CCM required this visit?  No      Plan:     I have personally reviewed and noted the following in the patient's chart:   Medical and social history Use of alcohol, tobacco or illicit drugs  Current medications and supplements including opioid prescriptions. Patient is not currently taking opioid  prescriptions. Functional ability and status Nutritional status Physical activity Advanced directives List of other physicians Hospitalizations, surgeries, and ER visits in previous 12 months Vitals Screenings to include cognitive, depression, and falls Referrals and appointments  In addition, I have reviewed and discussed with patient certain preventive protocols, quality metrics, and best practice recommendations. A written personalized care plan for preventive services as well as general preventive health recommendations were provided to patient.     Dionisio David, LPN   [id number]   Nurse Notes: none

## 2022-03-14 ENCOUNTER — Encounter: Payer: Self-pay | Admitting: Radiology

## 2022-04-01 ENCOUNTER — Ambulatory Visit: Payer: Medicare (Managed Care) | Admitting: Internal Medicine

## 2022-05-15 ENCOUNTER — Ambulatory Visit: Payer: Medicare (Managed Care) | Admitting: Urology

## 2022-05-21 NOTE — Progress Notes (Deleted)
10/30/2021 10:36 AM   Dean White 08/26/1928 [id number]  Referring provider: Corky Downs, MD [address] [place],  [place] [address]  Urological history: 1. 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 I am skeptical of superimposed metastatic disease in these regions -receiving ADT (last injection 10/2021)  Chief Complaint  Patient presents with   Prostate Cancer    HPI: Dean White is a 89 y.o. male who presents today for 6 month follow  up.   I PSS ***    Score:  1-7 Mild 8-19 Moderate 20-35 Severe     PMH: Past Medical History:  Diagnosis Date   Adenocarcinoma of prostate (HCC)    BPH with obstruction/lower urinary tract symptoms    HLD (hyperlipidemia)    Paget disease of bone     Surgical History: Past Surgical History:  Procedure Laterality Date   CHOLECYSTECTOMY     PROSTATE BIOPSY     PROSTATE SURGERY      Home Medications:  Allergies as of 10/30/2021   No Known Allergies      Medication List        Accurate as of October 30, 2021 10:36 AM. If you have any questions, ask your nurse or doctor.          cholecalciferol 1000 units tablet Commonly known as: VITAMIN D Take 1,000 Units by mouth daily.   latanoprost 0.005 % ophthalmic solution Commonly known as: XALATAN place 1 drop into both eyes at bedtime   Leuprolide Acetate (6 Month) 45 MG injection Commonly known as: LUPRON Inject 45 mg into the muscle every 6 (six) months.   lovastatin 40 MG  tablet Commonly known as: MEVACOR TAKE 1 TABLET BY MOUTH DAILY   multivitamin tablet Take 1 tablet by mouth daily.   PROSTATE HEALTH PO Take by mouth.   traZODone 50 MG tablet Commonly known as: DESYREL Take 0.5-1 tablets (25-50 mg total) by mouth at bedtime as needed for sleep.        Allergies: No Known Allergies  Family History: Family History  Problem Relation Age of Onset   Cancer Son        melanoma   Breast cancer Sister    Cancer Brother    Kidney disease Neg Hx    Prostate cancer Neg Hx     Social History:  reports that he has quit smoking. He has never used smokeless tobacco. He reports that he does not drink alcohol and does not use drugs.  ROS: Pertinent ROS in HPI  Physical Exam: BP 130/70   Pulse 74   Ht 5\' 7"  (1.702 m)   Wt 155 lb (70.3 kg)   BMI 24.28 kg/m   Constitutional:  Well nourished. Alert and oriented, No acute distress. HEENT: [place] AT, moist mucus membranes.  Trachea midline Cardiovascular: No clubbing, cyanosis, or edema. Respiratory: Normal respiratory effort, no increased work of breathing. GU: No CVA tenderness.  No bladder fullness or masses.  Patient with circumcised/uncircumcised phallus. ***Foreskin easily retracted***  Urethral meatus is  patent.  No penile discharge. No penile lesions or rashes. Scrotum without lesions, cysts, rashes and/or edema.  Testicles are located scrotally bilaterally. No masses are appreciated in the testicles. Left and right epididymis are normal. Rectal: Patient with  normal sphincter tone. Anus and perineum without scarring or rashes. No rectal masses are appreciated. Prostate is approximately *** grams, *** nodules are appreciated. Seminal vesicles are normal. Neurologic: Grossly intact, no focal deficits, moving all 4 extremities. Psychiatric: Normal mood and affect.   Laboratory Data: PSA pending   Pertinent Imaging: N/A  Assessment & Plan:    1. Prostate cancer -PSA pending  -We discussed  that due to his advanced age, if he wanted to continue with the ADT at this time -He stated that he would as he feels well with good appetite and the hot flashes are very mild and not bothersome -6 month Eligard given today   Return in about 6 months (around 05/01/2022) for PSA and Eligard shot .  These notes generated with voice recognition software. I apologize for typographical errors.  Cloretta Ned  [facility] [address]  [address] [place], [place] [address] [phone]

## 2022-05-22 ENCOUNTER — Ambulatory Visit: Payer: Medicare (Managed Care) | Admitting: Urology

## 2022-05-22 DIAGNOSIS — C61 Malignant neoplasm of prostate: Secondary | ICD-10-CM

## 2022-06-05 NOTE — Progress Notes (Signed)
06/06/2022 12:55 PM   Dean White [birth year] [id number]  Referring provider: Corky Downs, MD [address] [place],  [place] [address]  Urological history: 1. 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 I am skeptical of superimposed metastatic disease in these regions -receiving ADT (last injection 10/2021)  Chief Complaint  Patient presents with   Follow-up    Eligard     HPI: Dean White is a 89 y.o. male who presents today for 6 month follow  up.   I PSS 2/3  He has no complaints at this time.  Patient denies any modifying or aggravating factors.  Patient denies any gross hematuria, dysuria or suprapubic/flank pain.  Patient denies any fevers, chills, nausea or vomiting.     IPSS     Row Name 06/06/22 1100         International Prostate Symptom Score   How often have you had the sensation of not emptying your bladder? Not at All     How often have you had to urinate less than every two hours? Not at All     How often have you found you stopped and started again several times when you urinated? Not at All     How often have you found it difficult to postpone urination? Not at All     How often have you had a weak urinary stream? Less than half the time     How often have you had to strain to start urination? Not at All     How many times did you typically get up at night to urinate? None     Total IPSS Score 2       Quality of Life due to urinary symptoms   If you were to spend the rest  of your life with your urinary condition just the way it is now how would you feel about that? Mixed              Score:  1-7 Mild 8-19 Moderate 20-35 Severe     PMH: Past Medical History:  Diagnosis Date   Adenocarcinoma of prostate (HCC)    BPH with obstruction/lower urinary tract symptoms    HLD (hyperlipidemia)    Paget disease of bone     Surgical History: Past Surgical History:  Procedure Laterality Date   CHOLECYSTECTOMY     PROSTATE BIOPSY     PROSTATE SURGERY      Home Medications:  Allergies as of 06/06/2022   No Known Allergies      Medication List        Accurate as of Jun 06, 2022 12:55 PM. If you have any questions, ask your nurse or doctor.          STOP taking these medications    multivitamin tablet       TAKE these medications    cholecalciferol 1000 units tablet Commonly known as: VITAMIN D Take 1,000 Units by mouth daily.   latanoprost 0.005 % ophthalmic solution Commonly known as:  XALATAN place 1 drop into both eyes at bedtime   Leuprolide Acetate (6 Month) 45 MG injection Commonly known as: LUPRON Inject 45 mg into the muscle every 6 (six) months.   lovastatin 40 MG tablet Commonly known as: MEVACOR TAKE 1 TABLET BY MOUTH DAILY   PROSTATE HEALTH PO Take by mouth.   traZODone 50 MG tablet Commonly known as: DESYREL Take 0.5-1 tablets (25-50 mg total) by mouth at bedtime as needed for sleep.        Allergies: No Known Allergies  Family History: Family History  Problem Relation Age of Onset   Cancer Son        melanoma   Breast cancer Sister    Cancer Brother    Kidney disease Neg Hx    Prostate cancer Neg Hx     Social History:  reports that he has quit smoking. He has never used smokeless tobacco. He reports that he does not drink alcohol and does not use drugs.  ROS: Pertinent ROS in HPI  Physical Exam: BP 114/70   Pulse 68   Ht 5\' 7"  (1.702 m)   Wt 147 lb (66.7 kg)   BMI 23.02 kg/m    Constitutional:  Well nourished. Alert and oriented, No acute distress. HEENT: Dean White AT, moist mucus membranes.  Trachea midline Cardiovascular: No clubbing, cyanosis, or edema. Respiratory: Normal respiratory effort, no increased work of breathing. Neurologic: Grossly intact, no focal deficits, moving all 4 extremities. Psychiatric: Normal mood and affect.   Laboratory Data: PSA pending   Pertinent Imaging: N/A  Assessment & Plan:    1. Prostate cancer -PSA pending  -he would like to continue the ADT -Eligard given today  Return in about 6 months (around 12/07/2022) for PSA, Eligard.  These notes generated with voice recognition software. I apologize for typographical errors.  Dean White  [facility] [address]  [address] [place], [place] [address] [phone]

## 2022-06-06 ENCOUNTER — Encounter: Payer: Self-pay | Admitting: Urology

## 2022-06-06 ENCOUNTER — Ambulatory Visit (INDEPENDENT_AMBULATORY_CARE_PROVIDER_SITE_OTHER): Payer: Medicare Other | Admitting: Urology

## 2022-06-06 VITALS — BP 114/70 | HR 68 | Ht 67.0 in | Wt 147.0 lb

## 2022-06-06 DIAGNOSIS — C61 Malignant neoplasm of prostate: Secondary | ICD-10-CM | POA: Diagnosis not present

## 2022-06-06 MED ORDER — LEUPROLIDE ACETATE (6 MONTH) 45 MG ~~LOC~~ KIT
45.0000 mg | PACK | Freq: Once | SUBCUTANEOUS | Status: AC
Start: 2022-06-06 — End: 2022-06-06
  Administered 2022-06-06: 45 mg via SUBCUTANEOUS

## 2022-06-06 NOTE — Progress Notes (Signed)
Eligard SubQ Injection   Due to Prostate Cancer patient is present today for a Eligard Injection.  Medication: Eligard 6 month Dose: 45 mg  Location: right RUOQ Lot: [id number] Exp: 09/15/2023  Patient tolerated well, no complications were noted  Performed by: Randa Lynn, RMa  Per Michiel Cowboy, PA  patient is to continue therapy for Life . Patient's next follow up was scheduled for 12/03/2022. This appointment was scheduled using wheel and given to patient today along with reminder continue on Vitamin D 800-1000iu and Calcium 1000-1200mg  daily while on Androgen Deprivation Therapy.  PA approval dates:

## 2022-06-07 LAB — PSA: Prostate Specific Ag, Serum: 0.2 ng/mL (ref 0.0–4.0)

## 2022-09-20 ENCOUNTER — Other Ambulatory Visit: Payer: Self-pay | Admitting: Internal Medicine

## 2022-09-20 DIAGNOSIS — R748 Abnormal levels of other serum enzymes: Secondary | ICD-10-CM

## 2022-10-03 ENCOUNTER — Ambulatory Visit
Admission: RE | Admit: 2022-10-03 | Discharge: 2022-10-03 | Disposition: A | Discharge status: Home / Self Care | Payer: Medicare Other | Source: Ambulatory Visit | Attending: Internal Medicine | Admitting: Internal Medicine

## 2022-10-03 DIAGNOSIS — R748 Abnormal levels of other serum enzymes: Secondary | ICD-10-CM | POA: Diagnosis present

## 2022-11-29 ENCOUNTER — Ambulatory Visit: Payer: Medicare (Managed Care) | Admitting: Urology

## 2022-11-29 ENCOUNTER — Telehealth: Payer: Self-pay

## 2022-11-29 NOTE — Telephone Encounter (Signed)
Called [employer] obtained PA for Eligard  Dates: 12/03/22 - 12/03/23  Authg #[id number]

## 2022-11-29 NOTE — Progress Notes (Unsigned)
12/03/2022 12:06 PM   Dean White [birth year] [id number]  Referring provider: Corky Downs, MD [address] [place],  [place] [address]  Urological history: 1. 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 I am skeptical of superimposed metastatic disease in these regions -receiving ADT (last injection 11/2022)  Chief Complaint  Patient presents with   Follow-up   HPI: Dean White is a 89 y.o. male who presents today for 6 month follow  up.   Previous records reviewed.   He has been having issues with maintaining weight.  He states his appetite is somewhat diminished.  He does not have any episodes of nausea, he just does not feel hungry sometimes.  He is not having any pain.  He denies any hot flashes.  Patient denies any modifying or aggravating factors.  Patient denies any recent UTI's, gross hematuria, dysuria or suprapubic/flank pain.  Patient denies any fevers, chills, nausea or vomiting.    PMH: Past Medical History:  Diagnosis Date   Adenocarcinoma of prostate (HCC)    BPH with obstruction/lower urinary tract symptoms    HLD (hyperlipidemia)    Paget disease of bone     Surgical History: Past Surgical History:  Procedure Laterality Date   CHOLECYSTECTOMY     PROSTATE BIOPSY     PROSTATE SURGERY      Home Medications:  Allergies as of 12/03/2022   No Known Allergies      Medication List        Accurate as of December 03, 2022 12:06 PM. If you have any questions, ask your  nurse or doctor.          cholecalciferol 1000 units tablet Commonly known as: VITAMIN D Take 1,000 Units by mouth daily.   latanoprost 0.005 % ophthalmic solution Commonly known as: XALATAN place 1 drop into both eyes at bedtime   Leuprolide Acetate (6 Month) 45 MG injection Commonly known as: LUPRON Inject 45 mg into the muscle every 6 (six) months.   lovastatin 40 MG tablet Commonly known as: MEVACOR TAKE 1 TABLET BY MOUTH DAILY   PROSTATE HEALTH PO Take by mouth.   traZODone 50 MG tablet Commonly known as: DESYREL Take 0.5-1 tablets (25-50 mg total) by mouth at bedtime as needed for sleep.        Allergies: No Known Allergies  Family History: Family History  Problem Relation Age of Onset   Cancer Son        melanoma   Breast cancer Sister    Cancer Brother    Kidney disease Neg Hx    Prostate cancer Neg Hx     Social History:  reports that he has quit smoking. He has never used smokeless tobacco. He reports that he does not drink alcohol and does not use drugs.  ROS: Pertinent ROS in HPI  Physical Exam: BP 100/65   Pulse 71   Constitutional:  Well nourished. Alert and oriented, No acute distress. HEENT: [place] AT, moist mucus membranes.  Trachea midline Cardiovascular: No clubbing, cyanosis,  or edema. Respiratory: Normal respiratory effort, no increased work of breathing. Neurologic: Grossly intact, no focal deficits, moving all 4 extremities. Psychiatric: Normal mood and affect.   Laboratory Data: PSA pending   Pertinent Imaging: N/A  Assessment & Plan:    1. Prostate cancer -PSA pending  -Discussed goals of care -he would like to continue the ADT -Eligard given today  Return in about 6 months (around 06/02/2023) for PSA, Eligard.  These notes generated with voice recognition software. I apologize for typographical errors.  Cloretta Ned  [facility] [address]  [address] [place], [place]  [address] [phone]

## 2022-12-03 ENCOUNTER — Encounter: Payer: Self-pay | Admitting: Urology

## 2022-12-03 ENCOUNTER — Ambulatory Visit: Payer: Medicare (Managed Care) | Admitting: Urology

## 2022-12-03 VITALS — BP 100/65 | HR 71

## 2022-12-03 DIAGNOSIS — C61 Malignant neoplasm of prostate: Secondary | ICD-10-CM

## 2022-12-03 MED ORDER — LEUPROLIDE ACETATE (6 MONTH) 45 MG ~~LOC~~ KIT
45.0000 mg | PACK | Freq: Once | SUBCUTANEOUS | Status: AC
Start: 2022-12-03 — End: 2022-12-03
  Administered 2022-12-03: 45 mg via SUBCUTANEOUS

## 2022-12-03 NOTE — Progress Notes (Signed)
Eligard SubQ Injection   Due to Prostate Cancer patient is present today for a Eligard Injection.  Medication: Eligard 6 month Dose: 45 mg  Location: left UOQ Lot: [id number] Exp: 03/14/2024  Patient tolerated well, no complications were noted  Performed by: Vale Haven Magallon-Mariche,RMA  Per Michiel Cowboy, PA patient is to continue therapy for for life . Patient's next follow up was scheduled for 06/03/2023. This appointment was scheduled using wheel and given to patient today along with reminder continue on Vitamin D 800-1000iu and Calcium 1000-1200mg  daily while on Androgen Deprivation Therapy.  PA approval dates:

## 2022-12-04 LAB — PSA: Prostate Specific Ag, Serum: 0.2 ng/mL (ref 0.0–4.0)

## 2023-04-12 ENCOUNTER — Other Ambulatory Visit: Payer: Self-pay

## 2023-04-12 ENCOUNTER — Inpatient Hospital Stay: Payer: Medicare (Managed Care)

## 2023-04-12 ENCOUNTER — Inpatient Hospital Stay
Admission: EM | Admit: 2023-04-12 | Discharge: 2023-05-15 | DRG: 871 | Disposition: E | Payer: Medicare (Managed Care) | Attending: Student | Admitting: Student

## 2023-04-12 ENCOUNTER — Emergency Department: Payer: Medicare (Managed Care)

## 2023-04-12 DIAGNOSIS — L89302 Pressure ulcer of unspecified buttock, stage 2: Secondary | ICD-10-CM | POA: Diagnosis present

## 2023-04-12 DIAGNOSIS — Z515 Encounter for palliative care: Secondary | ICD-10-CM

## 2023-04-12 DIAGNOSIS — C61 Malignant neoplasm of prostate: Secondary | ICD-10-CM | POA: Diagnosis present

## 2023-04-12 DIAGNOSIS — L8996 Pressure-induced deep tissue damage of unspecified site: Secondary | ICD-10-CM | POA: Insufficient documentation

## 2023-04-12 DIAGNOSIS — A419 Sepsis, unspecified organism: Secondary | ICD-10-CM | POA: Diagnosis not present

## 2023-04-12 DIAGNOSIS — Z7189 Other specified counseling: Secondary | ICD-10-CM | POA: Diagnosis not present

## 2023-04-12 DIAGNOSIS — E440 Moderate protein-calorie malnutrition: Secondary | ICD-10-CM | POA: Insufficient documentation

## 2023-04-12 DIAGNOSIS — G928 Other toxic encephalopathy: Secondary | ICD-10-CM | POA: Diagnosis present

## 2023-04-12 DIAGNOSIS — E860 Dehydration: Secondary | ICD-10-CM | POA: Diagnosis present

## 2023-04-12 DIAGNOSIS — E872 Acidosis, unspecified: Secondary | ICD-10-CM | POA: Diagnosis present

## 2023-04-12 DIAGNOSIS — J9601 Acute respiratory failure with hypoxia: Secondary | ICD-10-CM | POA: Diagnosis present

## 2023-04-12 DIAGNOSIS — J189 Pneumonia, unspecified organism: Secondary | ICD-10-CM

## 2023-04-12 DIAGNOSIS — Z886 Allergy status to analgesic agent status: Secondary | ICD-10-CM

## 2023-04-12 DIAGNOSIS — E871 Hypo-osmolality and hyponatremia: Secondary | ICD-10-CM | POA: Diagnosis present

## 2023-04-12 DIAGNOSIS — I10 Essential (primary) hypertension: Secondary | ICD-10-CM | POA: Diagnosis present

## 2023-04-12 DIAGNOSIS — Y93E1 Activity, personal bathing and showering: Secondary | ICD-10-CM | POA: Diagnosis not present

## 2023-04-12 DIAGNOSIS — I471 Supraventricular tachycardia, unspecified: Secondary | ICD-10-CM | POA: Diagnosis present

## 2023-04-12 DIAGNOSIS — Z6825 Body mass index (BMI) 25.0-25.9, adult: Secondary | ICD-10-CM

## 2023-04-12 DIAGNOSIS — Z1152 Encounter for screening for COVID-19: Secondary | ICD-10-CM | POA: Diagnosis not present

## 2023-04-12 DIAGNOSIS — E878 Other disorders of electrolyte and fluid balance, not elsewhere classified: Secondary | ICD-10-CM | POA: Diagnosis present

## 2023-04-12 DIAGNOSIS — R748 Abnormal levels of other serum enzymes: Secondary | ICD-10-CM | POA: Diagnosis not present

## 2023-04-12 DIAGNOSIS — L03317 Cellulitis of buttock: Secondary | ICD-10-CM

## 2023-04-12 DIAGNOSIS — G8929 Other chronic pain: Secondary | ICD-10-CM | POA: Diagnosis present

## 2023-04-12 DIAGNOSIS — X31XXXA Exposure to excessive natural cold, initial encounter: Secondary | ICD-10-CM | POA: Diagnosis not present

## 2023-04-12 DIAGNOSIS — T68XXXA Hypothermia, initial encounter: Secondary | ICD-10-CM | POA: Diagnosis present

## 2023-04-12 DIAGNOSIS — R571 Hypovolemic shock: Secondary | ICD-10-CM | POA: Diagnosis not present

## 2023-04-12 DIAGNOSIS — R55 Syncope and collapse: Secondary | ICD-10-CM | POA: Diagnosis not present

## 2023-04-12 DIAGNOSIS — M6282 Rhabdomyolysis: Secondary | ICD-10-CM | POA: Diagnosis present

## 2023-04-12 DIAGNOSIS — J690 Pneumonitis due to inhalation of food and vomit: Secondary | ICD-10-CM | POA: Diagnosis present

## 2023-04-12 DIAGNOSIS — E861 Hypovolemia: Secondary | ICD-10-CM | POA: Diagnosis present

## 2023-04-12 DIAGNOSIS — E785 Hyperlipidemia, unspecified: Secondary | ICD-10-CM | POA: Diagnosis present

## 2023-04-12 DIAGNOSIS — L89210 Pressure ulcer of right hip, unstageable: Secondary | ICD-10-CM | POA: Diagnosis present

## 2023-04-12 DIAGNOSIS — R6520 Severe sepsis without septic shock: Secondary | ICD-10-CM | POA: Diagnosis present

## 2023-04-12 DIAGNOSIS — E162 Hypoglycemia, unspecified: Secondary | ICD-10-CM | POA: Diagnosis not present

## 2023-04-12 DIAGNOSIS — W182XXA Fall in (into) shower or empty bathtub, initial encounter: Secondary | ICD-10-CM | POA: Diagnosis present

## 2023-04-12 DIAGNOSIS — N401 Enlarged prostate with lower urinary tract symptoms: Secondary | ICD-10-CM | POA: Diagnosis present

## 2023-04-12 DIAGNOSIS — Z87891 Personal history of nicotine dependence: Secondary | ICD-10-CM | POA: Diagnosis not present

## 2023-04-12 DIAGNOSIS — R4182 Altered mental status, unspecified: Secondary | ICD-10-CM | POA: Diagnosis present

## 2023-04-12 DIAGNOSIS — J9690 Respiratory failure, unspecified, unspecified whether with hypoxia or hypercapnia: Secondary | ICD-10-CM | POA: Diagnosis present

## 2023-04-12 DIAGNOSIS — Z79899 Other long term (current) drug therapy: Secondary | ICD-10-CM

## 2023-04-12 DIAGNOSIS — R7989 Other specified abnormal findings of blood chemistry: Secondary | ICD-10-CM

## 2023-04-12 DIAGNOSIS — G9341 Metabolic encephalopathy: Secondary | ICD-10-CM

## 2023-04-12 DIAGNOSIS — Z789 Other specified health status: Secondary | ICD-10-CM | POA: Diagnosis not present

## 2023-04-12 DIAGNOSIS — H5702 Anisocoria: Secondary | ICD-10-CM | POA: Diagnosis present

## 2023-04-12 DIAGNOSIS — M889 Osteitis deformans of unspecified bone: Secondary | ICD-10-CM | POA: Diagnosis present

## 2023-04-12 DIAGNOSIS — R410 Disorientation, unspecified: Secondary | ICD-10-CM | POA: Diagnosis not present

## 2023-04-12 DIAGNOSIS — Z66 Do not resuscitate: Secondary | ICD-10-CM | POA: Diagnosis present

## 2023-04-12 DIAGNOSIS — S31809A Unspecified open wound of unspecified buttock, initial encounter: Secondary | ICD-10-CM

## 2023-04-12 DIAGNOSIS — Z8546 Personal history of malignant neoplasm of prostate: Secondary | ICD-10-CM

## 2023-04-12 LAB — CBC WITH DIFFERENTIAL/PLATELET
Abs Immature Granulocytes: 0.05 10*3/uL (ref 0.00–0.07)
Basophils Absolute: 0 10*3/uL (ref 0.0–0.1)
Basophils Relative: 1 %
Eosinophils Absolute: 0 10*3/uL (ref 0.0–0.5)
Eosinophils Relative: 0 %
HCT: 47.1 % (ref 39.0–52.0)
Hemoglobin: 15.7 g/dL (ref 13.0–17.0)
Immature Granulocytes: 1 %
Lymphocytes Relative: 5 %
Lymphs Abs: 0.3 10*3/uL — ABNORMAL LOW (ref 0.7–4.0)
MCH: 30.4 pg (ref 26.0–34.0)
MCHC: 33.3 g/dL (ref 30.0–36.0)
MCV: 91.3 fL (ref 80.0–100.0)
Monocytes Absolute: 0.3 10*3/uL (ref 0.1–1.0)
Monocytes Relative: 6 %
Neutro Abs: 4.7 10*3/uL (ref 1.7–7.7)
Neutrophils Relative %: 87 %
Platelets: 189 10*3/uL (ref 150–400)
RBC: 5.16 MIL/uL (ref 4.22–5.81)
RDW: 12.5 % (ref 11.5–15.5)
Smear Review: NORMAL
WBC: 5.4 10*3/uL (ref 4.0–10.5)
nRBC: 0 % (ref 0.0–0.2)

## 2023-04-12 LAB — COMPREHENSIVE METABOLIC PANEL WITH GFR
ALT: 85 U/L — ABNORMAL HIGH (ref 0–44)
AST: 111 U/L — ABNORMAL HIGH (ref 15–41)
Albumin: 2.6 g/dL — ABNORMAL LOW (ref 3.5–5.0)
Alkaline Phosphatase: 52 U/L (ref 38–126)
Anion gap: 13 (ref 5–15)
BUN: 47 mg/dL — ABNORMAL HIGH (ref 8–23)
CO2: 20 mmol/L — ABNORMAL LOW (ref 22–32)
Calcium: 8.2 mg/dL — ABNORMAL LOW (ref 8.9–10.3)
Chloride: 96 mmol/L — ABNORMAL LOW (ref 98–111)
Creatinine, Ser: 0.7 mg/dL (ref 0.61–1.24)
GFR, Estimated: >60 mL/min (ref >60)
Glucose, Bld: 86 mg/dL (ref 70–99)
Potassium: 3.2 mmol/L — ABNORMAL LOW (ref 3.5–5.1)
Sodium: 129 mmol/L — ABNORMAL LOW (ref 135–145)
Total Bilirubin: 1.3 mg/dL — ABNORMAL HIGH (ref 0.0–1.2)
Total Protein: 4.8 g/dL — ABNORMAL LOW (ref 6.5–8.1)

## 2023-04-12 LAB — PROCALCITONIN: Procalcitonin: 0.25 ng/mL

## 2023-04-12 LAB — URINALYSIS, W/ REFLEX TO CULTURE (INFECTION SUSPECTED)
Bilirubin Urine: NEGATIVE
Glucose, UA: NEGATIVE mg/dL
Ketones, ur: 5 mg/dL — AB
Leukocytes,Ua: NEGATIVE
Nitrite: NEGATIVE
Protein, ur: NEGATIVE mg/dL
Specific Gravity, Urine: 1.019 (ref 1.005–1.030)
Squamous Epithelial / HPF: 0 /HPF (ref 0–5)
pH: 5 (ref 5.0–8.0)

## 2023-04-12 LAB — URINE DRUG SCREEN, QUALITATIVE (ARMC ONLY)
Amphetamines, Ur Screen: NOT DETECTED
Barbiturates, Ur Screen: NOT DETECTED
Benzodiazepine, Ur Scrn: NOT DETECTED
Cannabinoid 50 Ng, Ur ~~LOC~~: NOT DETECTED
Cocaine Metabolite,Ur ~~LOC~~: NOT DETECTED
MDMA (Ecstasy)Ur Screen: NOT DETECTED
Methadone Scn, Ur: NOT DETECTED
Opiate, Ur Screen: NOT DETECTED
Phencyclidine (PCP) Ur S: NOT DETECTED
Tricyclic, Ur Screen: NOT DETECTED

## 2023-04-12 LAB — PROTIME-INR
INR: 1.6 — ABNORMAL HIGH (ref 0.8–1.2)
Prothrombin Time: 19 s — ABNORMAL HIGH (ref 11.4–15.2)

## 2023-04-12 LAB — TROPONIN I (HIGH SENSITIVITY)
Troponin I (High Sensitivity): 14 ng/L (ref <18)
Troponin I (High Sensitivity): 12 ng/L (ref <18)

## 2023-04-12 LAB — CK: Total CK: 1345 U/L — ABNORMAL HIGH (ref 49–397)

## 2023-04-12 LAB — APTT: aPTT: 36 s (ref 24–36)

## 2023-04-12 LAB — LACTIC ACID, PLASMA
Lactic Acid, Venous: 3.6 mmol/L (ref 0.5–1.9)
Lactic Acid, Venous: 2.6 mmol/L (ref 0.5–1.9)

## 2023-04-12 MED ORDER — VANCOMYCIN HCL 500 MG/100ML IV SOLN
500.0000 mg | Freq: Once | INTRAVENOUS | Status: AC
  Administered 2023-04-13: 500 mg via INTRAVENOUS
  Filled 2023-04-12: qty 100

## 2023-04-12 MED ORDER — HALOPERIDOL LACTATE 5 MG/ML IJ SOLN
0.5000 mg | Freq: Four times a day (QID) | INTRAMUSCULAR | Status: AC | PRN
  Administered 2023-04-13 (×2): 0.5 mg via INTRAVENOUS
  Filled 2023-04-12 (×2): qty 1

## 2023-04-12 MED ORDER — LACTATED RINGERS IV SOLN
150.0000 mL/h | INTRAVENOUS | Status: DC
  Administered 2023-04-12: 150 mL/h via INTRAVENOUS

## 2023-04-12 MED ORDER — SODIUM CHLORIDE 0.9 % IV SOLN
2.0000 g | Freq: Once | INTRAVENOUS | Status: AC
  Administered 2023-04-12: 2 g via INTRAVENOUS
  Filled 2023-04-12: qty 12.5

## 2023-04-12 MED ORDER — ACETAMINOPHEN 650 MG RE SUPP: 650.0000 mg | Freq: Four times a day (QID) | RECTAL | Status: DC | PRN

## 2023-04-12 MED ORDER — LACTATED RINGERS IV BOLUS
1000.0000 mL | Freq: Once | INTRAVENOUS | Status: AC
  Administered 2023-04-12: 1000 mL via INTRAVENOUS

## 2023-04-12 MED ORDER — SODIUM CHLORIDE 0.9 % IV BOLUS
1000.0000 mL | Freq: Once | INTRAVENOUS | Status: AC
  Administered 2023-04-12: 1000 mL via INTRAVENOUS

## 2023-04-12 MED ORDER — ACETAMINOPHEN 325 MG PO TABS: 650.0000 mg | ORAL_TABLET | Freq: Four times a day (QID) | ORAL | Status: DC | PRN

## 2023-04-12 MED ORDER — ENOXAPARIN SODIUM 40 MG/0.4ML IJ SOSY
40.0000 mg | PREFILLED_SYRINGE | INTRAMUSCULAR | Status: DC
  Administered 2023-04-13: 40 mg via SUBCUTANEOUS
  Filled 2023-04-12: qty 0.4

## 2023-04-12 MED ORDER — VANCOMYCIN HCL IN DEXTROSE 1-5 GM/200ML-% IV SOLN
1000.0000 mg | Freq: Once | INTRAVENOUS | Status: AC
  Administered 2023-04-12: 1000 mg via INTRAVENOUS
  Filled 2023-04-12: qty 200

## 2023-04-12 MED ORDER — HYDROCODONE-ACETAMINOPHEN 5-325 MG PO TABS
1.0000 | ORAL_TABLET | ORAL | Status: DC | PRN
  Administered 2023-04-15: 1 via ORAL
  Filled 2023-04-12: qty 1

## 2023-04-12 MED ORDER — ONDANSETRON HCL 4 MG/2ML IJ SOLN: 4.0000 mg | Freq: Four times a day (QID) | INTRAMUSCULAR | Status: DC | PRN

## 2023-04-12 MED ORDER — SODIUM CHLORIDE 0.9 % IV SOLN
2.0000 g | Freq: Two times a day (BID) | INTRAVENOUS | Status: DC
  Administered 2023-04-13 – 2023-04-14 (×3): 2 g via INTRAVENOUS
  Filled 2023-04-12 (×4): qty 12.5

## 2023-04-12 MED ORDER — ALBUTEROL SULFATE (2.5 MG/3ML) 0.083% IN NEBU: 2.5000 mg | INHALATION_SOLUTION | RESPIRATORY_TRACT | Status: DC | PRN

## 2023-04-12 MED ORDER — METRONIDAZOLE 500 MG/100ML IV SOLN
500.0000 mg | Freq: Two times a day (BID) | INTRAVENOUS | Status: DC
  Administered 2023-04-12 – 2023-04-13 (×3): 500 mg via INTRAVENOUS
  Filled 2023-04-12 (×4): qty 100

## 2023-04-12 MED ORDER — VANCOMYCIN HCL 1250 MG/250ML IV SOLN
1250.0000 mg | INTRAVENOUS | Status: DC
  Administered 2023-04-13 – 2023-04-15 (×3): 1250 mg via INTRAVENOUS
  Filled 2023-04-12 (×5): qty 250

## 2023-04-12 MED ORDER — ONDANSETRON HCL 4 MG PO TABS: 4.0000 mg | ORAL_TABLET | Freq: Four times a day (QID) | ORAL | Status: DC | PRN

## 2023-04-12 NOTE — Progress Notes (Signed)
 Pharmacy Antibiotic Note  Dean White is a 89 y.o. male admitted on 04/12/2023 with sepsis.  Pharmacy has been consulted for Vanc, Cefepime dosing.  Plan: Cefepime 2 gm IV X 1 given in ED on 3/29 @ 1921. Cefepime 2 gm IV Q12H ordered to start on 3/30 @ 0700.  Vancomycin 1 gm IV X 1 given in ED on 3/29 @ 2006.  Additional Vanc 500 mg IV X 1 ordered to make total loading dose of 1500 mg.  Vancomycin 1250 mg IV Q24H ordered to start on 3/30 @ 2000.  AUC = 528.6 Vanc trough = 12.3   Weight: 65.8 kg (145 lb)  Temp (24hrs), Avg:89.2 F (31.8 C), Min:86.6 F (30.3 C), Max:92.9 F (33.8 C)  Recent Labs  Lab 04/12/23 1745 04/12/23 1939  WBC 5.4  --   CREATININE 0.70  --   LATICACIDVEN 3.6* 2.6*    CrCl cannot be calculated (Unknown ideal weight.).    No Known Allergies  Antimicrobials this admission:   >>    >>   Dose adjustments this admission:   Microbiology results:  BCx:   UCx:    Sputum:    MRSA PCR:   Thank you for allowing pharmacy to be a part of this patient's care.  Everett Ricciardelli D 04/12/2023 10:58 PM

## 2023-04-12 NOTE — Assessment & Plan Note (Addendum)
 Symptomatic for chronic hip pain

## 2023-04-12 NOTE — Assessment & Plan Note (Signed)
 Possible fall versus inability to exit bathtub due to medication related somnolence and chronic hip pain No external injuries noted Daughter notes prior difficulty of patient getting out of bathtub due to left hip pain Patient recently prescribed methocarbamol on 3/23 which could have sedated patient PT OT eval Fall precautions Avoid sedating meds

## 2023-04-12 NOTE — ED Provider Notes (Signed)
 [facility] Provider Note    Event Date/Time   First MD Initiated Contact with Patient 04/12/23 1735     (approximate)   History   Altered Mental Status   HPI  Dean White is a 89 y.o. male who presents to the emergency department today after being found by EMS lying in his bathtub.  Apparently neighbors had not seen him for 2 days so called 911.  EMS states that he was in a bathtub with water and feces.  Patient is unable to state exactly how long he has been inside bathtub.  EMS did find patient to be hypoxic as well as hypothermic. Patient is oriented to name but cannot give any further history.     Physical Exam   Triage Vital Signs: ED Triage Vitals  Encounter Vitals Group     BP 04/12/23 1736 (!) 132/103     Systolic BP Percentile --      Diastolic BP Percentile --      Pulse Rate 04/12/23 1736 61     Resp 04/12/23 1736 (!) 26     Temp 04/12/23 1736 (S) (!) 86.6 F (30.3 C)     Temp Source 04/12/23 1736 Rectal     SpO2 04/12/23 1736 93 %     Weight 04/12/23 1737 145 lb (65.8 kg)     Height --      Head Circumference --      Peak Flow --      Pain Score 04/12/23 1737 0     Pain Loc --      Pain Education --      Exclude from Growth Chart --     Most recent vital signs: Vitals:   04/12/23 1736  BP: (!) 132/103  Pulse: 61  Resp: (!) 26  Temp: (S) (!) 86.6 F (30.3 C)  SpO2: 93%   General: Somewhat somnolent. Not completely oriented. CV:  Good peripheral perfusion. Regular rate and rhythm. Resp:  Normal effort. Tachypnea Abd:  No distention.  Other:  Large wound to buttocks.    ED Results / Procedures / Treatments   Labs (all labs ordered are listed, but only abnormal results are displayed) Labs Reviewed  LACTIC ACID, PLASMA - Abnormal; Notable for the following components:      Result Value   Lactic Acid, Venous 3.6 (*)    All other components within normal limits  LACTIC ACID, PLASMA - Abnormal; Notable for the  following components:   Lactic Acid, Venous 2.6 (*)    All other components within normal limits  COMPREHENSIVE METABOLIC PANEL WITH GFR - Abnormal; Notable for the following components:   Sodium 129 (*)    Potassium 3.2 (*)    Chloride 96 (*)    CO2 20 (*)    BUN 47 (*)    Calcium 8.2 (*)    Total Protein 4.8 (*)    Albumin 2.6 (*)    AST 111 (*)    ALT 85 (*)    Total Bilirubin 1.3 (*)    All other components within normal limits  CBC WITH DIFFERENTIAL/PLATELET - Abnormal; Notable for the following components:   Lymphs Abs 0.3 (*)    All other components within normal limits  PROTIME-INR - Abnormal; Notable for the following components:   Prothrombin Time 19.0 (*)    INR 1.6 (*)    All other components within normal limits  CK - Abnormal; Notable for the following components:   Total  CK 1,345 (*)    All other components within normal limits  URINALYSIS, W/ REFLEX TO CULTURE (INFECTION SUSPECTED) - Abnormal; Notable for the following components:   Color, Urine YELLOW (*)    APPearance CLEAR (*)    Hgb urine dipstick SMALL (*)    Ketones, ur 5 (*)    Bacteria, UA RARE (*)    All other components within normal limits  CULTURE, BLOOD (ROUTINE X 2)  CULTURE, BLOOD (ROUTINE X 2)  APTT  TROPONIN I (HIGH SENSITIVITY)  TROPONIN I (HIGH SENSITIVITY)     EKG  I, Phineas Semen, attending physician, personally viewed and interpreted this EKG  EKG Time: 1835 Rate: 55 Rhythm: sinus rhythm with PAC Axis: normal Intervals: qtc 525 QRS: narrow, q waves v1 ST changes: no st elevation Impression: abnormal ekg  RADIOLOGY I independently interpreted and visualized the CXR. My interpretation: No pneumonia Radiology interpretation:  IMPRESSION:  Elevated right hemidiaphragm with minimal right basilar subsegmental  atelectasis and small pleural effusion.    I independently interpreted and visualized the CT head/cervical spine. My interpretation: No ICH. No  fracture. Radiology interpretation:  IMPRESSION:  1. No evidence of acute intracranial abnormality or cervical spine  fracture.  2. Partially visualized right upper lobe ground-glass opacities,  likely infectious or inflammatory.  3.  Emphysema (ICD10-J43.9).     PROCEDURES:  Critical Care performed: Yes  CRITICAL CARE Performed by: Phineas Semen   Total critical care time: 45 minutes  Critical care time was exclusive of separately billable procedures and treating other patients.  Critical care was necessary to treat or prevent imminent or life-threatening deterioration.  Critical care was time spent personally by me on the following activities: development of treatment plan with patient and/or surrogate as well as nursing, discussions with consultants, evaluation of patient's response to treatment, examination of patient, obtaining history from patient or surrogate, ordering and performing treatments and interventions, ordering and review of laboratory studies, ordering and review of radiographic studies, pulse oximetry and re-evaluation of patient's condition.   Procedures    MEDICATIONS ORDERED IN ED: Medications - No data to display   IMPRESSION / MDM / ASSESSMENT AND PLAN / ED COURSE  I reviewed the triage vital signs and the nursing notes.                              Differential diagnosis includes, but is not limited to, ICH, CVA, infection, anemia, electrolyte abnormality, cellulitis  Patient's presentation is most consistent with acute presentation with potential threat to life or bodily function.   The patient is on the cardiac monitor to evaluate for evidence of arrhythmia and/or significant heart rate changes.  Patient presented to the emergency department today via EMS after being found in a bathtub.  Does sound like potentially patient had been in the bathtub for over 24 hours.  Patient was found to be significantly hypothermic.  He was placed on Bair  hugger and warmed blankets.  Additionally patient was given warmed fluids.  On physical exam patient is awake and alert although not completely oriented.  Does have a large wound to the buttocks.  Do have concern for infection to that area.  Blood work with a lactic acidosis.  No significant AKI at this time although CK was elevated.  Patient's mental status did improve while he was here in the emergency department and his body temperature increased.  Discussed with Dr. Para March with the hospitalist  service who will evaluate for admission.     FINAL CLINICAL IMPRESSION(S) / ED DIAGNOSES   Final diagnoses:  Elevated CK  Confusion  Cellulitis of buttock  Hypothermia, initial encounter    Note:  This document was prepared using Dragon voice recognition software and may include unintentional dictation errors.    Phineas Semen, MD 04/13/23 [phone]

## 2023-04-12 NOTE — H&P (Incomplete)
 History and Physical    Patient: Dean White MVH:[id number] DOB: 1928-07-01 DOA: 04/12/2023 DOS: the patient was seen and examined on 04/12/2023 PCP: Corky Downs, MD  Patient coming from: Home  Chief Complaint:  Chief Complaint  Patient presents with   Altered Mental Status    HPI: Dean White is a 89 y.o. 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  His neighbors called 911 after they had not seen him in a couple days.  He was found lying in the bathtub, partially filled with water with excrement.  He was awake, oriented to name.  Hypothermic with temp of 86 and hypoxic with EMS, brought in on NRB. History is provided by his daughter and POA Deitrick Ferreri over the phone.  At baseline he is independent in ADLs and IADLs and does everything himself including cooking and driving to his doctors appointments.  She states that a week ago, on 3/23 he was seen at the urgent care for left shoulder pain.  X-rays were reassuring and he was prescribed meloxicam and methocarbamol.  She states he had previously been prescribed methocarbamol in January for his hip pain but she stopped him from taking it after the second day due to concern for excessive drowsiness.  She did state that his hip had been giving him some trouble and while he loves to take baths, she had stopped him from soaking in the tub because he was having increasing difficulty getting out of the bathtub.  He agreed to only take showers, but he refused to use a shower stool as she recommended. ED course and data review: Temp 86.6 on arrival improving to 90.4 with Bair hugger at the time of admission.  Heart rate in the 60s to 70s, tachypneic to the teens, initial O2 sat 93% on NRB, weaned to O2 at 2 L  and maintaining sats in the low to mid 90s. Notable labs: WBC normal at 5400 With lactic acid 3.6--2.6 Sodium 129, potassium 3.2, chloride 96 BUN/creatinine 47/0.7, bicarb 20, 5 ketones in urine Elevated LFTs with AST 111, ALT 85 and total bili 1.3. normal alk phos CK13 45 Hemoglobin 15 and CBC otherwise normal Troponin 12, INR 1.6 Calcium 8.2, total protein 4.8 and albumin 2.6 UA not consistent with infection EKG, personally viewed and interpreted showing NSR at 55 with nonspecific T wave changes Imaging CT head and C-spine nonacute but showing groundglass opacity right upper lobe Chest x-ray showing minimal right basilar subsegmental atelectasis and small pleural effusion  Patient treated with warm NS boluses started on sepsis antibiotics for unknown source.  Lawyer as above  Hospitalist consulted for admission    Past Medical History:  Diagnosis Date   Adenocarcinoma of prostate (HCC)    BPH with obstruction/lower urinary tract symptoms    HLD (hyperlipidemia)    Paget disease of bone    Past Surgical History:  Procedure Laterality Date   CHOLECYSTECTOMY     PROSTATE BIOPSY     PROSTATE SURGERY     Social History:  reports that he has quit smoking. He has never used smokeless tobacco. He reports that he does not drink alcohol and does not use drugs.  No Known Allergies  Family History  Problem Relation Age of  Onset   Cancer Son        melanoma   Breast cancer Sister    Cancer Brother    Kidney disease Neg Hx    Prostate cancer Neg Hx     Prior to Admission medications   Medication Sig Start Date End Date Taking? Authorizing Provider  cholecalciferol (VITAMIN D) 1000 UNITS tablet Take 1,000 Units by mouth daily.    [provider]  latanoprost (XALATAN) 0.005 % ophthalmic solution place 1 drop into both eyes at bedtime 05/12/14   [provider]  Leuprolide Acetate, 6 Month, (LUPRON) 45 MG injection Inject 45 mg into the muscle every 6 (six)  months.    [provider]  lovastatin (MEVACOR) 40 MG tablet TAKE 1 TABLET BY MOUTH DAILY 10/05/21   Corky Downs, MD  Misc Natural Products (PROSTATE HEALTH PO) Take by mouth.    [provider]  traZODone (DESYREL) 50 MG tablet Take 0.5-1 tablets (25-50 mg total) by mouth at bedtime as needed for sleep. 06/27/21   Corky Downs, MD    Physical Exam: Vitals:   04/12/23 2030 04/12/23 2045 04/12/23 2115 04/12/23 2230  BP: (!) 100/54 (!) 103/44 108/63 109/70  Pulse: 80 76 80 90  Resp: 15 16 15  (!) 21  Temp: (!) 89.3 F (31.8 C) (!) 89.6 F (32 C) (!) 90.4 F (32.4 C) (!) 92.9 F (33.8 C)  TempSrc:      SpO2: 93% 94% 93% 96%  Weight:       Physical Exam Vitals and nursing note reviewed.  Constitutional:      General: He is not in acute distress.    Comments: Restless when seen, repeatedly saying "I want to get out of here".  Trying to get out from under the bear hugger.  Not opening eyes and not answering questions asked.(Daughter states he is hearing impaired)  HENT:     Head: Normocephalic and atraumatic.  Cardiovascular:     Rate and Rhythm: Normal rate and regular rhythm.     Heart sounds: Normal heart sounds.  Pulmonary:     Effort: Pulmonary effort is normal.     Breath sounds: Normal breath sounds.  Abdominal:     Palpations: Abdomen is soft.     Tenderness: There is no abdominal tenderness.  Skin:    Comments: Redness over bony prominences on right side of the body, right cheek, shoulder, elbow, hip, knee  Neurological:     General: No focal deficit present.  Psychiatric:     Comments: Restless, mildly agitated     Labs on Admission: I have personally reviewed following labs and imaging studies  CBC: Recent Labs  Lab 04/12/23 1745  WBC 5.4  NEUTROABS 4.7  HGB 15.7  HCT 47.1  MCV 91.3  PLT 189   Basic Metabolic Panel: Recent Labs  Lab 04/12/23 1745  NA 129*  K 3.2*  CL 96*  CO2 20*  GLUCOSE 86  BUN 47*  CREATININE 0.70   CALCIUM 8.2*   GFR: CrCl cannot be calculated (Unknown ideal weight.). Liver Function Tests: Recent Labs  Lab 04/12/23 1745  AST 111*  ALT 85*  ALKPHOS 52  BILITOT 1.3*  PROT 4.8*  ALBUMIN 2.6*   No results for input(s): "LIPASE", "AMYLASE" in the last 168 hours. No results for input(s): "AMMONIA" in the last 168 hours. Coagulation Profile: Recent Labs  Lab 04/12/23 1745  INR 1.6*   Cardiac Enzymes: Recent Labs  Lab 04/12/23 1745  CKTOTAL 1,345*  BNP (last 3 results) No results for input(s): "PROBNP" in the last 8760 hours. HbA1C: No results for input(s): "HGBA1C" in the last 72 hours. CBG: No results for input(s): "GLUCAP" in the last 168 hours. Lipid Profile: No results for input(s): "CHOL", "HDL", "LDLCALC", "TRIG", "CHOLHDL", "LDLDIRECT" in the last 72 hours. Thyroid Function Tests: No results for input(s): "TSH", "T4TOTAL", "FREET4", "T3FREE", "THYROIDAB" in the last 72 hours. Anemia Panel: No results for input(s): "VITAMINB12", "FOLATE", "FERRITIN", "TIBC", "IRON", "RETICCTPCT" in the last 72 hours. Urine analysis:    Component Value Date/Time   COLORURINE YELLOW (A) 04/12/2023 1739   APPEARANCEUR CLEAR (A) 04/12/2023 1739   LABSPEC 1.019 04/12/2023 1739   PHURINE 5.0 04/12/2023 1739   GLUCOSEU NEGATIVE 04/12/2023 1739   HGBUR SMALL (A) 04/12/2023 1739   BILIRUBINUR NEGATIVE 04/12/2023 1739   KETONESUR 5 (A) 04/12/2023 1739   PROTEINUR NEGATIVE 04/12/2023 1739   NITRITE NEGATIVE 04/12/2023 1739   LEUKOCYTESUR NEGATIVE 04/12/2023 1739    Radiological Exams on Admission: CT Head Wo Contrast Result Date: 04/12/2023 CLINICAL DATA:  Weakness.  Found in bathtub. EXAM: CT HEAD WITHOUT CONTRAST CT CERVICAL SPINE WITHOUT CONTRAST TECHNIQUE: Multidetector CT imaging of the head and cervical spine was performed following the standard protocol without intravenous contrast. Multiplanar CT image reconstructions of the cervical spine were also generated. RADIATION  DOSE REDUCTION: This exam was performed according to the departmental dose-optimization program which includes automated exposure control, adjustment of the mA and/or kV according to patient size and/or use of iterative reconstruction technique. COMPARISON:  CT chest 09/04/2016 FINDINGS: CT HEAD FINDINGS Brain: There is no evidence of an acute infarct, intracranial hemorrhage, mass, midline shift, or extra-axial fluid collection. Cerebral volume is within normal limits for age with normal size of the ventricles. Vascular: No hyperdense vessel or unexpected calcification. Skull: No fracture. 1.1 cm lucent lesion with well-defined sclerotic margin in the lateral aspect of the right orbital roof, nonspecific but without aggressive features. Sinuses/Orbits: Mild mucosal thickening in the paranasal sinuses. No significant mastoid fluid. Bilateral cataract extraction. Other: None. CT CERVICAL SPINE FINDINGS Alignment: Cervical spine straightening. Grade 1 anterolisthesis of C7 on T1. Skull base and vertebrae: No acute fracture. Well-defined 9 mm lucency in the base of the dens, possibly a geode. Soft tissues and spinal canal: No prevertebral fluid or swelling. No visible canal hematoma. Disc levels: Interbody and facet ankylosis at C5-6. Moderate to severe disc degeneration at C6-7 greater than C3-4 with milder disc degeneration elsewhere in the cervical spine. Widespread advanced cervical facet arthrosis. Moderate to severe multilevel neural foraminal stenosis. Suspected moderate spinal stenosis at C3-4, C5-6, and C6-7. Upper chest: Emphysema. Partially visualized patchy and clustered nodular ground-glass opacities in the right upper lobe. 5 mm subpleural nodule in the medial left upper lobe, unchanged from 2018 and considered benign with no follow-up imaging recommended. Other: None. IMPRESSION: 1. No evidence of acute intracranial abnormality or cervical spine fracture. 2. Partially visualized right upper lobe  ground-glass opacities, likely infectious or inflammatory. 3.  Emphysema (ICD10-J43.9). Electronically Signed   By: Sebastian Ache M.D.   On: 04/12/2023 19:26   CT Cervical Spine Wo Contrast Result Date: 04/12/2023 CLINICAL DATA:  Weakness.  Found in bathtub. EXAM: CT HEAD WITHOUT CONTRAST CT CERVICAL SPINE WITHOUT CONTRAST TECHNIQUE: Multidetector CT imaging of the head and cervical spine was performed following the standard protocol without intravenous contrast. Multiplanar CT image reconstructions of the cervical spine were also generated. RADIATION DOSE REDUCTION: This exam was performed according to the departmental  dose-optimization program which includes automated exposure control, adjustment of the mA and/or kV according to patient size and/or use of iterative reconstruction technique. COMPARISON:  CT chest 09/04/2016 FINDINGS: CT HEAD FINDINGS Brain: There is no evidence of an acute infarct, intracranial hemorrhage, mass, midline shift, or extra-axial fluid collection. Cerebral volume is within normal limits for age with normal size of the ventricles. Vascular: No hyperdense vessel or unexpected calcification. Skull: No fracture. 1.1 cm lucent lesion with well-defined sclerotic margin in the lateral aspect of the right orbital roof, nonspecific but without aggressive features. Sinuses/Orbits: Mild mucosal thickening in the paranasal sinuses. No significant mastoid fluid. Bilateral cataract extraction. Other: None. CT CERVICAL SPINE FINDINGS Alignment: Cervical spine straightening. Grade 1 anterolisthesis of C7 on T1. Skull base and vertebrae: No acute fracture. Well-defined 9 mm lucency in the base of the dens, possibly a geode. Soft tissues and spinal canal: No prevertebral fluid or swelling. No visible canal hematoma. Disc levels: Interbody and facet ankylosis at C5-6. Moderate to severe disc degeneration at C6-7 greater than C3-4 with milder disc degeneration elsewhere in the cervical spine. Widespread  advanced cervical facet arthrosis. Moderate to severe multilevel neural foraminal stenosis. Suspected moderate spinal stenosis at C3-4, C5-6, and C6-7. Upper chest: Emphysema. Partially visualized patchy and clustered nodular ground-glass opacities in the right upper lobe. 5 mm subpleural nodule in the medial left upper lobe, unchanged from 2018 and considered benign with no follow-up imaging recommended. Other: None. IMPRESSION: 1. No evidence of acute intracranial abnormality or cervical spine fracture. 2. Partially visualized right upper lobe ground-glass opacities, likely infectious or inflammatory. 3.  Emphysema (ICD10-J43.9). Electronically Signed   By: Sebastian Ache M.D.   On: 04/12/2023 19:26   DG Chest Port 1 View Result Date: 04/12/2023 CLINICAL DATA:  Found in tub. EXAM: PORTABLE CHEST 1 VIEW COMPARISON:  February 29, 2016. FINDINGS: The heart size and mediastinal contours are within normal limits. Left lung is clear. Elevated right hemidiaphragm is noted with minimal right basilar subsegmental atelectasis and pleural effusion. The visualized skeletal structures are unremarkable. IMPRESSION: Elevated right hemidiaphragm with minimal right basilar subsegmental atelectasis and small pleural effusion. Electronically Signed   By: Lupita Raider M.D.   On: 04/12/2023 18:18     Data Reviewed: Relevant notes from primary care and specialist visits, past discharge summaries as available in EHR, including Care Everywhere. Prior diagnostic testing as pertinent to current admission diagnoses Updated medications and problem lists for reconciliation ED course, including vitals, labs, imaging, treatment and response to treatment Triage notes, nursing and pharmacy notes and ED provider's notes Notable results as noted in HPI   Assessment and Plan: * Severe sepsis (HCC) Severe sepsis versus SIRS related to exposure RUL pneumonia on CT Patient is not tachycardic likely due to hypovolemia.  He was  tachypneic, hypoxic with lactic acidosis, elevated LFTs.  WBC normal Will treat as sepsis as follows: IV fluid resuscitation Cefepime vancomycin and Flagyl Follow cultures  Pneumonia, suspect aspiration Acute respiratory failure with hypoxia Possible aspiration pneumonia due to lying in the tub for 2 days Antibiotics as above for sepsis SLP consult Supplemental oxygen Aspiration precautions Follow cultures  Hypothermia due to exposure Suspect related to lying in water for 2 days, temp 86 with EMS Continue Bair hugger  Acute metabolic encephalopathy Possibly related to hypothermia/SIRS/sepsis Head CT with no acute intracranial abnormality Will get MRI to evaluate for possible stroke, though no focal deficit At baseline patient functions independently Neurologic checks with aspiration and fall precautions  Delirium precautions Haldol as needed  Abnormal LFTs Possibly related to sepsis Abnormal RUQ ultrasound 09/2022-cirrhotic morphology/CBD dilatation RUQ [place] in 09/2022 showed cirrhotic morphology of the liver.Dilatation of the common bile duct up to 11 mm. Recommend correlation with LFTs. If there is concern for biliary obstruction, MRCP may be considered. Will get CT abdomen and pelvis given sepsis of uncertain source Monitor for improvement with IV hydration  Rhabdomyolysis Uncertain whether traumatic or not IV hydration and monitor CK  Hyponatremia Hypochloremia Likely secondary to dehydration IV hydration with NS  Pressure injury of deep tissue Patient with multiple pressure injuries, appears to be stage I over bony eminences right side of body Wound care consult Decubitus precautions  Fall in bathtub, partially filled Possible fall versus inability to exit bathtub due to medication related somnolence and chronic hip pain No external injuries noted Daughter notes prior difficulty of patient getting out of bathtub due to left hip pain Patient recently prescribed  methocarbamol on 3/23 which could have sedated patient PT OT eval Fall precautions Avoid sedating meds  Paget's bone disease Symptomatic for chronic hip pain  Adenocarcinoma of prostate [facility]) On leuprolide shots Last seen by urology November 2024 given 76-month follow-up     DVT prophylaxis: Lovenox  Consults: none  Advance Care Planning: full code  Family Communication: Daughter and Programmer, systems  Disposition Plan: Back to previous home environment  Severity of Illness: The appropriate patient status for this patient is INPATIENT. Inpatient status is judged to be reasonable and necessary in order to provide the required intensity of service to ensure the patient's safety. The patient's presenting symptoms, physical exam findings, and initial radiographic and laboratory data in the context of their chronic comorbidities is felt to place them at high risk for further clinical deterioration. Furthermore, it is not anticipated that the patient will be medically stable for discharge from the hospital within 2 midnights of admission.   * I certify that at the point of admission it is my clinical judgment that the patient will require inpatient hospital care spanning beyond 2 midnights from the point of admission due to high intensity of service, high risk for further deterioration and high frequency of surveillance required.*  Author: Andris Baumann, MD 04/12/2023 10:36 PM  For on call review [email or web address].

## 2023-04-12 NOTE — Assessment & Plan Note (Signed)
 Severe sepsis versus SIRS related to exposure RUL pneumonia on CT Patient is tachycardic with heart rate in the 100's and 110's. This is likely due to hypovolemia.   Hypothermia has resolved. He remains hypotensive. He will receive a 1L bolus of NS. He is also receiving. Cefepime vancomycin and Flagyl Follow cultures

## 2023-04-12 NOTE — Assessment & Plan Note (Signed)
 Hypochloremia Likely secondary to dehydration IV hydration with NS

## 2023-04-12 NOTE — Assessment & Plan Note (Addendum)
 Acute respiratory failure with hypoxia Possible aspiration pneumonia due to lying in the tub for 2 days Antibiotics include cefepime and vancomycin. SLP consult when patient is able to cooperate with exam. Supplemental oxygen to keep saturations 88-92%. Aspiration precautions Follow cultures

## 2023-04-12 NOTE — Assessment & Plan Note (Signed)
 Uncertain whether traumatic or not IV hydration and monitor CK

## 2023-04-12 NOTE — Assessment & Plan Note (Addendum)
 Possibly related to sepsis Abnormal RUQ ultrasound 09/2022-cirrhotic morphology/CBD dilatation RUQ [place] in 09/2022 showed cirrhotic morphology of the liver.Dilatation of the common bile duct up to 11 mm. Recommend correlation with LFTs. If there is concern for biliary obstruction, MRCP may be considered. Will get CT abdomen and pelvis given sepsis of uncertain source Monitor for improvement with IV hydration

## 2023-04-12 NOTE — Assessment & Plan Note (Signed)
 Decubitus precautions Wound care

## 2023-04-12 NOTE — Assessment & Plan Note (Addendum)
 On leuprolide shots Last seen by urology November 2024 given 44-month follow-up

## 2023-04-12 NOTE — ED Triage Notes (Signed)
 Pt BIB EMS after being found in bath tub. Neighbors called for a wellness check. Per EMS, pt has been in bath rub for approximately 1-2 days. Pts rectal temp with EMS was 86. Pt a&ox2. Per EMS, pt was found in his urine and feces.

## 2023-04-12 NOTE — Assessment & Plan Note (Signed)
 Suspect related to lying in water for 2 days, temp 86 with EMS Continue Bair hugger

## 2023-04-12 NOTE — Assessment & Plan Note (Addendum)
 Possibly related to hypothermia/SIRS/sepsis Head CT with no acute intracranial abnormality. Being repeated due to anisocoria. Will get MRI to evaluate for possible stroke, though no focal deficit At baseline patient functions independently Neurologic checks with aspiration and fall precautions Delirium precautions Haldol as needed

## 2023-04-13 ENCOUNTER — Inpatient Hospital Stay: Payer: Medicare (Managed Care)

## 2023-04-13 DIAGNOSIS — A419 Sepsis, unspecified organism: Secondary | ICD-10-CM | POA: Diagnosis not present

## 2023-04-13 DIAGNOSIS — R571 Hypovolemic shock: Secondary | ICD-10-CM

## 2023-04-13 DIAGNOSIS — J9601 Acute respiratory failure with hypoxia: Secondary | ICD-10-CM | POA: Diagnosis present

## 2023-04-13 DIAGNOSIS — R748 Abnormal levels of other serum enzymes: Secondary | ICD-10-CM | POA: Diagnosis not present

## 2023-04-13 DIAGNOSIS — L03317 Cellulitis of buttock: Secondary | ICD-10-CM

## 2023-04-13 DIAGNOSIS — R6520 Severe sepsis without septic shock: Secondary | ICD-10-CM | POA: Diagnosis not present

## 2023-04-13 DIAGNOSIS — R410 Disorientation, unspecified: Secondary | ICD-10-CM

## 2023-04-13 DIAGNOSIS — L8996 Pressure-induced deep tissue damage of unspecified site: Secondary | ICD-10-CM | POA: Insufficient documentation

## 2023-04-13 DIAGNOSIS — Z515 Encounter for palliative care: Secondary | ICD-10-CM

## 2023-04-13 DIAGNOSIS — J9690 Respiratory failure, unspecified, unspecified whether with hypoxia or hypercapnia: Secondary | ICD-10-CM | POA: Diagnosis present

## 2023-04-13 LAB — GLUCOSE, CAPILLARY
Glucose-Capillary: 78 mg/dL (ref 70–99)
Glucose-Capillary: 89 mg/dL (ref 70–99)

## 2023-04-13 LAB — BLOOD GAS, ARTERIAL
Acid-base deficit: 6.3 mmol/L — ABNORMAL HIGH (ref 0.0–2.0)
Acid-base deficit: 5.2 mmol/L — ABNORMAL HIGH (ref 0.0–2.0)
Bicarbonate: 18.1 mmol/L — ABNORMAL LOW (ref 20.0–28.0)
Bicarbonate: 19.1 mmol/L — ABNORMAL LOW (ref 20.0–28.0)
FIO2: 100 %
MECHVT: 500 mL
Mechanical Rate: 20
O2 Content: 2 L/min
O2 Saturation: 85.6 %
O2 Saturation: 97.7 %
PEEP: 5 cmH2O
Patient temperature: 37
Patient temperature: 37
pCO2 arterial: 32 mmHg (ref 32–48)
pCO2 arterial: 33 mmHg (ref 32–48)
pH, Arterial: 7.36 (ref 7.35–7.45)
pH, Arterial: 7.37 (ref 7.35–7.45)
pO2, Arterial: 55 mmHg — ABNORMAL LOW (ref 83–108)
pO2, Arterial: 84 mmHg (ref 83–108)

## 2023-04-13 LAB — CBC
HCT: 42.5 % (ref 39.0–52.0)
Hemoglobin: 14.3 g/dL (ref 13.0–17.0)
MCH: 30.6 pg (ref 26.0–34.0)
MCHC: 33.6 g/dL (ref 30.0–36.0)
MCV: 90.8 fL (ref 80.0–100.0)
Platelets: 170 10*3/uL (ref 150–400)
RBC: 4.68 MIL/uL (ref 4.22–5.81)
RDW: 12.5 % (ref 11.5–15.5)
WBC: 4.1 10*3/uL (ref 4.0–10.5)
nRBC: 0 % (ref 0.0–0.2)

## 2023-04-13 LAB — COMPREHENSIVE METABOLIC PANEL WITH GFR
ALT: 73 U/L — ABNORMAL HIGH (ref 0–44)
ALT: 83 U/L — ABNORMAL HIGH (ref 0–44)
AST: 92 U/L — ABNORMAL HIGH (ref 15–41)
AST: 129 U/L — ABNORMAL HIGH (ref 15–41)
Albumin: 2.2 g/dL — ABNORMAL LOW (ref 3.5–5.0)
Albumin: 2.3 g/dL — ABNORMAL LOW (ref 3.5–5.0)
Alkaline Phosphatase: 40 U/L (ref 38–126)
Alkaline Phosphatase: 44 U/L (ref 38–126)
Anion gap: 12 (ref 5–15)
Anion gap: 17 — ABNORMAL HIGH (ref 5–15)
BUN: 41 mg/dL — ABNORMAL HIGH (ref 8–23)
BUN: 42 mg/dL — ABNORMAL HIGH (ref 8–23)
CO2: 19 mmol/L — ABNORMAL LOW (ref 22–32)
CO2: 15 mmol/L — ABNORMAL LOW (ref 22–32)
Calcium: 7.6 mg/dL — ABNORMAL LOW (ref 8.9–10.3)
Calcium: 8 mg/dL — ABNORMAL LOW (ref 8.9–10.3)
Chloride: 102 mmol/L (ref 98–111)
Chloride: 104 mmol/L (ref 98–111)
Creatinine, Ser: 0.8 mg/dL (ref 0.61–1.24)
Creatinine, Ser: 1.08 mg/dL (ref 0.61–1.24)
GFR, Estimated: >60 mL/min (ref >60)
GFR, Estimated: >60 mL/min (ref >60)
Glucose, Bld: 54 mg/dL — ABNORMAL LOW (ref 70–99)
Glucose, Bld: 77 mg/dL (ref 70–99)
Potassium: 3.2 mmol/L — ABNORMAL LOW (ref 3.5–5.1)
Potassium: 3.2 mmol/L — ABNORMAL LOW (ref 3.5–5.1)
Sodium: 133 mmol/L — ABNORMAL LOW (ref 135–145)
Sodium: 136 mmol/L (ref 135–145)
Total Bilirubin: 1.2 mg/dL (ref 0.0–1.2)
Total Bilirubin: 1 mg/dL (ref 0.0–1.2)
Total Protein: 4.2 g/dL — ABNORMAL LOW (ref 6.5–8.1)
Total Protein: 4.4 g/dL — ABNORMAL LOW (ref 6.5–8.1)

## 2023-04-13 LAB — CBG MONITORING, ED
Glucose-Capillary: 103 mg/dL — ABNORMAL HIGH (ref 70–99)
Glucose-Capillary: 46 mg/dL — ABNORMAL LOW (ref 70–99)

## 2023-04-13 LAB — TROPONIN I (HIGH SENSITIVITY)
Troponin I (High Sensitivity): 47 ng/L — ABNORMAL HIGH (ref <18)
Troponin I (High Sensitivity): 42 ng/L — ABNORMAL HIGH (ref <18)

## 2023-04-13 LAB — CK
Total CK: 556 U/L — ABNORMAL HIGH (ref 49–397)
Total CK: 2756 U/L — ABNORMAL HIGH (ref 49–397)

## 2023-04-13 LAB — TSH: TSH: 3.265 u[IU]/mL (ref 0.350–4.500)

## 2023-04-13 LAB — PHOSPHORUS: Phosphorus: 3.1 mg/dL (ref 2.5–4.6)

## 2023-04-13 LAB — SARS CORONAVIRUS 2 BY RT PCR: SARS Coronavirus 2 by RT PCR: NEGATIVE

## 2023-04-13 LAB — LACTIC ACID, PLASMA: Lactic Acid, Venous: 2.7 mmol/L (ref 0.5–1.9)

## 2023-04-13 LAB — MAGNESIUM: Magnesium: 2.1 mg/dL (ref 1.7–2.4)

## 2023-04-13 MED ORDER — MIDAZOLAM HCL 2 MG/2ML IJ SOLN
1.0000 mg | INTRAMUSCULAR | Status: DC | PRN
  Administered 2023-04-14: 2 mg via INTRAVENOUS
  Filled 2023-04-13: qty 2

## 2023-04-13 MED ORDER — FAMOTIDINE 20 MG PO TABS
20.0000 mg | ORAL_TABLET | Freq: Two times a day (BID) | ORAL | Status: DC
  Administered 2023-04-13: 20 mg
  Filled 2023-04-13: qty 1

## 2023-04-13 MED ORDER — MIDAZOLAM HCL 2 MG/2ML IJ SOLN
INTRAMUSCULAR | Status: AC
  Administered 2023-04-13: 2 mg via INTRAVENOUS
  Filled 2023-04-13: qty 2

## 2023-04-13 MED ORDER — ORAL CARE MOUTH RINSE: 15.0000 mL | OROMUCOSAL | Status: DC | PRN

## 2023-04-13 MED ORDER — SODIUM CHLORIDE 0.9 % IV SOLN
500.0000 mg | INTRAVENOUS | Status: DC
  Administered 2023-04-13 – 2023-04-15 (×3): 500 mg via INTRAVENOUS
  Filled 2023-04-13 (×5): qty 5

## 2023-04-13 MED ORDER — VECURONIUM BROMIDE 10 MG IV SOLR: 10.0000 mg | Freq: Once | INTRAVENOUS | Status: AC

## 2023-04-13 MED ORDER — CHLORHEXIDINE GLUCONATE CLOTH 2 % EX PADS
6.0000 | MEDICATED_PAD | Freq: Every day | CUTANEOUS | Status: DC
  Administered 2023-04-13 – 2023-04-15 (×3): 6 via TOPICAL

## 2023-04-13 MED ORDER — DORZOLAMIDE HCL-TIMOLOL MAL 2-0.5 % OP SOLN
1.0000 [drp] | Freq: Two times a day (BID) | OPHTHALMIC | Status: DC
  Administered 2023-04-13 – 2023-04-16 (×6): 1 [drp] via OPHTHALMIC
  Filled 2023-04-13: qty 10

## 2023-04-13 MED ORDER — FENTANYL CITRATE PF 50 MCG/ML IJ SOSY: 25.0000 ug | PREFILLED_SYRINGE | INTRAMUSCULAR | Status: DC | PRN

## 2023-04-13 MED ORDER — FENTANYL CITRATE PF 50 MCG/ML IJ SOSY
PREFILLED_SYRINGE | INTRAMUSCULAR | Status: AC
  Administered 2023-04-13: 100 ug via INTRAVENOUS
  Filled 2023-04-13: qty 2

## 2023-04-13 MED ORDER — LACTATED RINGERS IV BOLUS
1000.0000 mL | Freq: Once | INTRAVENOUS | Status: AC
  Administered 2023-04-13: 1000 mL via INTRAVENOUS

## 2023-04-13 MED ORDER — HEPARIN SODIUM (PORCINE) 5000 UNIT/ML IJ SOLN
5000.0000 [IU] | Freq: Three times a day (TID) | INTRAMUSCULAR | Status: DC
  Administered 2023-04-13 – 2023-04-16 (×8): 5000 [IU] via SUBCUTANEOUS
  Filled 2023-04-13 (×8): qty 1

## 2023-04-13 MED ORDER — ORAL CARE MOUTH RINSE
15.0000 mL | OROMUCOSAL | Status: DC
  Administered 2023-04-14 – 2023-04-17 (×39): 15 mL via OROMUCOSAL

## 2023-04-13 MED ORDER — POLYETHYLENE GLYCOL 3350 17 G PO PACK: 17.0000 g | PACK | Freq: Every day | ORAL | Status: DC | PRN

## 2023-04-13 MED ORDER — HYDROCORTISONE SOD SUC (PF) 100 MG IJ SOLR
100.0000 mg | Freq: Three times a day (TID) | INTRAMUSCULAR | Status: DC
  Administered 2023-04-13 – 2023-04-14 (×2): 100 mg via INTRAVENOUS
  Filled 2023-04-13 (×4): qty 2

## 2023-04-13 MED ORDER — IPRATROPIUM-ALBUTEROL 0.5-2.5 (3) MG/3ML IN SOLN: 3.0000 mL | Freq: Four times a day (QID) | RESPIRATORY_TRACT | Status: DC | PRN

## 2023-04-13 MED ORDER — POTASSIUM CHLORIDE 10 MEQ/100ML IV SOLN
10.0000 meq | INTRAVENOUS | Status: AC
  Administered 2023-04-13 – 2023-04-14 (×3): 10 meq via INTRAVENOUS
  Filled 2023-04-13 (×3): qty 100

## 2023-04-13 MED ORDER — POLYETHYLENE GLYCOL 3350 17 G PO PACK
17.0000 g | PACK | Freq: Every day | ORAL | Status: DC
  Filled 2023-04-13: qty 1

## 2023-04-13 MED ORDER — SODIUM CHLORIDE 0.9 % IV SOLN: INTRAVENOUS | Status: AC

## 2023-04-13 MED ORDER — MIDODRINE HCL 5 MG PO TABS
5.0000 mg | ORAL_TABLET | Freq: Three times a day (TID) | ORAL | Status: DC
Start: 2023-04-13 — End: 2023-04-14
  Administered 2023-04-14: 5 mg via ORAL
  Filled 2023-04-13: qty 1

## 2023-04-13 MED ORDER — FENTANYL CITRATE PF 50 MCG/ML IJ SOSY: 100.0000 ug | PREFILLED_SYRINGE | Freq: Once | INTRAMUSCULAR | Status: AC

## 2023-04-13 MED ORDER — PHENYLEPHRINE HCL-NACL 20-0.9 MG/250ML-% IV SOLN
25.0000 ug/min | INTRAVENOUS | Status: DC
  Administered 2023-04-13: 25 ug/min via INTRAVENOUS
  Administered 2023-04-14 (×2): 85 ug/min via INTRAVENOUS
  Filled 2023-04-13 (×4): qty 250

## 2023-04-13 MED ORDER — DOCUSATE SODIUM 50 MG/5ML PO LIQD
100.0000 mg | Freq: Two times a day (BID) | ORAL | Status: DC
  Administered 2023-04-14: 100 mg
  Filled 2023-04-13: qty 10

## 2023-04-13 MED ORDER — POTASSIUM CHLORIDE 10 MEQ/100ML IV SOLN
10.0000 meq | Freq: Once | INTRAVENOUS | Status: AC
  Administered 2023-04-14: 10 meq via INTRAVENOUS
  Filled 2023-04-13: qty 100

## 2023-04-13 MED ORDER — LATANOPROST 0.005 % OP SOLN
1.0000 [drp] | Freq: Every day | OPHTHALMIC | Status: DC
  Administered 2023-04-13 – 2023-04-15 (×3): 1 [drp] via OPHTHALMIC
  Filled 2023-04-13: qty 2.5

## 2023-04-13 MED ORDER — DEXTROSE 50 % IV SOLN: 1.0000 | Freq: Once | INTRAVENOUS | Status: AC

## 2023-04-13 MED ORDER — DOCUSATE SODIUM 50 MG/5ML PO LIQD: 100.0000 mg | Freq: Two times a day (BID) | ORAL | Status: DC | PRN

## 2023-04-13 MED ORDER — SODIUM CHLORIDE 0.9 % IV SOLN
250.0000 mL | INTRAVENOUS | Status: AC
  Administered 2023-04-13: 250 mL via INTRAVENOUS

## 2023-04-13 MED ORDER — DORZOLAMIDE HCL-TIMOLOL MAL 2-0.5 % OP SOLN
1.0000 [drp] | Freq: Two times a day (BID) | OPHTHALMIC | Status: DC
  Filled 2023-04-13: qty 10

## 2023-04-13 MED ORDER — DEXTROSE 50 % IV SOLN
INTRAVENOUS | Status: AC
  Administered 2023-04-13: 50 mL via INTRAVENOUS
  Filled 2023-04-13: qty 50

## 2023-04-13 MED ORDER — IPRATROPIUM-ALBUTEROL 0.5-2.5 (3) MG/3ML IN SOLN
3.0000 mL | Freq: Four times a day (QID) | RESPIRATORY_TRACT | Status: DC
  Administered 2023-04-14 – 2023-04-15 (×6): 3 mL via RESPIRATORY_TRACT
  Filled 2023-04-13 (×7): qty 3

## 2023-04-13 MED ORDER — DORZOLAMIDE HCL 2 % OP SOLN
1.0000 [drp] | Freq: Two times a day (BID) | OPHTHALMIC | Status: DC
  Filled 2023-04-13: qty 10

## 2023-04-13 MED ORDER — DEXMEDETOMIDINE HCL IN NACL 400 MCG/100ML IV SOLN
0.0000 ug/kg/h | INTRAVENOUS | Status: AC
  Administered 2023-04-13: 0.4 ug/kg/h via INTRAVENOUS
  Filled 2023-04-13: qty 100

## 2023-04-13 MED ORDER — MIDAZOLAM HCL 2 MG/2ML IJ SOLN: 2.0000 mg | Freq: Once | INTRAMUSCULAR | Status: AC

## 2023-04-13 MED ORDER — SODIUM CHLORIDE 0.9 % IV BOLUS
1000.0000 mL | Freq: Once | INTRAVENOUS | Status: AC
  Administered 2023-04-13: 1000 mL via INTRAVENOUS

## 2023-04-13 MED ORDER — POTASSIUM CHLORIDE 20 MEQ PO PACK: 40.0000 meq | PACK | Freq: Once | ORAL | Status: DC

## 2023-04-13 MED ORDER — VECURONIUM BROMIDE 10 MG IV SOLR
INTRAVENOUS | Status: AC
  Administered 2023-04-13: 10 mg via INTRAVENOUS
  Filled 2023-04-13: qty 10

## 2023-04-13 MED ORDER — TIMOLOL MALEATE 0.5 % OP SOLN
1.0000 [drp] | Freq: Two times a day (BID) | OPHTHALMIC | Status: DC
  Filled 2023-04-13: qty 5

## 2023-04-13 MED ORDER — FAMOTIDINE IN NACL 20-0.9 MG/50ML-% IV SOLN: 20.0000 mg | Freq: Two times a day (BID) | INTRAVENOUS | Status: DC

## 2023-04-13 MED ORDER — FENTANYL CITRATE PF 50 MCG/ML IJ SOSY
25.0000 ug | PREFILLED_SYRINGE | INTRAMUSCULAR | Status: DC | PRN
  Administered 2023-04-13 – 2023-04-14 (×2): 50 ug via INTRAVENOUS
  Filled 2023-04-13 (×2): qty 1

## 2023-04-13 NOTE — ED Notes (Signed)
 Pt has skin break down on bilateral buttocks, right shoulder, and right elbow.

## 2023-04-13 NOTE — IPAL (Signed)
  Interdisciplinary Goals of Care Family Meeting   Date carried out: 04/13/2023  Location of the meeting: Bedside  Member's involved: Physician, Nurse Practitioner, Bedside Registered Nurse, Family Member or next of kin, and Palliative care team member    GOALS OF CARE DISCUSSION  The Clinical status was relayed to family in detail- Daughter Dean White, Dean White and Dean White over the phone  Updated and notified of patients medical condition- Patient remains unresponsive and will not open eyes to command.   Patient with increased WOB and using accessory muscles to breathe Explained to family course of therapy and the modalities  Patient with Progressive multiorgan failure with a very high probablity of a very minimal chance of meaningful recovery despite all aggressive and optimal medical therapy.    Family understands the situation.  They have consented and agreed to DNR status Continue Vent support Pressors if needed Patient woould NOT want HD Patient with severe shock, with severe resp failure  Family are satisfied with Plan of action and management. All questions answered  Additional CC time 35 mins   Dean White, M.D.  [facility]  Medical Director [facility] Medical Director [facility] Cardio-Pulmonary Department

## 2023-04-13 NOTE — ED Notes (Signed)
 MD at bedside. Preparing for intubation. Waiting on RT.

## 2023-04-13 NOTE — ED Notes (Signed)
 Pt daughter at bedside

## 2023-04-13 NOTE — Progress Notes (Signed)
 Progress Note   Patient: Dean White:[id number] DOB: 1928/04/29 DOA: 04/12/2023     1 DOS: the patient was seen and examined on 04/13/2023   Brief hospital course:  Dean White is a 89 y.o. 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  His neighbors called 911 after they had not seen him in a couple days.  He was found lying in the bathtub, partially filled with water with excrement.  He was awake, oriented to name.  Hypothermic with temp of 86 and hypoxic with EMS, brought in on NRB. History is provided by his daughter and POA Dean White over the phone.  At baseline he is independent in ADLs and IADLs and does everything himself including cooking and driving to his doctors appointments.  She states that a week ago, on 3/23 he was seen at the urgent care for left shoulder pain.  X-rays were reassuring and he was prescribed meloxicam and methocarbamol.  She states he had previously been prescribed methocarbamol in January for his hip pain but she stopped him from taking it after the second day due to concern for excessive drowsiness.  She did state that his hip had been giving him some trouble and while he loves to take baths, she had stopped him from soaking in the tub because he was having increasing difficulty getting out of the bathtub.  He agreed to only take showers, but he refused to use a shower stool as she recommended. ED course and data review: Temp 86.6 on arrival improving to 90.4 with Bair hugger at the time of admission.  Heart rate in the 60s to 70s, tachypneic to the teens, initial O2 sat 93% on NRB, weaned to O2 at 2 L and maintaining sats in the low to mid 90s. Notable labs: WBC normal at 5400 With lactic acid 3.6--2.6 Sodium 129,  potassium 3.2, chloride 96 BUN/creatinine 47/0.7, bicarb 20, 5 ketones in urine Elevated LFTs with AST 111, ALT 85 and total bili 1.3. normal alk phos CK13 45 Hemoglobin 15 and CBC otherwise normal Troponin 12, INR 1.6 Calcium 8.2, total protein 4.8 and albumin 2.6 UA not consistent with infection EKG, personally viewed and interpreted showing NSR at 55 with nonspecific T wave changes Imaging CT head and C-spine nonacute but showing groundglass opacity right upper lobe Chest x-ray showing minimal right basilar subsegmental atelectasis and small pleural effusion   Patient treated with warm NS boluses started on sepsis antibiotics for unknown source.  Bair hugger also used to increase temperature.  At the time that I visited the patient he was sleeping, but rousable. He would follow commands for me. His temperature was 98.3, HR 103, BP was low 84/67. He was saturating 98% on 2L.   I was contacted this afternoon and advised that the patient's BP had dropped to 67/42. He was given a 1L bolus. Also nursing reported anisocoria. The patient was sent for a stat CT of the head. It is being done now. Report is pending.   Assessment and Plan: * Severe sepsis (HCC) Severe sepsis versus SIRS related to exposure RUL pneumonia on CT Patient is not tachycardic likely due to hypovolemia.  He was tachypneic, hypoxic with lactic acidosis, elevated LFTs.  WBC normal Will treat  as sepsis as follows: IV fluid resuscitation Cefepime vancomycin and Flagyl Follow cultures  Pneumonia, suspect aspiration Acute respiratory failure with hypoxia Possible aspiration pneumonia due to lying in the tub for 2 days Antibiotics as above for sepsis SLP consult Supplemental oxygen Aspiration precautions Follow cultures  Hypothermia due to exposure Suspect related to lying in water for 2 days, temp 86 with EMS Continue Bair hugger  Acute metabolic encephalopathy Possibly related to hypothermia/SIRS/sepsis Head CT  with no acute intracranial abnormality Will get MRI to evaluate for possible stroke, though no focal deficit At baseline patient functions independently Neurologic checks with aspiration and fall precautions Delirium precautions Haldol as needed  Abnormal LFTs Possibly related to sepsis Abnormal RUQ ultrasound 09/2022-cirrhotic morphology/CBD dilatation RUQ [place] in 09/2022 showed cirrhotic morphology of the liver.Dilatation of the common bile duct up to 11 mm. Recommend correlation with LFTs. If there is concern for biliary obstruction, MRCP may be considered. Will get CT abdomen and pelvis given sepsis of uncertain source Monitor for improvement with IV hydration  Rhabdomyolysis Uncertain whether traumatic or not IV hydration and monitor CK  Hyponatremia Hypochloremia Likely secondary to dehydration IV hydration with NS  Pressure injury of deep tissue Patient with multiple pressure injuries, appears to be stage I over bony eminences right side of body Wound care consult Decubitus precautions  Fall in bathtub, partially filled Possible fall versus inability to exit bathtub due to medication related somnolence and chronic hip pain No external injuries noted Daughter notes prior difficulty of patient getting out of bathtub due to left hip pain Patient recently prescribed methocarbamol on 3/23 which could have sedated patient PT OT eval Fall precautions Avoid sedating meds  Paget's bone disease Symptomatic for chronic hip pain  Adenocarcinoma of prostate [facility]) On leuprolide shots Last seen by urology November 2024 given 58-month follow-up  Hypoglycemia The patient has received an amp of D 50.  Hypotension The patient was given 1 L IV NS. He has had some improvement in his BP. He will require more fluid.       Subjective: The patient is lying on the gurney. He will follow commands, but does not specifically track me or acknowledge me. He is not verbally  communicative.  Physical Exam: Vitals:   04/13/23 1300 04/13/23 1315 04/13/23 1355 04/13/23 1400  BP: (!) 73/58 (!) 73/63 (!) 64/45 (!) 79/46  Pulse: (!) 106 (!) 106 (!) 108 (!) 114  Resp: 14 15 14 13   Temp: 98.6 F (37 C) 98.7 F (37.1 C) 98.7 F (37.1 C) 98.7 F (37.1 C)  TempSrc:      SpO2: 96% 98% 97% 97%  Weight:       Exam:  Constitutional:  The patient is stuperous. No acute distress. Eyes:  pupils and irises appear normal Neck:  neck appears normal, no masses, normal ROM, supple no thyromegaly Respiratory:  No increased work of breathing. No wheezes, rales, or rhonchi No tactile fremitus Cardiovascular:  Regular rate and rhythm, High heart rate No murmurs, ectopy, or gallups. No lateral PMI. No thrills. Abdomen:  Abdomen is soft, non-tender, non-distended No hernias, masses, or organomegaly Normoactive bowel sounds.  Musculoskeletal:  No cyanosis, clubbing, or edema Skin:  No rashes, lesions, ulcers palpation of skin: no induration or nodules Neurologic:  The patient is unable to cooperate with exam. Psychiatric:  The patient is unable to cooperate with exam.  Family Communication: None available.  Disposition: Status is: Inpatient Remains inpatient appropriate because: Patient is critically ill and in need  of emergent and immediate medical attention or risk further organ system deterioration and possibly death.  Planned Discharge Destination:  To be determined.    Time spent: 52 minutes  Author: Cyndy Braver, DO 04/13/2023 3:39 PM  For on call review [email or web address].

## 2023-04-13 NOTE — ED Notes (Addendum)
 RT at bedside.

## 2023-04-13 NOTE — ED Notes (Signed)
 Patient transported to MRI

## 2023-04-13 NOTE — Consult Note (Signed)
 NAME:  Dean White, MRN:  [id number], DOB:  04/14/1928, LOS: 1 ADMISSION DATE:  04/12/2023  CHIEF COMPLAINT:  unresponsive   History of Present Illness:  89 y.o. 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    Nephew called  911 after they had not seen him in a couple days.  He was found lying in the bathtub, partially filled with water with excrement.    Initially He was awake, oriented to name.   Hypothermic with temp of 86 and hypoxic with EMS, brought in on NRB.  History is provided by his daughter and POA Ibrohim Simmers over the phone.   At baseline he is independent in ADLs and IADLs and does everything himself including cooking and driving to his doctors appointments.   She did state that his hip had been giving him some trouble and while he loves to take baths, she had stopped him from soaking in the tub because he was having increasing difficulty getting out of the bathtub.  He agreed to only take showers, but he refused to use a shower stool as she recommended.   ED course and data review:  Temp 86.6 on arrival improving to 90.4 with Bair hugger at the time of admission.  Heart rate in the 60s to 70s, tachypneic to the teens, initial O2 sat 93% on NRB, weaned to O2 at 2 L and maintaining sats in the low to mid 90s. Notable labs: WBC normal at 5400 With lactic acid 3.6--2.6 Sodium 129, potassium 3.2, chloride 96 BUN/creatinine 47/0.7, bicarb 20, 5 ketones in urine Elevated LFTs with AST 111, ALT 85 and total bili 1.3. normal alk phos CK13 45 Hemoglobin 15 and CBC otherwise normal Troponin 12, INR 1.6 Calcium 8.2, total protein 4.8 and albumin 2.6 UA not consistent with infection EKG, personally viewed and interpreted showing NSR at 55 with  nonspecific T wave changes Imaging CT head and C-spine nonacute but showing groundglass opacity right upper lobe Chest x-ray showing minimal right basilar subsegmental atelectasis and small pleural effusion   PCCM consulted for LOW BP Upon further assessment at bedside, patient with increased WOB and SOB, not responding to vocal or painful stimuli, I asked ER to check FSBS which was 47, 1 AMP glucose given, Blood pressure cuff was placed on wrist with  pediatric cuff that was placed backwards, I took adult BP cuff and placed properly in the RT antecubital position and BP without pressors registered 96/65  I have also asked for INFECTIOUS VIRAL PANEL WORK UP, as well as assess for ACUTE CORONARY SYNDROME  Significant Hospital Events: Including procedures, antibiotic start and stop dates in addition to other pertinent events   PCCM consulted for shock     Micro Data:  UA NEG BLOOD CX NGTD RESP VIRAL PANEL, COVID,RSV/FLU PENDING Antimicrobials:   Antibiotics Given (last 72 hours)     Date/Time Action Medication Dose Rate   04/12/23 1921 New Bag/Given   ceFEPIme (MAXIPIME) 2 g in sodium chloride 0.9 % 100 mL IVPB 2 g 200 mL/hr   04/12/23 2006 New Bag/Given   vancomycin (VANCOCIN) IVPB 1000 mg/200 mL premix 1,000 mg 200 mL/hr   04/12/23 2319 New Bag/Given   metroNIDAZOLE (FLAGYL) IVPB 500 mg 500 mg 100  mL/hr   04/13/23 0137 New Bag/Given   vancomycin (VANCOREADY) IVPB 500 mg/100 mL 500 mg 100 mL/hr   04/13/23 0734 New Bag/Given   ceFEPIme (MAXIPIME) 2 g in sodium chloride 0.9 % 100 mL IVPB 2 g 200 mL/hr   04/13/23 1013 New Bag/Given   metroNIDAZOLE (FLAGYL) IVPB 500 mg 500 mg 100 mL/hr            Interim History / Subjective:  Severe encephalopathy High risk for cardiac arrest High risk for intubation      Objective   Blood pressure (!) 79/46, pulse (!) 114, temperature 98.7 F (37.1 C), resp. rate 13, weight 65.8 kg, SpO2 97%.        Intake/Output Summary (Last  24 hours) at 04/13/2023 1701 Last data filed at 04/13/2023 0256 Gross per 24 hour  Intake 2500 ml  Output --  Net 2500 ml   Filed Weights   04/12/23 1737  Weight: 65.8 kg     REVIEW OF SYSTEMS  PATIENT IS UNABLE TO PROVIDE COMPLETE REVIEW OF SYSTEMS DUE TO SEVERE CRITICAL ILLNESS   PHYSICAL EXAMINATION:  GENERAL:critically ill appearing, +resp distress EYES: Pupils equal, round, reactive to light.  No scleral icterus.  MOUTH: Moist mucosal membrane. NECK: Supple.  PULMONARY: Lungs clear to auscultation, +rhonchi CARDIOVASCULAR: S1 and S2.  Regular rate and rhythm GASTROINTESTINAL: Soft, nontender, -distended. Positive bowel sounds.  MUSCULOSKELETAL: No swelling, clubbing, or edema.  NEUROLOGIC: obtunded SKIN:normal, warm to touch, Capillary refill delayed  Pulses present bilaterally   Labs/imaging that I havepersonally reviewed  (right click and "Reselect all SmartList Selections" daily)        ASSESSMENT AND PLAN SYNOPSIS  89 yo male with multiple medical issues with severe Rhabdo with with hypovolumic shock with severe lactic acidosis with hypothermia and hypoglycemia  Severe ACUTE Hypoxic  High risk for intubation and cardiac arrest  SHOCK SOURCE-dehydration, hypovolemia -use vasopressors to keep MAP>65 as needed -follow ABG and LA as needed -follow up cultures -emperic ABX -aggressive IV fluid Resuscitation  CARDIAC  -oxygen as needed -Lasix as tolerated -follow up cardiac enzymes as indicated   CARDIAC ICU monitoring   RENAL -continue Foley Catheter-assess need -Avoid nephrotoxic agents -Follow urine output, BMP -Ensure adequate renal perfusion, optimize oxygenation -Renal dose medications   Intake/Output Summary (Last 24 hours) at 04/13/2023 1701 Last data filed at 04/13/2023 0256 Gross per 24 hour  Intake 2500 ml  Output --  Net 2500 ml     NEUROLOGY Acute  metabolic encephalopathy   INFECTIOUS DISEASE -continue antibiotics as  prescribed -follow up cultures  ENDO - ICU hypoglycemic\Hyperglycemia protocol -check FSBS per protocol Check TSH   GI GI PROPHYLAXIS as indicated  NUTRITIONAL STATUS DIET-->NPO Constipation protocol as indicated   ELECTROLYTES -follow labs as needed -replace as needed -pharmacy consultation and following   ACUTE ANEMIA- TRANSFUSE AS NEEDED CONSIDER TRANSFUSION  IF HGB<7    Best practice (right click and "Reselect all SmartList Selections" daily)  Diet: NPO DVT prophylaxis: LMWH Mobility:  bed rest  Code Status:  FULL Disposition:ICU  Labs   CBC: Recent Labs  Lab 04/12/23 1745 04/13/23 0522  WBC 5.4 4.1  NEUTROABS 4.7  --   HGB 15.7 14.3  HCT 47.1 42.5  MCV 91.3 90.8  PLT 189 170    Basic Metabolic Panel: Recent Labs  Lab 04/12/23 1745 04/13/23 0522  NA 129* 133*  K 3.2* 3.2*  CL 96* 102  CO2 20* 19*  GLUCOSE 86 54*  BUN 47* 41*  CREATININE 0.70 0.80  CALCIUM 8.2* 7.6*   GFR: CrCl cannot be calculated (Unknown ideal weight.). Recent Labs  Lab 04/12/23 1739 04/12/23 1745 04/12/23 1939 04/13/23 0522  PROCALCITON 0.25  --   --   --   WBC  --  5.4  --  4.1  LATICACIDVEN  --  3.6* 2.6*  --     Liver Function Tests: Recent Labs  Lab 04/12/23 1745 04/13/23 0522  AST 111* 92*  ALT 85* 73*  ALKPHOS 52 40  BILITOT 1.3* 1.2  PROT 4.8* 4.2*  ALBUMIN 2.6* 2.2*   No results for input(s): "LIPASE", "AMYLASE" in the last 168 hours. No results for input(s): "AMMONIA" in the last 168 hours.  ABG    Component Value Date/Time   PHART 7.36 04/13/2023 1625   PCO2ART 32 04/13/2023 1625   PO2ART 55 (L) 04/13/2023 1625   HCO3 18.1 (L) 04/13/2023 1625   ACIDBASEDEF 6.3 (H) 04/13/2023 1625   O2SAT 85.6 04/13/2023 1625     Coagulation Profile: Recent Labs  Lab 04/12/23 1745  INR 1.6*    Cardiac Enzymes: Recent Labs  Lab 04/12/23 1745 04/13/23 0522  CKTOTAL 1,345* 556*    HbA1C: No results found for: "HGBA1C"  CBG: Recent  Labs  Lab 04/13/23 1642  GLUCAP 46*     Past Medical History:  He,  has a past medical history of Adenocarcinoma of prostate (HCC), BPH with obstruction/lower urinary tract symptoms, HLD (hyperlipidemia), and Paget disease of bone.   Surgical History:   Past Surgical History:  Procedure Laterality Date   CHOLECYSTECTOMY     PROSTATE BIOPSY     PROSTATE SURGERY       Social History:   reports that he has quit smoking. He has never used smokeless tobacco. He reports that he does not drink alcohol and does not use drugs.   Family History:  His family history includes Breast cancer in his sister; Cancer in his brother and son. There is no history of Kidney disease or Prostate cancer.   Allergies Allergies  Allergen Reactions   Aspirin Other (See Comments)    Nose bleeds     Home Medications  Prior to Admission medications   Medication Sig Start Date End Date Taking? Authorizing Provider  cholecalciferol (VITAMIN D) 1000 UNITS tablet Take 1,000 Units by mouth daily.   Yes [provider]  dorzolamide-timolol (COSOPT) 2-0.5 % ophthalmic solution Place 1 drop into both eyes 2 (two) times daily. 03/07/23  Yes [provider]  ibuprofen (ADVIL) 800 MG tablet Take 800 mg by mouth every 8 (eight) hours as needed for moderate pain (pain score 4-6).   Yes [provider]  latanoprost (XALATAN) 0.005 % ophthalmic solution place 1 drop into both eyes at bedtime 05/12/14  Yes [provider]  lovastatin (MEVACOR) 40 MG tablet TAKE 1 TABLET BY MOUTH DAILY 10/05/21  Yes Corky Downs, MD  Misc Natural Products (PROSTATE HEALTH PO) Take by mouth.   Yes [provider]  Multiple Vitamin (MULTIVITAMIN WITH MINERALS) TABS tablet Take 1 tablet by mouth daily.   Yes [provider]  Leuprolide Acetate, 6 Month, (LUPRON) 45 MG injection Inject 45 mg into the muscle every 6 (six) months. Patient not taking: Reported on 04/12/2023    [provider]      Critical Care Time devoted to patient care services described in this note is 75 minutes.   Critical care was necessary to treat /prevent imminent and life-threatening deterioration.  PATIENT WITH VERY POOR PROGNOSIS I ANTICIPATE PROLONGED ICU LOS  Patient is critically ill. Patient with Multiorgan failure and at high risk for cardiac arrest and death.    Lucie Leather, M.D.  [facility]  Medical Director [facility] Medical Director [facility] Cardio-Pulmonary Department

## 2023-04-13 NOTE — Progress Notes (Signed)
 Transported to ICU on the vent without incident. Pt remains on the vent and is tol well at this time. Report given to ICU RT.

## 2023-04-13 NOTE — ED Notes (Signed)
RT called for intubation

## 2023-04-13 NOTE — Progress Notes (Signed)
 PHARMACY CONSULT NOTE - ELECTROLYTES  Pharmacy Consult for Electrolyte Monitoring and Replacement   Recent Labs: Weight: 65.8 kg (145 lb) CrCl cannot be calculated (Unknown ideal weight.). Potassium (mmol/L)  Date Value  04/13/2023 3.2 (L)   Calcium (mg/dL)  Date Value  16/10/9602 7.6 (L)   Albumin (g/dL)  Date Value  54/09/8117 2.2 (L)   Sodium (mmol/L)  Date Value  04/13/2023 133 (L)   Corrected Ca: 9 mg/dL  Assessment  Dean White is a 89 y.o. male presenting with severe sepsis. PMH significant for prostate cancer on leuprolide, HTN, and Paget's disease of the bone with chronic hip pain and mild hearing impairment. Pharmacy has been consulted to monitor and replace electrolytes.  Diet: NPO MIVF: NS @ 10-20 mL/hr Pertinent medications: N/A  Goal of Therapy: Electrolytes WNL  Plan:  Replace with 40 mEq KCl Check BMP, Mg, Phos with AM labs  Thank you for allowing pharmacy to be a part of this patient's care.  Netta Neat, PharmD Clinical Pharmacist 04/13/2023 6:48 PM

## 2023-04-13 NOTE — ED Notes (Signed)
 Pt had a BM at this time. BM is dark and loose. Upon wiping pt BM is green in color. No protection provided over sacral wound upon assessment. Pt cleaned/peri-care provided. Chux pads placed. Medaplex sacral pads applied to entirety of buttocks over stage 2 pressure wound. Will turn pt upon return from CT.

## 2023-04-13 NOTE — Assessment & Plan Note (Signed)
 Patient with multiple pressure injuries, appears to be stage I over bony eminences right side of body Wound care consult Decubitus precautions

## 2023-04-13 NOTE — ED Notes (Signed)
 Pt taken to CT.

## 2023-04-13 NOTE — IPAL (Addendum)
  Interdisciplinary Goals of Care Family Meeting   Date carried out: 04/13/2023  Location of the meeting: Phone conference  Member's involved: Physician and Family Member or next of kin  Durable Power of Attorney or acting medical decision maker: Daughter [name] Vicars    Discussion: We discussed goals of care for [employer] .  I discussed diagnosis, prognosis, goals of care and end-of-life wishes, disposition and options. We discussed advanced directives including concept specific to code status, artifical feeding and hydration, and continued inpatient treatment as well as the difference between non aggressive medical intervention path  and a palliative comfort care path for this patient . Values and goals of care were attempted to be elicited.  Decision Maker: Daughter Bassel Gaskill   Code status:   Code Status: Full Code   Disposition: Continue current acute care  Time spent for the meeting: 32    Andris Baumann, MD  04/13/2023, 12:42 AM

## 2023-04-13 NOTE — Progress Notes (Addendum)
 SLP Cancellation Note  Patient Details Name: Dean White MRN: [id number] DOB: 1928-04-21   Cancelled treatment:       Reason Eval/Treat Not Completed: Patient not medically ready;Medical issues which prohibited therapy;Fatigue/lethargy limiting ability to participate;Patient's level of consciousness (chart reviewed; consulted NSG re: pt's status this morning. Noted Code status and discussion by MD w/ Family re: pt's Code status.)  Pt is not appropriate for BSE/assessment for po intake d/t his declined Cognitive alertness and overall Medical presentation this morning. Recommend continue Strict NPO status w/ any meds given via alternative means d/t his HIGH aspiration risk at this time.  ST services will f/u tomorrow for appropriateness for assessment. Recommend oral care when able to for hygiene and stimulation of swallowing. Consulted NSG who agreed.   Jerilynn Som, MS, CCC-SLP Speech Language Pathologist Rehab Services; [facility] [phone] (ascom) Trelyn Vanderlinde 04/13/2023, 9:21 AM

## 2023-04-13 NOTE — Procedures (Signed)
 Endotracheal Intubation: Patient required placement of an artificial airway secondary to Respiratory Failure  Consent: Emergent.   Hand washing performed prior to starting the procedure.   Medications administered for sedation prior to procedure:  Midazolam 4 mg IV,  Vecuronium 10 mg IV, Fentanyl 100 mcg IV.    A time out procedure was called and correct patient, name, & ID confirmed. Needed supplies and equipment were assembled and checked to include ETT, 10 ml syringe, Glidescope, Mac and Miller blades, suction, oxygen and bag mask valve, end tidal CO2 monitor.   Patient was positioned to align the mouth and pharynx to facilitate visualization of the glottis.   Heart rate, SpO2 and blood pressure was continuously monitored during the procedure. Pre-oxygenation was conducted prior to intubation and endotracheal tube was placed through the vocal cords into the trachea.     The artificial airway was placed under direct visualization via glidescope route using a 8.0 ETT on the first attempt.  ETT was secured at 23 cm mark.  Placement was confirmed by auscuitation of lungs with good breath sounds bilaterally and no stomach sounds.  Condensation was noted on endotracheal tube.   Pulse ox 98%.  CO2 detector in place with appropriate color change.   Complications: None .   Operator: Stanlee Roehrig/Tukov  Chest radiograph ordered and pending.   Comments: OGT placed via glidescope.  Lucie Leather, M.D.  [facility]  Medical Director [facility] Medical Director [facility] Cardio-Pulmonary Department

## 2023-04-13 NOTE — Consult Note (Signed)
 Consultation Note Date: 04/13/2023   Patient Name: YOLANDA DOCKENDORF  DOB: 03-28-1928  MRN: [id number]  Age / Sex: 89 y.o., male  PCP: Corky Downs, MD Referring Physician: Fran Lowes, DO  Reason for Consultation: Establishing goals of care   HPI/Brief Hospital Course: 89 y.o. male  with past medical history of Prostate cancer on Ieuprolide, HTN and Paget's disease of the bone with chronic hip pain and mild hearing impairment admitted from home on 04/12/2023 after being found down in bath tub for approximately for 1-2 days. Neighbors called 911 for wellness check after not seeing Mr. Wageman for several days.  On arrival to ED found to be hypothermic and hypoxic-improved on NRB, placed on Bair hugger  While holding in ED became hypotensive, increased WOB, minimally responsive found to be significantly hypoxic, blood pressures improved once BP cuff placed correctly   Concern for shock, acute hypoxic respiratory failure, hypotension--admitted to ICU  Palliative medicine was consulted for assisting with goals of care conversations.  Subjective:  Extensive chart review has been completed prior to meeting patient including labs, vital signs, imaging, progress notes, orders, and available advanced directive documents from current and previous encounters.  Visited at bedside in collaboration with Dr. Abran Duke team. Daughter-Angela at bedside.  Introduced myself as a Publishing rights manager as a member of the palliative care team. Explained palliative medicine is specialized medical care for people living with serious illness. It focuses on providing relief from the symptoms and stress of a serious illness. The goal is to improve quality of life for both the patient and the family.   Dr. Belia Heman provides medical updates.  At baseline, [name] shares Mr. Scheidt functions completely independent. Originally from [place], has been in [place] for about 30 years. Has three  children, two sons and Angela-she has been appointed as Product manager. All children live in [place]. He is widowed x1 and divorced x1. Daughter last spoke with him on Thursday and he had been up that day mowing his lawn.  We discussed patient's current illness and what it means in the larger context of patient's on-going co-morbidities. Natural disease trajectory and expectations at EOL were discussed.   The difference between aggressive medical intervention and comfort care was discussed.  CCM team concern for respiratory status, discussed mechanical intubation-Angela agreed to proceed.  Spoke with [name] and her two brothers via conference call with Dr. Belia Heman, medical updates provided, now intubated, likely in need of vasopressor support.  We discussed code status and the difference between Full Code and Do No Resuscitate. Encouraged family to consider DNR/DNI status understanding evidenced based poor outcomes in similar hospitalized patients, as the cause of the arrest is likely associated with chronic/terminal disease rather than a reversible acute cardio-pulmonary event.  [name] and her brothers agree to DNR status at this time. Brothers to attempt to visit from out of state this week, wish to continue with aggressive measures at this time.  DNR order placed by CCM team.  I discussed importance of continued conversations with family/support persons and all members of their medical team regarding overall plan of care and treatment options ensuring decisions are in alignment with patients goals of care.  All questions/concerns addressed. Emotional support provided to patient/family/support persons. PMT will continue to follow and support patient as needed.  Objective: Primary Diagnoses: Present on Admission:  Hypothermia due to exposure  Severe sepsis (HCC)  Paget's bone disease  Adenocarcinoma of prostate (HCC)   Vital Signs: BP (!) 79/46   Pulse Marland Kitchen)  114   Temp 98.7 F (37.1 C)   Resp 13   Wt  65.8 kg   SpO2 97%   BMI 22.71 kg/m  Pain Scale: 0-10   Pain Score: 0-No pain  IO: Intake/output summary:  Intake/Output Summary (Last 24 hours) at 04/13/2023 1718 Last data filed at 04/13/2023 0256 Gross per 24 hour  Intake 2500 ml  Output --  Net 2500 ml    LBM:   Baseline Weight: Weight: 65.8 kg Most recent weight: Weight: 65.8 kg      Assessment and Plan  SUMMARY OF RECOMMENDATIONS   DNR Time for outcomes Ongoing GOC needed  Discussed With: CCM team and nursing staff   Thank you for this consult and allowing Palliative Medicine to participate in the care of [name]. Palliative medicine will continue to follow and assist as needed.   Time Total: 75 minutes  Time spent includes: Detailed review of medical records (labs, imaging, vital signs), medically appropriate exam (mental status, respiratory, cardiac, skin), discussed with treatment team, counseling and educating patient, family and staff, documenting clinical information, medication management and coordination of care.   Signed by: Leeanne Deed, DNP, AGNP-C Palliative Medicine    Please contact Palliative Medicine Team phone at [phone] for questions and concerns.  For individual provider: See Loretha Stapler

## 2023-04-13 NOTE — IPAL (Signed)
 Interdisciplinary Goals of Care Family Meeting     Date carried out: 04/13/2023   Location of the meeting: Bedside   Member's involved: Daughter Charisse Klinefelter and Nurse Practitioner       GOALS OF CARE DISCUSSION   The Clinical status was relayed to family in detail   Updated and notified of patients medical condition Patient is now opening his eyes, appears to be attempting to talk, inconsistently nodding head yes/no Remains on the ventilator With improvement in neurological status daughter would like to give patient a change to "fight" and make him a full code for tonight  Explained if patient were to require chest compressions the likelihood of survival would be extremely low  Informed daughter to discuss with her family plan for when breathing tube is removed, if he were to decline would they want to have him re intubated or transition to comfort measures. She is aware this discussion will occur likely tomorrow     Family understands the situation. Will call her with any changes in patient condition overnight

## 2023-04-13 NOTE — ED Notes (Signed)
 Messaged MD about concern for low BP at this time.

## 2023-04-13 NOTE — ED Notes (Signed)
 Informed MD of increased HR. Pt receiving 2L bolus at this time.

## 2023-04-13 NOTE — ED Notes (Signed)
 MD at bedside.

## 2023-04-14 ENCOUNTER — Inpatient Hospital Stay

## 2023-04-14 DIAGNOSIS — R6520 Severe sepsis without septic shock: Secondary | ICD-10-CM | POA: Diagnosis not present

## 2023-04-14 DIAGNOSIS — J690 Pneumonitis due to inhalation of food and vomit: Secondary | ICD-10-CM | POA: Diagnosis not present

## 2023-04-14 DIAGNOSIS — A419 Sepsis, unspecified organism: Secondary | ICD-10-CM | POA: Diagnosis not present

## 2023-04-14 DIAGNOSIS — G928 Other toxic encephalopathy: Secondary | ICD-10-CM | POA: Diagnosis not present

## 2023-04-14 DIAGNOSIS — M6282 Rhabdomyolysis: Secondary | ICD-10-CM

## 2023-04-14 DIAGNOSIS — Z7189 Other specified counseling: Secondary | ICD-10-CM | POA: Diagnosis not present

## 2023-04-14 DIAGNOSIS — J9601 Acute respiratory failure with hypoxia: Secondary | ICD-10-CM | POA: Diagnosis not present

## 2023-04-14 DIAGNOSIS — E162 Hypoglycemia, unspecified: Secondary | ICD-10-CM

## 2023-04-14 LAB — RESPIRATORY PANEL BY PCR

## 2023-04-14 LAB — CBC
HCT: 43.5 % (ref 39.0–52.0)
Hemoglobin: 14.6 g/dL (ref 13.0–17.0)
MCH: 30.5 pg (ref 26.0–34.0)
MCHC: 33.6 g/dL (ref 30.0–36.0)
MCV: 90.8 fL (ref 80.0–100.0)
Platelets: 186 10*3/uL (ref 150–400)
RBC: 4.79 MIL/uL (ref 4.22–5.81)
RDW: 13.1 % (ref 11.5–15.5)
WBC: 15.8 10*3/uL — ABNORMAL HIGH (ref 4.0–10.5)
nRBC: 0.1 % (ref 0.0–0.2)

## 2023-04-14 LAB — CK: Total CK: 2283 U/L — ABNORMAL HIGH (ref 49–397)

## 2023-04-14 LAB — HEPATIC FUNCTION PANEL
ALT: 85 U/L — ABNORMAL HIGH (ref 0–44)
AST: 126 U/L — ABNORMAL HIGH (ref 15–41)
Albumin: 2.2 g/dL — ABNORMAL LOW (ref 3.5–5.0)
Alkaline Phosphatase: 45 U/L (ref 38–126)
Bilirubin, Direct: 0.2 mg/dL (ref 0.0–0.2)
Indirect Bilirubin: 0.8 mg/dL (ref 0.3–0.9)
Total Bilirubin: 1 mg/dL (ref 0.0–1.2)
Total Protein: 4.5 g/dL — ABNORMAL LOW (ref 6.5–8.1)

## 2023-04-14 LAB — GLUCOSE, CAPILLARY
Glucose-Capillary: 86 mg/dL (ref 70–99)
Glucose-Capillary: 91 mg/dL (ref 70–99)
Glucose-Capillary: 106 mg/dL — ABNORMAL HIGH (ref 70–99)
Glucose-Capillary: 96 mg/dL (ref 70–99)
Glucose-Capillary: 122 mg/dL — ABNORMAL HIGH (ref 70–99)
Glucose-Capillary: 136 mg/dL — ABNORMAL HIGH (ref 70–99)
Glucose-Capillary: 130 mg/dL — ABNORMAL HIGH (ref 70–99)

## 2023-04-14 LAB — STREP PNEUMONIAE URINARY ANTIGEN: Strep Pneumo Urinary Antigen: NEGATIVE

## 2023-04-14 LAB — BASIC METABOLIC PANEL WITH GFR
Anion gap: 11 (ref 5–15)
BUN: 35 mg/dL — ABNORMAL HIGH (ref 8–23)
CO2: 18 mmol/L — ABNORMAL LOW (ref 22–32)
Calcium: 8.2 mg/dL — ABNORMAL LOW (ref 8.9–10.3)
Chloride: 110 mmol/L (ref 98–111)
Creatinine, Ser: 0.88 mg/dL (ref 0.61–1.24)
GFR, Estimated: >60 mL/min (ref >60)
Glucose, Bld: 93 mg/dL (ref 70–99)
Potassium: 3.8 mmol/L (ref 3.5–5.1)
Sodium: 139 mmol/L (ref 135–145)

## 2023-04-14 LAB — PHOSPHORUS: Phosphorus: 2.7 mg/dL (ref 2.5–4.6)

## 2023-04-14 LAB — MRSA NEXT GEN BY PCR, NASAL: MRSA by PCR Next Gen: DETECTED — AB

## 2023-04-14 LAB — MAGNESIUM: Magnesium: 2.1 mg/dL (ref 1.7–2.4)

## 2023-04-14 LAB — LACTIC ACID, PLASMA: Lactic Acid, Venous: 1.9 mmol/L (ref 0.5–1.9)

## 2023-04-14 MED ORDER — FREE WATER
30.0000 mL | Status: DC
  Administered 2023-04-14 – 2023-04-15 (×3): 30 mL

## 2023-04-14 MED ORDER — PIPERACILLIN-TAZOBACTAM 3.375 G IVPB
3.3750 g | Freq: Three times a day (TID) | INTRAVENOUS | Status: DC
  Administered 2023-04-14 – 2023-04-16 (×6): 3.375 g via INTRAVENOUS
  Filled 2023-04-14 (×6): qty 50

## 2023-04-14 MED ORDER — GERHARDT'S BUTT CREAM
TOPICAL_CREAM | Freq: Three times a day (TID) | CUTANEOUS | Status: DC
  Filled 2023-04-14: qty 60

## 2023-04-14 MED ORDER — PANTOPRAZOLE SODIUM 40 MG IV SOLR
40.0000 mg | Freq: Every day | INTRAVENOUS | Status: DC
  Administered 2023-04-14 – 2023-04-15 (×2): 40 mg via INTRAVENOUS
  Filled 2023-04-14 (×2): qty 10

## 2023-04-14 MED ORDER — NOREPINEPHRINE 4 MG/250ML-% IV SOLN
2.0000 ug/min | INTRAVENOUS | Status: DC
  Administered 2023-04-14: 2 ug/min via INTRAVENOUS
  Administered 2023-04-14: 7 ug/min via INTRAVENOUS
  Filled 2023-04-14 (×2): qty 250

## 2023-04-14 MED ORDER — VITAL AF 1.2 CAL PO LIQD
1000.0000 mL | ORAL | Status: DC
  Administered 2023-04-14: 1000 mL

## 2023-04-14 MED ORDER — SODIUM CHLORIDE 0.9 % IV SOLN
250.0000 mL | INTRAVENOUS | Status: AC
  Administered 2023-04-14: 250 mL via INTRAVENOUS

## 2023-04-14 MED ORDER — MUPIROCIN 2 % EX OINT
1.0000 | TOPICAL_OINTMENT | Freq: Two times a day (BID) | CUTANEOUS | Status: DC
  Administered 2023-04-14 – 2023-04-16 (×5): 1 via NASAL
  Filled 2023-04-14: qty 22

## 2023-04-14 MED ORDER — HYDROCORTISONE SOD SUC (PF) 100 MG IJ SOLR
100.0000 mg | Freq: Two times a day (BID) | INTRAMUSCULAR | Status: DC
  Administered 2023-04-14 – 2023-04-15 (×2): 100 mg via INTRAVENOUS
  Filled 2023-04-14: qty 2

## 2023-04-14 MED ORDER — JUVEN PO PACK
1.0000 | PACK | Freq: Two times a day (BID) | ORAL | Status: DC
  Administered 2023-04-15: 1

## 2023-04-14 MED ORDER — MEDIHONEY WOUND/BURN DRESSING EX PSTE
1.0000 | PASTE | Freq: Every day | CUTANEOUS | Status: DC
  Administered 2023-04-14 – 2023-04-16 (×3): 1 via TOPICAL
  Filled 2023-04-14: qty 44

## 2023-04-14 NOTE — Consult Note (Signed)
 WOC Nurse Consult Note: patient found down in a bathtub  Reason for Consult: multiple pressure injuries  Wound type: 1.  Moisture Associated Skin Damage buttocks  ICD-10 CM Codes for Irritant Dermatitis  L24A2 - Due to fecal, urinary or dual incontinence 2. Stage 2 Pressure Injury buttocks  3. Full thickness R upper back , R upper arm, R elbow, L arm  4.  Unstageable Pressure Injury R hip (trochanter)  5.  Deep tissue Pressure Injury L ribs, R shoulder Pressure Injury POA: Yes Measurement: see nursing flowsheet  Wound bed: 1.  Widespread erythema with partial thickness skin loss and scattered Stage 2 pressure injuries to buttocks  2.  R mid upper back 100% yellow, R elbow 75% yellow brown 25% pink dry, R upper arm 100% yellow, L arm 25% yellow 75% pink dry  3. Unstageable PI R trochanter 100% brown yellow  4.  L rib and R shoulder red purple maroon discoloration with some partial thickness skin loss  Drainage (amount, consistency, odor) see nursing flowsheet  Periwound: Dressing procedure/placement/frequency:   Cleanse buttocks/sacrum/posterior thighs (intact and open wounds) with Vashe wound cleanser Hart [place] [phone]), do not rinse and allow to air dry.  Apply Gerhardt's Butt Cream to entire area 3 times a day and prn soiling. May cover with silicone foam or ABD pad and foam as desired.  Cleanse R mid/upper back, R elbow, R upper arm, L arm wounds with Vashe wound cleanser Hart [place] [phone]), do not rinse and allow to air dry. Apply Medihoney to wound beds daily, cover with dry gauze and silicone foam or Kerlix roll gauze whichever preferred.  Cleanse R hip wound with Vashe wound cleanser Hart Rochester [phone]), do not rinse and allow to air dry. Apply Medihoney to wound bed daily, cover with dry gauze and silicone foam or ABD pad whichever is preferred.  Cleanse L rib cage and R shoulder areas of red purple discoloration with Vashe cleanser Hart [place] [phone]), do not rinse and allow to air dry. Apply  Xeroform gauze Hart Rochester [phone]) to areas daily, cover with ABD pad or silicone foam whichever is preferred.  Patient would benefit from a low air loss mattress throughout hospitalization for moisture management and pressure redistribution.   POC discussed with bedside nurse. WOC team will not follow. Re-consult if further needs arise.   Thank you    Priscella Mann MSN, RN-BC, [employer] [phone]

## 2023-04-14 NOTE — Progress Notes (Signed)
 Daily Progress Note   Patient Name: Dean White       Date: 04/14/2023 DOB: [birth year]  Age: 89 y.o. MRN#: [id number] Attending Physician: Raechel Chute, MD Primary Care Physician: Corky Downs, MD Admit Date: 04/12/2023  Reason for Consultation/Follow-up: Establishing goals of care  Subjective: Notes and labs reviewed.  Into see patient.  Patient is resting in bed on ventilator support.  Met with patient's daughter Dean White and family meeting room.  Dean White discusses her mother's death in addition to other family members deaths.  She states when her mother died, her father shut down and did not really say anything.  Dean White states she made the decisions on her mother's care until death.   She states they have talked about his moving back up to [place], but he does not want to sell sentimental items such as furniture.  She states the family members in [place] are not sentimental about these positions.  She discusses that here in [place], patient normally lives alone and is fully independent.  She states he has 5 cars, and will pull 1 out every week, wash wax and cleaned it up, and drive it for the week.  She discusses the circumstances in which she was found.  She discusses her feelings.  She discusses her siblings.  We discussed the importance of honoring a person's wishes.  She discusses her father's faith in God.  She discusses that her father is a IT sales professional.  She states she does not want her father to suffer.  We talked about different scenarios and acceptable quality of life.   She discusses CODE STATUS.  She states she reversed CODE STATUS from DNR back to full code.  She says after talking to CCM, she is looking to place him back with a DNR status but wants to speak with a  family member who is a Publishing rights manager prior to doing so.  Dean White states her brother will be coming to the hospital on Thursday and they will talk further about care moving forward.  PMT will follow.  Length of Stay: 2  Current Medications: Scheduled Meds:   Chlorhexidine Gluconate Cloth  6 each Topical Q2200   docusate  100 mg Per Tube BID   dorzolamide-timolol  1 drop Both Eyes BID   free water  30 mL  Per Tube Q4H   Gerhardt's butt cream   Topical TID   heparin  5,000 Units Subcutaneous Q8H   hydrocortisone sod succinate (SOLU-CORTEF) inj  100 mg Intravenous Q12H   ipratropium-albuterol  3 mL Nebulization Q6H   latanoprost  1 drop Both Eyes QHS   leptospermum manuka honey  1 Application Topical Daily   mupirocin ointment  1 Application Nasal BID   [START ON 04/15/2023] nutrition supplement (JUVEN)  1 packet Per Tube BID BM   mouth rinse  15 mL Mouth Rinse Q2H   pantoprazole (PROTONIX) IV  40 mg Intravenous QHS   polyethylene glycol  17 g Per Tube Daily    Continuous Infusions:  sodium chloride 10 mL/hr at 04/14/23 1100   sodium chloride 150 mL/hr at 04/14/23 1500   sodium chloride Stopped (04/14/23 1339)   azithromycin Stopped (04/13/23 2101)   feeding supplement (VITAL AF 1.2 CAL) 1,000 mL (04/14/23 1529)   norepinephrine (LEVOPHED) Adult infusion 7 mcg/min (04/14/23 1500)   piperacillin-tazobactam (ZOSYN)  IV 12.5 mL/hr at 04/14/23 1500   vancomycin Stopped (04/13/23 2333)    PRN Meds: acetaminophen **OR** acetaminophen, docusate, fentaNYL (SUBLIMAZE) injection, fentaNYL (SUBLIMAZE) injection, HYDROcodone-acetaminophen, ipratropium-albuterol, ondansetron **OR** ondansetron (ZOFRAN) IV, mouth rinse, polyethylene glycol  Physical Exam Constitutional:      Comments: Eyes closed  Pulmonary:     Comments: On ventilator support            Vital Signs: BP (!) 104/51   Pulse (!) 53   Temp 98.8 F (37.1 C)   Resp 18   Ht 5\' 7"  (1.702 m)   Wt 68.1 kg   SpO2 100%    BMI 23.51 kg/m  SpO2: SpO2: 100 % O2 Device: O2 Device: Ventilator O2 Flow Rate: O2 Flow Rate (L/min): 2 L/min  Intake/output summary:  Intake/Output Summary (Last 24 hours) at 04/14/2023 1623 Last data filed at 04/14/2023 1500 Gross per 24 hour  Intake 7625.58 ml  Output 2495 ml  Net 5130.58 ml   LBM: Last BM Date : 04/14/23 Baseline Weight: Weight: 65.8 kg Most recent weight: Weight: 68.1 kg   Patient Active Problem List   Diagnosis Date Noted   Pressure injury of deep tissue 04/13/2023   Respiratory failure (HCC) 04/13/2023   Acute hypoxic respiratory failure (HCC) 04/13/2023   Hypothermia due to exposure 04/12/2023   Rhabdomyolysis 04/12/2023   Severe sepsis (HCC) 04/12/2023   Hyponatremia 04/12/2023   Acute metabolic encephalopathy 04/12/2023   Pneumonia, suspect aspiration 04/12/2023   Abnormal LFTs 04/12/2023   Fall in bathtub, partially filled 04/12/2023   Primary insomnia 06/27/2021   Hip pain 01/30/2021   Idiopathic hypotension 01/30/2021   HLD (hyperlipidemia)    Hypertensive heart disease with diastolic congestive heart failure (HCC) 12/28/2019   Annual physical exam 12/28/2019   BPH with obstruction/lower urinary tract symptoms    Essential hypertension 10/26/2019   Need for influenza vaccination 10/26/2019   Acute right hip pain 10/26/2019   Paget's bone disease 06/29/2019   Meatal stenosis 10/06/2014   Adenocarcinoma of prostate (HCC) 07/10/2014   Secondary malignant neoplasm of bone (HCC) 11/26/2012    Palliative Care Assessment & Plan    Recommendations/Plan: PMT will follow    Code Status:    Code Status Orders  (From admission, onward)           Start     Ordered   04/13/23 2237  Full code  (Code Status)  Continuous       Question:  By:  Answer:  Consent: discussion documented in EHR   04/13/23 2237           Code Status History     Date Active Date Inactive Code Status Order ID Comments User Context   04/13/2023 1845  04/13/2023 2237 Do not attempt resuscitation (DNR) PRE-ARREST INTERVENTIONS DESIRED [id number]  Erin Fulling, MD ED   04/13/2023 1842 04/13/2023 1845 Full Code [id number]  Andreas Ohm, NP ED   04/12/2023 2241 04/13/2023 1842 Full Code [id number]  Andris Baumann, MD ED       Thank you for allowing the Palliative Medicine Team to assist in the care of this patient.   Morton Stall, NP  Please contact Palliative Medicine Team phone at [phone] for questions and concerns.

## 2023-04-14 NOTE — Plan of Care (Signed)
  Problem: Fluid Volume: Goal: Hemodynamic stability will improve Outcome: Progressing   Problem: Clinical Measurements: Goal: Diagnostic test results will improve Outcome: Progressing Goal: Signs and symptoms of infection will decrease Outcome: Progressing   Problem: Respiratory: Goal: Ability to maintain adequate ventilation will improve Outcome: Progressing   Problem: Clinical Measurements: Goal: Ability to maintain clinical measurements within normal limits will improve Outcome: Progressing Goal: Will remain free from infection Outcome: Progressing Goal: Diagnostic test results will improve Outcome: Progressing Goal: Respiratory complications will improve Outcome: Progressing Goal: Cardiovascular complication will be avoided Outcome: Progressing   Problem: Activity: Goal: Risk for activity intolerance will decrease Outcome: Progressing

## 2023-04-14 NOTE — IPAL (Signed)
  Interdisciplinary Goals of Care Family Meeting   Date carried out: 04/14/2023  Location of the meeting: Bedside  Member's involved: Physician and Family Member or next of kin  Durable Power of Attorney or acting medical decision maker: Daughter, Izaiyah Kleinman.  Discussion: We discussed goals of care for Dean White .  I met with Mr. Perazzo daughter today and we went over his plan of care as well as condition. Daughter very well informed about Mr. Waddell condition, and understands the critical nature of the illness. We went over his respiratory failure, hypotension, rhabdomyolysis, and altered mental status. She has had multiple discussions with providers over the past 24 hours regarding code status and we again went over it. She was under the impression that DNR meant "giving up" on the patient. Explained to her that it strictly means we would not do chest compressions if his heart were to stop. She is going to discuss with her brothers today so they are all on the same page regarding goals of care. Explained that next steps of medical care that will include ventilator support, antibiotics, and allowing time for mental status to improve. We discussed that we will check in daily, and will revisit the conversation around code status and goals of care tomorrow.  Code status:   Code Status: Full Code   Disposition: Continue current acute care  Time spent for the meeting: 25 minutes    Raechel Chute, MD  04/14/2023, 3:11 PM

## 2023-04-14 NOTE — Progress Notes (Signed)
 NAME:  Dean White, MRN:  [id number], DOB:  [birth year], LOS: 2 ADMISSION DATE:  04/12/2023, CHIEF COMPLAINT:  Respiratory Failure, AMS   History of Present Illness:   89 y.o. 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     Nephew called  911 after they had not seen him in a couple days.  He was found lying in the bathtub, partially filled with water with excrement.     Initially He was awake, oriented to name.   Hypothermic with temp of 86 and hypoxic with EMS, brought in on NRB.  History was initially provided by his daughter and POA Edna Grover over the phone.   At baseline he is independent in ADLs and IADLs and does everything himself including cooking and driving to his doctors appointments.   She did state that his hip had been giving him some trouble and while he loves to take baths, she had stopped him from soaking in the tub because he was having increasing difficulty getting out of the bathtub.  He agreed to only take showers, but he refused to use a shower stool as she recommended.     ED course and data review:  Temp 86.6 on arrival improving to 90.4 with Bair hugger at the time of admission.  Heart rate in the 60s to 70s, tachypneic to the teens, initial O2 sat 93% on NRB, weaned to O2 at 2 L and maintaining sats in the low to mid 90s. Notable labs: WBC normal at 5400 With lactic acid 3.6--2.6 Sodium 129, potassium 3.2, chloride 96 BUN/creatinine 47/0.7, bicarb 20, 5 ketones in urine Elevated LFTs with AST 111, ALT 85 and total bili 1.3. normal alk phos CK13 45 Hemoglobin 15 and CBC otherwise normal Troponin 12, INR 1.6 Calcium 8.2, total protein 4.8 and albumin 2.6 UA not consistent with infection EKG, personally viewed and  interpreted showing NSR at 55 with nonspecific T wave changes Imaging CT head and C-spine nonacute but showing groundglass opacity right upper lobe Chest x-ray showing minimal right basilar subsegmental atelectasis and small pleural effusion  PCCM consulted for evaluation of hypotension as well as increased work of breath. He was noted to be somnolent and minimally responsive to verbal or tactili stimuli. He was noted to be hypotensive as well as hypoglycemic. He was intubated at the bedside secondary to his encephalopathy.  Pertinent  Medical History  -prostate Ca -paget's disease  Significant Hospital Events: Including procedures, antibiotic start and stop dates in addition to other pertinent events   04/12/2023: admission 04/13/2023: PCCM consult, intubated 04/14/2023: minimally responsive, off sedation this AM  Interim History / Subjective:  No acute events overnight, sedation held this AM. MRSA screen negative, but rest of infectious workup non-revealing.  Objective   Blood pressure 124/66, pulse 64, temperature 98.6 F (37 C), temperature source Bladder, resp. rate 17, height 5\' 7"  (1.702 m), weight 68.1 kg, SpO2 98%.    Vent Mode: PRVC FiO2 (%):  [60 %-100 %] 60 % Set Rate:  [20 bmp] 20 bmp Vt Set:  [500 mL] 500 mL PEEP:  [5 cmH20] 5 cmH20   Intake/Output Summary (Last 24 hours) at 04/14/2023 1321 Last data filed at 04/14/2023 1000 Gross per 24 hour  Intake 6631.89  ml  Output 2275 ml  Net 4356.89 ml   Filed Weights   04/12/23 1737 04/13/23 2130 04/14/23 0400  Weight: 65.8 kg 67.8 kg 68.1 kg    Examination: Physical Exam Constitutional:      General: He is not in acute distress.    Appearance: He is ill-appearing.  Cardiovascular:     Rate and Rhythm: Normal rate and regular rhythm.     Pulses: Normal pulses.  Pulmonary:     Breath sounds: No wheezing or rhonchi.     Comments: Ventilated breath sounds bilaterally Abdominal:     General: Abdomen is flat.      Palpations: Abdomen is soft.  Neurological:     Mental Status: He is disoriented.     Comments: Sedated, unresponsive.       Assessment & Plan:   #Toxic Metabolic Encephalopathy #Acute Hypoxic Respiratory Failure #Aspiration Pneumonia  Right Lower Lobe Pneumonia #Sepsis #Rhabdomyolysis #Hypoglycemia  Neuro: presented with AMS after being found down at home, intubated for encephalopathy. Initially sedated with dexmedetomidine, now held as we do wake up assessments. Will attempt to minimize psychotropics meds. Both CT and MRI of the brain were unremarkable and without acute findings. CV: Hypotensive yesterday and has required low dose vasopressor support. Initially on phenylephrine but we've switched this to nor-epinephrine. Suspect some of his hypotension was sedation related, but cannot rule out sepsis with the pneumonia in the RLL. Lactic acid was mildly elevated but has improved on repeat after fluid resuscitation (received boluses on presentation and yesterday). -check echo Pulm: Respiratory failure secondary to RLL aspiration pneumonia as well as altered mental status. I have personally reviewed his CT of the abdomen and note the PNA on the chest windows. Continued on vent support, and will attempt SBT's as tolerated and with improvement in mental status GI: switching SUP to PPI, initiate tube feeds Renal: CK's elevated on presentation, with peak yesterday. He's received generous IV fluids over the past two days, with no sign of kidney function impairment. Continue to trend/monitor CK and kidney function, and will initiate gentle fluids. -LR at 50 mL/hour Endo: ICU glycemic protocol, was hypoglycemic on presentation so we we will monitor this closely. Also on adjunct steroids for pneumonia with hydrocortisone. Hem/Onc: Heparin subQ for DVT prophylaxis ID: RLL aspiration pneumonia which we will treat with broad spectrum antibiotics, covering for MRSA (positive in nares) and legionella.  Currently on Pip/Tazo, Vancomycin, and azithromycin. Checking respiratory cultures and urine antigens (legionella/strep pneumo).  Best Practice (right click and "Reselect all SmartList Selections" daily)   Diet/type: tubefeeds DVT prophylaxis prophylactic heparin  Pressure ulcer(s): present on admission  GI prophylaxis: PPI Lines: N/A Foley:  Yes, and it is still needed Code Status:  full code Last date of multidisciplinary goals of care discussion [04/14/2023]  Labs   CBC: Recent Labs  Lab 04/12/23 1745 04/13/23 0522 04/14/23 0413  WBC 5.4 4.1 15.8*  NEUTROABS 4.7  --   --   HGB 15.7 14.3 14.6  HCT 47.1 42.5 43.5  MCV 91.3 90.8 90.8  PLT 189 170 186    Basic Metabolic Panel: Recent Labs  Lab 04/12/23 1745 04/13/23 0522 04/13/23 2040 04/14/23 0413  NA 129* 133* 136 139  K 3.2* 3.2* 3.2* 3.8  CL 96* 102 104 110  CO2 20* 19* 15* 18*  GLUCOSE 86 54* 77 93  BUN 47* 41* 42* 35*  CREATININE 0.70 0.80 1.08 0.88  CALCIUM 8.2* 7.6* 8.0* 8.2*  MG  --   --  2.1 2.1  PHOS  --   --  3.1 2.7   GFR: Estimated Creatinine Clearance: 50.1 mL/min (by C-G formula based on SCr of 0.88 mg/dL). Recent Labs  Lab 04/12/23 1739 04/12/23 1745 04/12/23 1939 04/13/23 0522 04/13/23 2040 04/13/23 2344 04/14/23 0413  PROCALCITON 0.25  --   --   --   --   --   --   WBC  --  5.4  --  4.1  --   --  15.8*  LATICACIDVEN  --  3.6* 2.6*  --  2.7* 1.9  --     Liver Function Tests: Recent Labs  Lab 04/12/23 1745 04/13/23 0522 04/13/23 2040 04/14/23 0413  AST 111* 92* 129* 126*  ALT 85* 73* 83* 85*  ALKPHOS 52 40 44 45  BILITOT 1.3* 1.2 1.0 1.0  PROT 4.8* 4.2* 4.4* 4.5*  ALBUMIN 2.6* 2.2* 2.3* 2.2*   No results for input(s): "LIPASE", "AMYLASE" in the last 168 hours. No results for input(s): "AMMONIA" in the last 168 hours.  ABG    Component Value Date/Time   PHART 7.37 04/13/2023 1930   PCO2ART 33 04/13/2023 1930   PO2ART 84 04/13/2023 1930   HCO3 19.1 (L) 04/13/2023 1930    ACIDBASEDEF 5.2 (H) 04/13/2023 1930   O2SAT 97.7 04/13/2023 1930     Coagulation Profile: Recent Labs  Lab 04/12/23 1745  INR 1.6*    Cardiac Enzymes: Recent Labs  Lab 04/12/23 1745 04/13/23 0522 04/13/23 2040 04/14/23 0413  CKTOTAL 1,345* 556* 2,756* 2,283*    HbA1C: No results found for: "HGBA1C"  CBG: Recent Labs  Lab 04/13/23 2339 04/14/23 0353 04/14/23 0754 04/14/23 1131 04/14/23 1212  GLUCAP 89 86 91 96 106*    Review of Systems:   Unable to obtain  Past Medical History:  He,  has a past medical history of Adenocarcinoma of prostate (HCC), BPH with obstruction/lower urinary tract symptoms, HLD (hyperlipidemia), and Paget disease of bone.   Surgical History:   Past Surgical History:  Procedure Laterality Date   CHOLECYSTECTOMY     PROSTATE BIOPSY     PROSTATE SURGERY       Social History:   reports that he has quit smoking. He has never used smokeless tobacco. He reports that he does not drink alcohol and does not use drugs.   Family History:  His family history includes Breast cancer in his sister; Cancer in his brother and son. There is no history of Kidney disease or Prostate cancer.   Allergies Allergies  Allergen Reactions   Aspirin Other (See Comments)    Nose bleeds     Home Medications  Prior to Admission medications   Medication Sig Start Date End Date Taking? Authorizing Provider  cholecalciferol (VITAMIN D) 1000 UNITS tablet Take 1,000 Units by mouth daily.   Yes [provider]  dorzolamide-timolol (COSOPT) 2-0.5 % ophthalmic solution Place 1 drop into both eyes 2 (two) times daily. 03/07/23  Yes [provider]  ibuprofen (ADVIL) 800 MG tablet Take 800 mg by mouth every 8 (eight) hours as needed for moderate pain (pain score 4-6).   Yes [provider]  latanoprost (XALATAN) 0.005 % ophthalmic solution place 1 drop into both eyes at bedtime 05/12/14  Yes [provider]  lovastatin (MEVACOR)  40 MG tablet TAKE 1 TABLET BY MOUTH DAILY 10/05/21  Yes Corky Downs, MD  Misc Natural Products (PROSTATE HEALTH PO) Take by mouth.   Yes [provider]  Multiple Vitamin (MULTIVITAMIN WITH  MINERALS) TABS tablet Take 1 tablet by mouth daily.   Yes [provider]  Leuprolide Acetate, 6 Month, (LUPRON) 45 MG injection Inject 45 mg into the muscle every 6 (six) months. Patient not taking: Reported on 04/12/2023    [provider]     Critical care time: 50 minutes    Raechel Chute, MD [facility] 04/14/2023 1:36 PM

## 2023-04-14 NOTE — Progress Notes (Signed)
 Initial Nutrition Assessment  DOCUMENTATION CODES:   Non-severe (moderate) malnutrition in context of chronic illness  INTERVENTION:   Vital 1.2@60ml /hr- Initiate at 36ml/hr and increase by 94ml/hr q 8 hours until goal rate is reached.   Free water flushes 30ml q4 hours to maintain tube patency   Regimen provides 1728kcal/day, 108g/day protein and 1327ml/day of free water.   Pt at high refeed risk; recommend monitor potassium, magnesium and phosphorus labs daily until stable  Daily weights  Juven Fruit Punch BID via tube, each serving provides 95kcal and 2.5g of protein (amino acids glutamine and arginine)  NUTRITION DIAGNOSIS:   Moderate Malnutrition related to cancer and cancer related treatments as evidenced by moderate fat depletion, moderate muscle depletion.  GOAL:   Provide needs based on ASPEN/SCCM guidelines  MONITOR:   Vent status, Labs, Weight trends, TF tolerance, I & O's, Skin  REASON FOR ASSESSMENT:   Ventilator    ASSESSMENT:   89 y.o. 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  Pt sedated and ventilated. OGT in place. Will plan to initiate tube feeds today. Pt is at high refeed risk. Per chart, pt appears weight stable pta.   Medications reviewed and include: colace, heparin, solu-cortef, protonix, miralax, azithromycin, levophed, zosyn, vancomycin   Labs reviewed: K 3.8 wnl, P 2.7 wnl, Mg 2.1 wnl Wbc- 15.8(H) Cbgs- 106, 96, 91, 86 x 24 hrs   Patient is currently intubated on ventilator support MV: 12.4 L/min Temp (24hrs), Avg:98.4 F (36.9 C), Min:97.3 F (36.3 C), Max:99 F (37.2 C)  MAP >65mmHg   UOP-   NUTRITION - FOCUSED PHYSICAL EXAM:  Flowsheet Row Most Recent Value  Orbital Region Moderate depletion  Upper Arm Region Moderate depletion   Thoracic and Lumbar Region Mild depletion  Buccal Region Mild depletion  Temple Region Moderate depletion  Clavicle Bone Region Moderate depletion  Clavicle and Acromion Bone Region Moderate depletion  Scapular Bone Region Moderate depletion  Dorsal Hand Moderate depletion  Patellar Region Moderate depletion  Anterior Thigh Region Moderate depletion  Posterior Calf Region Moderate depletion  Edema (RD Assessment) Mild  Hair Reviewed  Eyes Reviewed  Mouth Reviewed  Skin Reviewed  Nails Reviewed   Diet Order:   Diet Order             Diet NPO time specified  Diet effective now                  EDUCATION NEEDS:   No education needs have been identified at this time  Skin:  Skin Assessment: Reviewed RN Assessment (MASD, stage 2 PI buttocks, full thickness R upper back , R upper arm, R elbow, L arm, unstageable pressure injury R hip, deep tissue pressure injury L ribs, R shoulder)  Last BM:  3/31- type 7  Height:   Ht Readings from Last 1 Encounters:  04/13/23 5\' 7"  (1.702 m)    Weight:   Wt Readings from Last 1 Encounters:  04/14/23 68.1 kg    Ideal Body Weight:  67.2 kg  BMI:  Body mass index is 23.51 kg/m.  Estimated Nutritional Needs:   Kcal:  1630kcal/day  Protein:  100-115g/day  Fluid:  1.7-2.0L/day  Betsey Holiday MS, RD, LDN If unable to be reached, please send secure chat to "RD inpatient" available from 8:00a-4:00p daily

## 2023-04-14 NOTE — Progress Notes (Signed)
 PHARMACY CONSULT NOTE - ELECTROLYTES  Pharmacy Consult for Electrolyte Monitoring and Replacement   Recent Labs: Height: 5\' 7"  (170.2 cm) Weight: 68.1 kg (150 lb 2.1 oz) IBW/kg (Calculated) : 66.1 Estimated Creatinine Clearance: 50.1 mL/min (by C-G formula based on SCr of 0.88 mg/dL). Potassium (mmol/L)  Date Value  04/14/2023 3.8   Magnesium (mg/dL)  Date Value  40/98/1191 2.1   Calcium (mg/dL)  Date Value  47/82/9562 8.2 (L)   Albumin (g/dL)  Date Value  13/08/6576 2.2 (L)   Phosphorus (mg/dL)  Date Value  46/96/2952 2.7   Sodium (mmol/L)  Date Value  04/14/2023 139   Corrected Ca: 9 mg/dL  Assessment  Dean White is a 89 y.o. male presenting with severe sepsis. PMH significant for prostate cancer on leuprolide, HTN, and Paget's disease of the bone with chronic hip pain and mild hearing impairment. Pharmacy has been consulted to monitor and replace electrolytes.  Goal of Therapy: Electrolytes WNL  Plan:  No electrolyte replacement warranted for today Check BMP, Mg, Phos with AM labs  Thank you for allowing pharmacy to be a part of this patient's care.  Lowella Bandy, PharmD Clinical Pharmacist 04/14/2023 7:05 AM

## 2023-04-14 NOTE — Plan of Care (Signed)
  Problem: Respiratory: Goal: Ability to maintain adequate ventilation will improve Outcome: Progressing   Problem: Pain Managment: Goal: General experience of comfort will improve and/or be controlled Outcome: Progressing   Problem: Safety: Goal: Ability to remain free from injury will improve Outcome: Progressing   Problem: Skin Integrity: Goal: Risk for impaired skin integrity will decrease Outcome: Not Progressing

## 2023-04-15 ENCOUNTER — Inpatient Hospital Stay (HOSPITAL_COMMUNITY)
Admit: 2023-04-15 | Discharge: 2023-04-15 | Disposition: A | Discharge status: Home / Self Care | Attending: Pulmonary Disease

## 2023-04-15 DIAGNOSIS — A419 Sepsis, unspecified organism: Secondary | ICD-10-CM | POA: Diagnosis not present

## 2023-04-15 DIAGNOSIS — R55 Syncope and collapse: Secondary | ICD-10-CM

## 2023-04-15 DIAGNOSIS — R6520 Severe sepsis without septic shock: Secondary | ICD-10-CM | POA: Diagnosis not present

## 2023-04-15 DIAGNOSIS — Z7189 Other specified counseling: Secondary | ICD-10-CM | POA: Diagnosis not present

## 2023-04-15 DIAGNOSIS — G928 Other toxic encephalopathy: Secondary | ICD-10-CM | POA: Diagnosis not present

## 2023-04-15 DIAGNOSIS — J9601 Acute respiratory failure with hypoxia: Secondary | ICD-10-CM | POA: Diagnosis not present

## 2023-04-15 DIAGNOSIS — J690 Pneumonitis due to inhalation of food and vomit: Secondary | ICD-10-CM | POA: Diagnosis not present

## 2023-04-15 DIAGNOSIS — Z789 Other specified health status: Secondary | ICD-10-CM

## 2023-04-15 LAB — ECHOCARDIOGRAM COMPLETE
AR max vel: 2.76 cm2
AV Area VTI: 3.26 cm2
AV Area mean vel: 2.52 cm2
AV Mean grad: 4 mmHg
AV Peak grad: 7.7 mmHg
Ao pk vel: 1.39 m/s
Area-P 1/2: 5.7 cm2
Height: 67 in
MV VTI: 3.68 cm2
S' Lateral: 2.5 cm
Weight: 1181.67 [oz_av]

## 2023-04-15 LAB — CBC WITH DIFFERENTIAL/PLATELET
Abs Immature Granulocytes: 0.47 10*3/uL — ABNORMAL HIGH (ref 0.00–0.07)
Basophils Absolute: 0.1 10*3/uL (ref 0.0–0.1)
Basophils Relative: 1 %
Eosinophils Absolute: 0 10*3/uL (ref 0.0–0.5)
Eosinophils Relative: 0 %
HCT: 38 % — ABNORMAL LOW (ref 39.0–52.0)
Hemoglobin: 12.6 g/dL — ABNORMAL LOW (ref 13.0–17.0)
Immature Granulocytes: 3 %
Lymphocytes Relative: 3 %
Lymphs Abs: 0.5 10*3/uL — ABNORMAL LOW (ref 0.7–4.0)
MCH: 30.4 pg (ref 26.0–34.0)
MCHC: 33.2 g/dL (ref 30.0–36.0)
MCV: 91.6 fL (ref 80.0–100.0)
Monocytes Absolute: 1 10*3/uL (ref 0.1–1.0)
Monocytes Relative: 7 %
Neutro Abs: 12.5 10*3/uL — ABNORMAL HIGH (ref 1.7–7.7)
Neutrophils Relative %: 86 %
Platelets: 170 10*3/uL (ref 150–400)
RBC: 4.15 MIL/uL — ABNORMAL LOW (ref 4.22–5.81)
RDW: 13.5 % (ref 11.5–15.5)
Smear Review: NORMAL
WBC: 14.5 10*3/uL — ABNORMAL HIGH (ref 4.0–10.5)
nRBC: 0 % (ref 0.0–0.2)

## 2023-04-15 LAB — POTASSIUM: Potassium: 3.2 mmol/L — ABNORMAL LOW (ref 3.5–5.1)

## 2023-04-15 LAB — COMPREHENSIVE METABOLIC PANEL WITH GFR
ALT: 77 U/L — ABNORMAL HIGH (ref 0–44)
AST: 86 U/L — ABNORMAL HIGH (ref 15–41)
Albumin: 2 g/dL — ABNORMAL LOW (ref 3.5–5.0)
Alkaline Phosphatase: 48 U/L (ref 38–126)
Anion gap: 6 (ref 5–15)
BUN: 28 mg/dL — ABNORMAL HIGH (ref 8–23)
CO2: 19 mmol/L — ABNORMAL LOW (ref 22–32)
Calcium: 8.2 mg/dL — ABNORMAL LOW (ref 8.9–10.3)
Chloride: 118 mmol/L — ABNORMAL HIGH (ref 98–111)
Creatinine, Ser: 0.82 mg/dL (ref 0.61–1.24)
GFR, Estimated: >60 mL/min (ref >60)
Glucose, Bld: 160 mg/dL — ABNORMAL HIGH (ref 70–99)
Potassium: 2.9 mmol/L — ABNORMAL LOW (ref 3.5–5.1)
Sodium: 143 mmol/L (ref 135–145)
Total Bilirubin: 0.7 mg/dL (ref 0.0–1.2)
Total Protein: 4.1 g/dL — ABNORMAL LOW (ref 6.5–8.1)

## 2023-04-15 LAB — CK: Total CK: 1138 U/L — ABNORMAL HIGH (ref 49–397)

## 2023-04-15 LAB — GLUCOSE, CAPILLARY
Glucose-Capillary: 133 mg/dL — ABNORMAL HIGH (ref 70–99)
Glucose-Capillary: 138 mg/dL — ABNORMAL HIGH (ref 70–99)
Glucose-Capillary: 117 mg/dL — ABNORMAL HIGH (ref 70–99)
Glucose-Capillary: 132 mg/dL — ABNORMAL HIGH (ref 70–99)
Glucose-Capillary: 125 mg/dL — ABNORMAL HIGH (ref 70–99)
Glucose-Capillary: 117 mg/dL — ABNORMAL HIGH (ref 70–99)

## 2023-04-15 LAB — PHOSPHORUS
Phosphorus: 1.6 mg/dL — ABNORMAL LOW (ref 2.5–4.6)
Phosphorus: 3.7 mg/dL (ref 2.5–4.6)

## 2023-04-15 LAB — MAGNESIUM: Magnesium: 2.2 mg/dL (ref 1.7–2.4)

## 2023-04-15 MED ORDER — MORPHINE SULFATE (PF) 2 MG/ML IV SOLN
1.0000 mg | Freq: Once | INTRAVENOUS | Status: AC
  Administered 2023-04-15: 1 mg via INTRAVENOUS
  Filled 2023-04-15: qty 1

## 2023-04-15 MED ORDER — ONDANSETRON HCL 4 MG PO TABS: 4.0000 mg | ORAL_TABLET | Freq: Four times a day (QID) | ORAL | Status: DC | PRN

## 2023-04-15 MED ORDER — FENTANYL CITRATE PF 50 MCG/ML IJ SOSY: 50.0000 ug | PREFILLED_SYRINGE | Freq: Once | INTRAMUSCULAR | Status: DC

## 2023-04-15 MED ORDER — POTASSIUM CHLORIDE 10 MEQ/100ML IV SOLN
10.0000 meq | INTRAVENOUS | Status: AC
  Administered 2023-04-15 – 2023-04-16 (×6): 10 meq via INTRAVENOUS
  Filled 2023-04-15 (×6): qty 100

## 2023-04-15 MED ORDER — ACETAMINOPHEN 650 MG RE SUPP: 650.0000 mg | Freq: Four times a day (QID) | RECTAL | Status: DC | PRN

## 2023-04-15 MED ORDER — POTASSIUM PHOSPHATES 15 MMOLE/5ML IV SOLN
30.0000 mmol | Freq: Once | INTRAVENOUS | Status: AC
  Administered 2023-04-15: 30 mmol via INTRAVENOUS
  Filled 2023-04-15: qty 10

## 2023-04-15 MED ORDER — MIDAZOLAM HCL 2 MG/2ML IJ SOLN: 2.0000 mg | Freq: Once | INTRAMUSCULAR | Status: DC

## 2023-04-15 MED ORDER — LABETALOL HCL 5 MG/ML IV SOLN
10.0000 mg | INTRAVENOUS | Status: DC | PRN
  Administered 2023-04-15: 10 mg via INTRAVENOUS
  Filled 2023-04-15 (×2): qty 4

## 2023-04-15 MED ORDER — AMIODARONE IV BOLUS ONLY 150 MG/100ML
INTRAVENOUS | Status: AC
  Administered 2023-04-15: 150 mg
  Filled 2023-04-15: qty 100

## 2023-04-15 MED ORDER — HYDROCODONE-ACETAMINOPHEN 5-325 MG PO TABS: 1.0000 | ORAL_TABLET | ORAL | Status: DC | PRN

## 2023-04-15 MED ORDER — AMIODARONE IV BOLUS ONLY 150 MG/100ML
150.0000 mg | Freq: Once | INTRAVENOUS | Status: AC
  Administered 2023-04-15: 150 mg via INTRAVENOUS

## 2023-04-15 MED ORDER — ONDANSETRON HCL 4 MG/2ML IJ SOLN
4.0000 mg | Freq: Four times a day (QID) | INTRAMUSCULAR | Status: DC | PRN
Start: 2023-04-15 — End: 2023-04-17

## 2023-04-15 MED ORDER — ACETAMINOPHEN 325 MG PO TABS: 650.0000 mg | ORAL_TABLET | Freq: Four times a day (QID) | ORAL | Status: DC | PRN

## 2023-04-15 MED ORDER — HYDROCORTISONE SOD SUC (PF) 100 MG IJ SOLR
50.0000 mg | Freq: Two times a day (BID) | INTRAMUSCULAR | Status: DC
  Administered 2023-04-16: 50 mg via INTRAVENOUS
  Filled 2023-04-15: qty 2

## 2023-04-15 NOTE — Progress Notes (Signed)
 NAME:  Dean White, MRN:  [id number], DOB:  1928/07/28, LOS: 3 ADMISSION DATE:  04/12/2023, CHIEF COMPLAINT:  Respiratory Failure, AMS   History of Present Illness:   89 y.o. 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     Nephew called  911 after they had not seen him in a couple days.  He was found lying in the bathtub, partially filled with water with excrement.     Initially He was awake, oriented to name.   Hypothermic with temp of 86 and hypoxic with EMS, brought in on NRB.  History was initially provided by his daughter and POA Mckay Tegtmeyer over the phone.   At baseline he is independent in ADLs and IADLs and does everything himself including cooking and driving to his doctors appointments.   She did state that his hip had been giving him some trouble and while he loves to take baths, she had stopped him from soaking in the tub because he was having increasing difficulty getting out of the bathtub.  He agreed to only take showers, but he refused to use a shower stool as she recommended.     ED course and data review:  Temp 86.6 on arrival improving to 90.4 with Bair hugger at the time of admission.  Heart rate in the 60s to 70s, tachypneic to the teens, initial O2 sat 93% on NRB, weaned to O2 at 2 L and maintaining sats in the low to mid 90s. Notable labs: WBC normal at 5400 With lactic acid 3.6--2.6 Sodium 129, potassium 3.2, chloride 96 BUN/creatinine 47/0.7, bicarb 20, 5 ketones in urine Elevated LFTs with AST 111, ALT 85 and total bili 1.3. normal alk phos CK13 45 Hemoglobin 15 and CBC otherwise normal Troponin 12, INR 1.6 Calcium 8.2, total protein 4.8 and albumin 2.6 UA not consistent with infection EKG, personally viewed and  interpreted showing NSR at 55 with nonspecific T wave changes Imaging CT head and C-spine nonacute but showing groundglass opacity right upper lobe Chest x-ray showing minimal right basilar subsegmental atelectasis and small pleural effusion  PCCM consulted for evaluation of hypotension as well as increased work of breath. He was noted to be somnolent and minimally responsive to verbal or tactili stimuli. He was noted to be hypotensive as well as hypoglycemic. He was intubated at the bedside secondary to his encephalopathy.  Pertinent  Medical History  -prostate Ca -paget's disease  Significant Hospital Events: Including procedures, antibiotic start and stop dates in addition to other pertinent events   04/12/2023: admission 04/13/2023: PCCM consult, intubated 04/14/2023: minimally responsive, off sedation this AM 04/15/2023: remains encephalopathic, more agitated but non-purposeful, passed SBT  Interim History / Subjective:  No acute events overnight, sedation remains held. Daughter at the bedside.  Objective   Blood pressure (!) 137/94, pulse 93, temperature (!) 96.8 F (36 C), resp. rate (!) 23, height 5\' 7"  (1.702 m), weight 33.5 kg, SpO2 (!) 89%.    Vent Mode: PSV FiO2 (%):  [28 %] 28 % Set Rate:  [20 bmp] 20 bmp Vt Set:  [500 mL] 500 mL PEEP:  [5 cmH20] 5 cmH20 Pressure Support:  [5 cmH20] 5 cmH20   Intake/Output Summary (Last 24 hours) at 04/15/2023 1800 Last data filed  at 04/15/2023 1001 Gross per 24 hour  Intake 2373.89 ml  Output 990 ml  Net 1383.89 ml   Filed Weights   04/13/23 2130 04/14/23 0400 04/15/23 0500  Weight: 67.8 kg 68.1 kg 33.5 kg    Examination: Physical Exam Constitutional:      General: He is not in acute distress.    Appearance: He is ill-appearing.  Cardiovascular:     Rate and Rhythm: Normal rate and regular rhythm.     Pulses: Normal pulses.  Pulmonary:     Breath sounds: No wheezing or rhonchi.     Comments: Ventilated breath sounds  bilaterally Abdominal:     General: Abdomen is flat.     Palpations: Abdomen is soft.  Neurological:     Mental Status: He is disoriented.     Comments: Sedated, unresponsive.       Assessment & Plan:   #Toxic Metabolic Encephalopathy #Acute Hypoxic Respiratory Failure #Aspiration Pneumonia  Right Lower Lobe Pneumonia #Sepsis #Rhabdomyolysis #Hypoglycemia  Neuro: presented with AMS after being found down at home, intubated for encephalopathy. Initially sedated with dexmedetomidine, now held as we do wake up assessments. Both CT and MRI of the brain were unremarkable and without acute findings. CV: Hypotensive initially but he's been hypertensive today. Initiated labetalol PRN for hypertension. Previously on vasopressor support likely secondary to sepsis from RLL pneumonia. Pulm: Respiratory failure secondary to RLL aspiration pneumonia as well as altered mental status. I have personally reviewed his CT of the abdomen and note the PNA of the RLL on the chest windows. He is on antibiotics and improved, passing SBT today. Discussed with daughter at the bedside, palliative care similarly involved. Patient was extubated to nasal cannula today and code status changed to DNR. Continue with antibiotics and wait for his mental status to improve. GI: extubated, SLP once ready Renal: CK's elevated on presentation currently down trending. He's received generous IV fluids with no sign of kidney function impairment. Continue with gentle IV hydration, hold on further CK checks -LR at 50 mL/hour Endo: ICU glycemic protocol, was hypoglycemic on presentation so we we will monitor this closely. Also on adjunct steroids for pneumonia with hydrocortisone. Decrease hydrocortisone to 50 bid today, with plan for quick taper over the next few days. Hem/Onc: Heparin subQ for DVT prophylaxis ID: RLL aspiration pneumonia which we are treating with broad spectrum antibiotics, covering for MRSA (positive in nares)  and legionella. Currently on Pip/Tazo, Vancomycin, and azithromycin. Checking respiratory cultures and urine antigens (legionella/strep pneumo). Can narrow antibiotics once respiratory cultures from yesterday result/speciate.  Best Practice (right click and "Reselect all SmartList Selections" daily)   Diet/type: NPO DVT prophylaxis prophylactic heparin  Pressure ulcer(s): present on admission  GI prophylaxis: PPI Lines: N/A Foley:  Yes, and it is still needed Code Status:  full code Last date of multidisciplinary goals of care discussion [04/15/2023]  Labs   CBC: Recent Labs  Lab 04/12/23 1745 04/13/23 0522 04/14/23 0413 04/15/23 0421  WBC 5.4 4.1 15.8* 14.5*  NEUTROABS 4.7  --   --  12.5*  HGB 15.7 14.3 14.6 12.6*  HCT 47.1 42.5 43.5 38.0*  MCV 91.3 90.8 90.8 91.6  PLT 189 170 186 170    Basic Metabolic Panel: Recent Labs  Lab 04/12/23 1745 04/13/23 0522 04/13/23 2040 04/14/23 0413 04/15/23 0421  NA 129* 133* 136 139 143  K 3.2* 3.2* 3.2* 3.8 2.9*  CL 96* 102 104 110 118*  CO2 20* 19* 15* 18* 19*  GLUCOSE 86  54* 77 93 160*  BUN 47* 41* 42* 35* 28*  CREATININE 0.70 0.80 1.08 0.88 0.82  CALCIUM 8.2* 7.6* 8.0* 8.2* 8.2*  MG  --   --  2.1 2.1 2.2  PHOS  --   --  3.1 2.7 1.6*   GFR: Estimated Creatinine Clearance: 27.2 mL/min (by C-G formula based on SCr of 0.82 mg/dL). Recent Labs  Lab 04/12/23 1739 04/12/23 1745 04/12/23 1939 04/13/23 0522 04/13/23 2040 04/13/23 2344 04/14/23 0413 04/15/23 0421  PROCALCITON 0.25  --   --   --   --   --   --   --   WBC  --  5.4  --  4.1  --   --  15.8* 14.5*  LATICACIDVEN  --  3.6* 2.6*  --  2.7* 1.9  --   --     Liver Function Tests: Recent Labs  Lab 04/12/23 1745 04/13/23 0522 04/13/23 2040 04/14/23 0413 04/15/23 0421  AST 111* 92* 129* 126* 86*  ALT 85* 73* 83* 85* 77*  ALKPHOS 52 40 44 45 48  BILITOT 1.3* 1.2 1.0 1.0 0.7  PROT 4.8* 4.2* 4.4* 4.5* 4.1*  ALBUMIN 2.6* 2.2* 2.3* 2.2* 2.0*   No results for  input(s): "LIPASE", "AMYLASE" in the last 168 hours. No results for input(s): "AMMONIA" in the last 168 hours.  ABG    Component Value Date/Time   PHART 7.37 04/13/2023 1930   PCO2ART 33 04/13/2023 1930   PO2ART 84 04/13/2023 1930   HCO3 19.1 (L) 04/13/2023 1930   ACIDBASEDEF 5.2 (H) 04/13/2023 1930   O2SAT 97.7 04/13/2023 1930     Coagulation Profile: Recent Labs  Lab 04/12/23 1745  INR 1.6*    Cardiac Enzymes: Recent Labs  Lab 04/12/23 1745 04/13/23 0522 04/13/23 2040 04/14/23 0413 04/15/23 0421  CKTOTAL 1,345* 556* 2,756* 2,283* 1,138*    HbA1C: No results found for: "HGBA1C"  CBG: Recent Labs  Lab 04/14/23 2324 04/15/23 0322 04/15/23 0742 04/15/23 1205 04/15/23 1631  GLUCAP 130* 133* 138* 117* 132*    Review of Systems:   Unable to obtain  Past Medical History:  He,  has a past medical history of Adenocarcinoma of prostate (HCC), BPH with obstruction/lower urinary tract symptoms, HLD (hyperlipidemia), and Paget disease of bone.   Surgical History:   Past Surgical History:  Procedure Laterality Date   CHOLECYSTECTOMY     PROSTATE BIOPSY     PROSTATE SURGERY       Social History:   reports that he has quit smoking. He has never used smokeless tobacco. He reports that he does not drink alcohol and does not use drugs.   Family History:  His family history includes Breast cancer in his sister; Cancer in his brother and son. There is no history of Kidney disease or Prostate cancer.   Allergies Allergies  Allergen Reactions   Aspirin Other (See Comments)    Nose bleeds     Home Medications  Prior to Admission medications   Medication Sig Start Date End Date Taking? Authorizing Provider  cholecalciferol (VITAMIN D) 1000 UNITS tablet Take 1,000 Units by mouth daily.   Yes [provider]  dorzolamide-timolol (COSOPT) 2-0.5 % ophthalmic solution Place 1 drop into both eyes 2 (two) times daily. 03/07/23  Yes [provider]   ibuprofen (ADVIL) 800 MG tablet Take 800 mg by mouth every 8 (eight) hours as needed for moderate pain (pain score 4-6).   Yes [provider]  latanoprost (XALATAN) 0.005 %  ophthalmic solution place 1 drop into both eyes at bedtime 05/12/14  Yes [provider]  lovastatin (MEVACOR) 40 MG tablet TAKE 1 TABLET BY MOUTH DAILY 10/05/21  Yes Corky Downs, MD  Misc Natural Products (PROSTATE HEALTH PO) Take by mouth.   Yes [provider]  Multiple Vitamin (MULTIVITAMIN WITH MINERALS) TABS tablet Take 1 tablet by mouth daily.   Yes [provider]  Leuprolide Acetate, 6 Month, (LUPRON) 45 MG injection Inject 45 mg into the muscle every 6 (six) months. Patient not taking: Reported on 04/12/2023    [provider]     Critical care time: 42 minutes    Raechel Chute, MD [facility] 04/15/2023 6:00 PM

## 2023-04-15 NOTE — Progress Notes (Addendum)
 Daily Progress Note   Patient Name: Dean White       Date: 04/15/2023 DOB: Apr 28, 1928  Age: 89 y.o. MRN#: [id number] Attending Physician: Raechel Chute, MD Primary Care Physician: Corky Downs, MD Admit Date: 04/12/2023  Reason for Consultation/Follow-up: Establishing goals of care  Subjective: Notes and labs reviewed.  Into see patient.  He is currently resting in bed and appears restless as he is intubated but sedation is being held with SBT in progress.  Per CCM, discussion of one-way extubation and progress.  In to see patient.  Currently daughter is at bedside.  Daughter discusses that she has been able to speak with her father and he has indicated to her that he would like to be extubated.   She states she has talked with her brothers and one of them will be coming tomorrow.  She states they have indicated that they are amenable to removing the tube today as this is what he wishes.  Discussed scenarios and one-way extubation. Discussed DNR status in detail.  Discussed DNI status. She states they are hopeful that their father will improve, but understand that he may not, and if not he would die.    Patient was able to squeeze my hand for yes responses.  When asked if patient wanted the ETT removed he squeezed my hand, that he did.  When asked if he wanted to wait to remove it as his son should be coming tomorrow, he did not squeeze.  When asked if he wanted the tube out now, with the understanding that he could die, he squeezed my hand again.  CCM made aware.  I completed a MOST form today with daughter and the signed original was placed in the chart. Each section of options on the form were reviewed in full detail and any questions were answered as needed. The form was scanned and  sent to medical records for it to be uploaded under ACP tab in Epic. A photocopy was also placed in the chart to be scanned into EMR. The patient outlined their wishes for the following treatment decisions:  Cardiopulmonary Resuscitation: Do Not Attempt Resuscitation (DNR/No CPR)  Medical Interventions: Limited Additional Interventions: Use medical treatment, IV fluids and cardiac monitoring as indicated, DO NOT USE intubation or mechanical ventilation. May consider use of less invasive airway  support such as BiPAP or CPAP. Also provide comfort measures. Transfer to the hospital if indicated. Avoid intensive care.   Antibiotics: Antibiotics if indicated  IV Fluids: IV fluids if indicated  Feeding Tube: Left Blank      Length of Stay: 3  Current Medications: Scheduled Meds:   Chlorhexidine Gluconate Cloth  6 each Topical Q2200   docusate  100 mg Per Tube BID   dorzolamide-timolol  1 drop Both Eyes BID   free water  30 mL Per Tube Q4H   Gerhardt's butt cream   Topical TID   heparin  5,000 Units Subcutaneous Q8H   hydrocortisone sod succinate (SOLU-CORTEF) inj  100 mg Intravenous Q12H   ipratropium-albuterol  3 mL Nebulization Q6H   latanoprost  1 drop Both Eyes QHS   leptospermum manuka honey  1 Application Topical Daily   mupirocin ointment  1 Application Nasal BID   nutrition supplement (JUVEN)  1 packet Per Tube BID BM   mouth rinse  15 mL Mouth Rinse Q2H   pantoprazole (PROTONIX) IV  40 mg Intravenous QHS   polyethylene glycol  17 g Per Tube Daily    Continuous Infusions:  azithromycin Stopped (04/14/23 1923)   feeding supplement (VITAL AF 1.2 CAL) 30 mL/hr at 04/15/23 1100   norepinephrine (LEVOPHED) Adult infusion Stopped (04/15/23 0114)   piperacillin-tazobactam (ZOSYN)  IV Stopped (04/15/23 0929)   potassium PHOSPHATE IVPB (in mmol) 30 mmol (04/15/23 1216)   vancomycin Stopped (04/14/23 2221)    PRN Meds: acetaminophen **OR** acetaminophen, docusate, fentaNYL  (SUBLIMAZE) injection, fentaNYL (SUBLIMAZE) injection, HYDROcodone-acetaminophen, ipratropium-albuterol, ondansetron **OR** ondansetron (ZOFRAN) IV, mouth rinse, polyethylene glycol  Physical Exam Constitutional:      Comments: Does not open eyes.  Squeezes hands previous responses.  Pulmonary:     Comments: On ventilator            Vital Signs: BP (!) 171/86   Pulse 98   Temp (!) 97.5 F (36.4 C)   Resp 20   Ht 5\' 7"  (1.702 m)   Wt 33.5 kg Comment: repeated twice  SpO2 96%   BMI 11.57 kg/m  SpO2: SpO2: 96 % O2 Device: O2 Device: Ventilator O2 Flow Rate: O2 Flow Rate (L/min): 2 L/min  Intake/output summary:  Intake/Output Summary (Last 24 hours) at 04/15/2023 1332 Last data filed at 04/15/2023 1001 Gross per 24 hour  Intake 3352.6 ml  Output 1165 ml  Net 2187.6 ml   LBM: Last BM Date : 04/14/23 Baseline Weight: Weight: 65.8 kg Most recent weight: Weight: 33.5 kg (repeated twice)         Patient Active Problem List   Diagnosis Date Noted   Pressure injury of deep tissue 04/13/2023   Respiratory failure (HCC) 04/13/2023   Acute hypoxic respiratory failure (HCC) 04/13/2023   Hypothermia due to exposure 04/12/2023   Rhabdomyolysis 04/12/2023   Severe sepsis (HCC) 04/12/2023   Hyponatremia 04/12/2023   Acute metabolic encephalopathy 04/12/2023   Pneumonia, suspect aspiration 04/12/2023   Abnormal LFTs 04/12/2023   Fall in bathtub, partially filled 04/12/2023   Primary insomnia 06/27/2021   Hip pain 01/30/2021   Idiopathic hypotension 01/30/2021   HLD (hyperlipidemia)    Hypertensive heart disease with diastolic congestive heart failure (HCC) 12/28/2019   Annual physical exam 12/28/2019   BPH with obstruction/lower urinary tract symptoms    Essential hypertension 10/26/2019   Need for influenza vaccination 10/26/2019   Acute right hip pain 10/26/2019   Paget's bone disease 06/29/2019   Meatal stenosis 10/06/2014  Adenocarcinoma of prostate (HCC) 07/10/2014    Secondary malignant neoplasm of bone (HCC) 11/26/2012    Palliative Care Assessment & Plan    Recommendations/Plan: Plans for one-way extubation.  Daughter is hopeful patient will do well following extubation.  CCM to manage.  Patient declines, will switch to comfort care.  PMT will follow.   Code Status:    Code Status Orders  (From admission, onward)           Start     Ordered   04/15/23 1333  Do not attempt resuscitation (DNR)- Limited -Do Not Intubate (DNI)  (Code Status)  Continuous       Question Answer Comment  If pulseless and not breathing No CPR or chest compressions.   In Pre-Arrest Conditions (Patient Is Breathing and Has A Pulse) Do not intubate. Provide all appropriate non-invasive medical interventions. Avoid ICU transfer unless indicated or required.   Consent: Discussion documented in EHR or advanced directives reviewed      04/15/23 1332           Code Status History     Date Active Date Inactive Code Status Order ID Comments User Context   04/13/2023 2237 04/15/2023 1332 Full Code [id number]  Dahlia Byes, NP Inpatient   04/13/2023 1845 04/13/2023 2237 Do not attempt resuscitation (DNR) PRE-ARREST INTERVENTIONS DESIRED [id number]  Erin Fulling, MD ED   04/13/2023 1842 04/13/2023 1845 Full Code [id number]  Andreas Ohm, NP ED   04/12/2023 2241 04/13/2023 1842 Full Code [id number]  Andris Baumann, MD ED       Care plan was discussed with CCM NP and RN   Thank you for allowing the Palliative Medicine Team to assist in the care of this patient.  Morton Stall, NP  Please contact Palliative Medicine Team phone at [phone] for questions and concerns.

## 2023-04-15 NOTE — Progress Notes (Signed)
*  PRELIMINARY RESULTS* Echocardiogram 2D Echocardiogram has been performed.  [name] 04/15/2023, 1:16 PM

## 2023-04-15 NOTE — IPAL (Signed)
  Interdisciplinary Goals of Care Family Meeting   Date carried out: 04/15/2023  Location of the meeting: Bedside  Member's involved: Nurse Practitioner and Family Member or next of kin  Durable Power of Attorney or acting medical decision maker: Pt's daughter    Discussion: We discussed goals of care for Dean White .  Pt's daughter Dean White to speak with PCCM for follow up after conversation with Palliative Care earlier this morning.  She requests that we proceed with 1 way extubation.  Per conversation with Palliative earlier today, pt was able to participate in conversation with daughter at bedside, and he squeezed yes to wanting the ETT removed, even if he meant he could possibly die, and that he did not want to wait on his brothers to arrive before removing the tube.  Discussed with his daughter that pt is on minimal vent support and respiratory mechanics are good, however unclear if he will be able to protect his airway given that he remains with intermittent encephalopathy, but we will not know until we remove the tube.  We discussed that if he were not to tolerate extubation and decline, then plan would be to transition to Comfort measures at that time.  WILL PROCEED WITH 1 WAY EXTUBATION  Code status:   Code Status: Limited: Do not attempt resuscitation (DNR) -DNR-LIMITED -Do Not Intubate/DNI    Disposition: Continue current acute care : 1 WAY EXTUBATION with transition to Comfort Measures should he not tolerate extubation   Time spent for the meeting: 10 minutes     Dean White, AGACNP-BC [facility] Prefer epic messenger for cross cover needs If after hours, please call E-link  Judithe Modest, NP  04/15/2023, 1:43 PM

## 2023-04-15 NOTE — Progress Notes (Signed)
 PHARMACY CONSULT NOTE - ELECTROLYTES  Pharmacy Consult for Electrolyte Monitoring and Replacement   Recent Labs: Height: 5\' 7"  (170.2 cm) Weight: 33.5 kg (73 lb 13.7 oz) (repeated twice) IBW/kg (Calculated) : 66.1 Estimated Creatinine Clearance: 27.2 mL/min (by C-G formula based on SCr of 0.82 mg/dL). Potassium (mmol/L)  Date Value  04/15/2023 2.9 (L)   Magnesium (mg/dL)  Date Value  78/29/5621 2.2   Calcium (mg/dL)  Date Value  30/86/5784 8.2 (L)   Albumin (g/dL)  Date Value  69/62/9528 2.0 (L)   Phosphorus (mg/dL)  Date Value  41/32/4401 1.6 (L)   Sodium (mmol/L)  Date Value  04/15/2023 143   Corrected Ca: 9 mg/dL  Assessment  Dean White is a 89 y.o. male presenting with severe sepsis. PMH significant for prostate cancer on leuprolide, HTN, and Paget's disease of the bone with chronic hip pain and mild hearing impairment. Pharmacy has been consulted to monitor and replace electrolytes.  Nutrition: Vital AF at 30 mL/hr + free water flushes at 30 mL every 4 hours  Goal of Therapy: Electrolytes WNL  Plan:  30 mmol IV potassium phosphate x 1 (contains 44 meQ iv potassium) Check BMP, Mg, Phos with AM labs  Thank you for allowing pharmacy to be a part of this patient's care.  Lowella Bandy, PharmD Clinical Pharmacist 04/15/2023 7:09 AM

## 2023-04-15 NOTE — Progress Notes (Signed)
 pT. EXTUBATED TO [facility]

## 2023-04-15 DEATH — deceased

## 2023-04-16 DIAGNOSIS — E440 Moderate protein-calorie malnutrition: Secondary | ICD-10-CM | POA: Insufficient documentation

## 2023-04-16 DIAGNOSIS — Z7189 Other specified counseling: Secondary | ICD-10-CM | POA: Diagnosis not present

## 2023-04-16 DIAGNOSIS — R6520 Severe sepsis without septic shock: Secondary | ICD-10-CM | POA: Diagnosis not present

## 2023-04-16 DIAGNOSIS — A419 Sepsis, unspecified organism: Secondary | ICD-10-CM | POA: Diagnosis not present

## 2023-04-16 LAB — RENAL FUNCTION PANEL
Albumin: 2.1 g/dL — ABNORMAL LOW (ref 3.5–5.0)
Anion gap: 8 (ref 5–15)
BUN: 21 mg/dL (ref 8–23)
CO2: 20 mmol/L — ABNORMAL LOW (ref 22–32)
Calcium: 7.8 mg/dL — ABNORMAL LOW (ref 8.9–10.3)
Chloride: 114 mmol/L — ABNORMAL HIGH (ref 98–111)
Creatinine, Ser: 0.72 mg/dL (ref 0.61–1.24)
GFR, Estimated: >60 mL/min (ref >60)
Glucose, Bld: 100 mg/dL — ABNORMAL HIGH (ref 70–99)
Phosphorus: 2.3 mg/dL — ABNORMAL LOW (ref 2.5–4.6)
Potassium: 3.5 mmol/L (ref 3.5–5.1)
Sodium: 142 mmol/L (ref 135–145)

## 2023-04-16 LAB — CBC
HCT: 41.5 % (ref 39.0–52.0)
Hemoglobin: 13.5 g/dL (ref 13.0–17.0)
MCH: 30.1 pg (ref 26.0–34.0)
MCHC: 32.5 g/dL (ref 30.0–36.0)
MCV: 92.6 fL (ref 80.0–100.0)
Platelets: 181 10*3/uL (ref 150–400)
RBC: 4.48 MIL/uL (ref 4.22–5.81)
RDW: 14 % (ref 11.5–15.5)
WBC: 15.9 10*3/uL — ABNORMAL HIGH (ref 4.0–10.5)
nRBC: 0 % (ref 0.0–0.2)

## 2023-04-16 LAB — LEGIONELLA PNEUMOPHILA SEROGP 1 UR AG: L. pneumophila Serogp 1 Ur Ag: NEGATIVE

## 2023-04-16 LAB — BLOOD GAS, ARTERIAL
Acid-base deficit: 7.9 mmol/L — ABNORMAL HIGH (ref 0.0–2.0)
Bicarbonate: 25.9 mmol/L (ref 20.0–28.0)
O2 Saturation: 87.1 %
Patient temperature: 37
pCO2 arterial: 105 mmHg (ref 32–48)
pH, Arterial: 7 — CL (ref 7.35–7.45)
pO2, Arterial: 64 mmHg — ABNORMAL LOW (ref 83–108)

## 2023-04-16 LAB — CK: Total CK: 573 U/L — ABNORMAL HIGH (ref 49–397)

## 2023-04-16 LAB — MAGNESIUM: Magnesium: 2 mg/dL (ref 1.7–2.4)

## 2023-04-16 LAB — GLUCOSE, CAPILLARY
Glucose-Capillary: 91 mg/dL (ref 70–99)
Glucose-Capillary: 92 mg/dL (ref 70–99)

## 2023-04-16 MED ORDER — FUROSEMIDE 10 MG/ML IJ SOLN
40.0000 mg | Freq: Once | INTRAMUSCULAR | Status: AC
  Administered 2023-04-16: 40 mg via INTRAVENOUS
  Filled 2023-04-16: qty 4

## 2023-04-16 MED ORDER — GLYCOPYRROLATE 1 MG PO TABS: 1.0000 mg | ORAL_TABLET | ORAL | Status: DC | PRN

## 2023-04-16 MED ORDER — HALOPERIDOL LACTATE 2 MG/ML PO CONC: 0.5000 mg | ORAL | Status: DC | PRN

## 2023-04-16 MED ORDER — GLYCOPYRROLATE 0.2 MG/ML IJ SOLN: 0.2000 mg | INTRAMUSCULAR | Status: DC | PRN

## 2023-04-16 MED ORDER — HALOPERIDOL 0.5 MG PO TABS: 0.5000 mg | ORAL_TABLET | ORAL | Status: DC | PRN

## 2023-04-16 MED ORDER — MORPHINE BOLUS VIA INFUSION: 1.0000 mg | INTRAVENOUS | Status: DC | PRN

## 2023-04-16 MED ORDER — K PHOS MONO-SOD PHOS DI & MONO 155-852-130 MG PO TABS
500.0000 mg | ORAL_TABLET | Freq: Once | ORAL | Status: DC
Start: 2023-04-16 — End: 2023-04-16
  Filled 2023-04-16: qty 2

## 2023-04-16 MED ORDER — HALOPERIDOL LACTATE 5 MG/ML IJ SOLN: 0.5000 mg | INTRAMUSCULAR | Status: DC | PRN

## 2023-04-16 MED ORDER — MORPHINE 100MG IN NS 100ML (1MG/ML) PREMIX INFUSION
5.0000 mg/h | INTRAVENOUS | Status: DC
  Filled 2023-04-16: qty 100

## 2023-04-16 MED ORDER — GUAIFENESIN 100 MG/5ML PO LIQD: 20.0000 mL | Freq: Three times a day (TID) | ORAL | Status: DC

## 2023-04-16 MED ORDER — MORPHINE SULFATE (PF) 2 MG/ML IV SOLN
1.0000 mg | INTRAVENOUS | Status: DC | PRN
  Administered 2023-04-16: 1 mg via INTRAVENOUS
  Filled 2023-04-16: qty 1

## 2023-04-16 MED ORDER — POLYVINYL ALCOHOL 1.4 % OP SOLN: 1.0000 [drp] | Freq: Four times a day (QID) | OPHTHALMIC | Status: DC | PRN

## 2023-04-16 MED ORDER — BIOTENE DRY MOUTH MT LIQD: 15.0000 mL | OROMUCOSAL | Status: DC | PRN

## 2023-04-16 MED ORDER — SODIUM BICARBONATE 8.4 % IV SOLN
50.0000 meq | Freq: Once | INTRAVENOUS | Status: AC
  Administered 2023-04-16: 50 meq via INTRAVENOUS
  Filled 2023-04-16: qty 50

## 2023-04-16 NOTE — Progress Notes (Signed)
 Daughter made aware of the transfer  patient room number  the nurse name and phone number and verbalized understanding.

## 2023-04-16 NOTE — Progress Notes (Signed)
 Daily Progress Note   Patient Name: Dean White       Date: 04/16/2023 DOB: 01-15-1928  Age: 89 y.o. MRN#: [id number] Attending Physician: Gillis Santa, MD Primary Care Physician: Corky Downs, MD Admit Date: 04/12/2023  Reason for Consultation/Follow-up: Establishing goals of care  Subjective: Notes and labs reviewed.  Patient is resting in bed at this time, no family bedside.  He has oxygen mask in place.  He does not respond to voice or touch.  He is extremely diaphoretic at this time.  Attending is aware.  Stepped out to call daughter.  Discussed his status and she came to the hospital.  We discussed this status and care moving forward.  RT and with BiPAP.  She called her brother Judie Grieve on the phone and we discussed patient's status.  Brother states their father has suffered enough and he does not want to initiate BiPAP.  Patient's daughter agrees with this.  Patient's son was called by E- link and was E- linked into patient's room to visit.  Patient shifting to comfort care.  Anticipate hospital death.  I completed a MOST form today and the signed original was placed in the chart. Each section of options on the form were reviewed in full detail and any questions were answered as needed. The form was scanned and sent to medical records for it to be uploaded under ACP tab in Epic. A photocopy was also placed in the chart to be scanned into EMR. The patient outlined their wishes for the following treatment decisions:  Cardiopulmonary Resuscitation: Do Not Attempt Resuscitation (DNR/No CPR)  Medical Interventions: Comfort Measures: Keep clean, warm, and dry. Use medication by any route, positioning, wound care, and other measures to relieve pain and suffering. Use oxygen, suction and manual  treatment of airway obstruction as needed for comfort. Do not transfer to the hospital unless comfort needs cannot be met in current location.  Antibiotics: No antibiotics (use other measures to relieve symptoms)  IV Fluids: No IV fluids (provide other measures to ensure comfort)  Feeding Tube: No feeding tube    Length of Stay: 4  Current Medications: Scheduled Meds:   Chlorhexidine Gluconate Cloth  6 each Topical Q2200   docusate  100 mg Per Tube BID   dorzolamide-timolol  1 drop Both Eyes BID   free  water  30 mL Per Tube Q4H   Gerhardt's butt cream   Topical TID   guaiFENesin  20 mL Per Tube TID   hydrocortisone sod succinate (SOLU-CORTEF) inj  50 mg Intravenous Q12H   latanoprost  1 drop Both Eyes QHS   leptospermum manuka honey  1 Application Topical Daily   mupirocin ointment  1 Application Nasal BID   mouth rinse  15 mL Mouth Rinse Q2H   pantoprazole (PROTONIX) IV  40 mg Intravenous QHS   phosphorus  500 mg Per Tube Once   polyethylene glycol  17 g Per Tube Daily    Continuous Infusions:  morphine      PRN Meds: acetaminophen **OR** acetaminophen, antiseptic oral rinse, docusate, glycopyrrolate **OR** glycopyrrolate **OR** glycopyrrolate, haloperidol **OR** haloperidol **OR** haloperidol lactate, ipratropium-albuterol, labetalol, morphine, ondansetron **OR** ondansetron (ZOFRAN) IV, mouth rinse, polyethylene glycol, polyvinyl alcohol  Physical Exam Constitutional:      Comments: Eyes closed.  Diaphoretic.  Pulmonary:     Comments: Congested breathing.  Oxygen mask in place.   Skin:    Comments: Generalized edema             Vital Signs: BP (!) 144/72   Pulse 89   Temp (!) 97.2 F (36.2 C)   Resp 12   Ht 5\' 7"  (1.702 m)   Wt 74 kg   SpO2 (!) 89%   BMI 25.55 kg/m  SpO2: SpO2: (!) 89 % O2 Device: O2 Device: Venturi Mask O2 Flow Rate: O2 Flow Rate (L/min): 12 L/min  Intake/output summary:  Intake/Output Summary (Last 24 hours) at 04/16/2023 1157 Last data  filed at 04/16/2023 0600 Gross per 24 hour  Intake 1222.92 ml  Output 505 ml  Net 717.92 ml   LBM: Last BM Date : 04/14/23 Baseline Weight: Weight: 65.8 kg Most recent weight: Weight: 74 kg    Patient Active Problem List   Diagnosis Date Noted   Malnutrition of moderate degree 04/16/2023   Pressure injury of deep tissue 04/13/2023   Respiratory failure (HCC) 04/13/2023   Acute hypoxic respiratory failure (HCC) 04/13/2023   Hypothermia due to exposure 04/12/2023   Rhabdomyolysis 04/12/2023   Severe sepsis (HCC) 04/12/2023   Hyponatremia 04/12/2023   Acute metabolic encephalopathy 04/12/2023   Pneumonia, suspect aspiration 04/12/2023   Abnormal LFTs 04/12/2023   Fall in bathtub, partially filled 04/12/2023   Primary insomnia 06/27/2021   Hip pain 01/30/2021   Idiopathic hypotension 01/30/2021   HLD (hyperlipidemia)    Hypertensive heart disease with diastolic congestive heart failure (HCC) 12/28/2019   Annual physical exam 12/28/2019   BPH with obstruction/lower urinary tract symptoms    Essential hypertension 10/26/2019   Need for influenza vaccination 10/26/2019   Acute right hip pain 10/26/2019   Paget's bone disease 06/29/2019   Meatal stenosis 10/06/2014   Adenocarcinoma of prostate (HCC) 07/10/2014   Secondary malignant neoplasm of bone (HCC) 11/26/2012    Palliative Care Assessment & Plan    Recommendations/Plan: Shifting to comfort care.  Anticipate hospital death.  Code Status:    Code Status Orders  (From admission, onward)           Start     Ordered   04/16/23 1132  Do not attempt resuscitation (DNR) - Comfort care  Continuous       Question Answer Comment  If patient has no pulse and is not breathing Do Not Attempt Resuscitation   In Pre-Arrest Conditions (Patient Is Breathing and Has a Pulse) Provide comfort measures. Relieve  any mechanical airway obstruction. Avoid transfer unless required for comfort.   Consent: Discussion documented in EHR  or advanced directives reviewed      04/16/23 1135           Code Status History     Date Active Date Inactive Code Status Order ID Comments User Context   04/15/2023 1332 04/16/2023 1135 Limited: Do not attempt resuscitation (DNR) -DNR-LIMITED -Do Not Intubate/DNI  [id number]  Morton Stall, NP Inpatient   04/13/2023 2237 04/15/2023 1332 Full Code [id number]  Dahlia Byes, NP Inpatient   04/13/2023 1845 04/13/2023 2237 Do not attempt resuscitation (DNR) PRE-ARREST INTERVENTIONS DESIRED [id number]  Erin Fulling, MD ED   04/13/2023 1842 04/13/2023 1845 Full Code [id number]  Andreas Ohm, NP ED   04/12/2023 2241 04/13/2023 1842 Full Code [id number]  Andris Baumann, MD ED       Prognosis:  Hours - Days   Care plan was discussed with attending via epic chat and primary RN in person.  Thank you for allowing the Palliative Medicine Team to assist in the care of this patient.    Morton Stall, NP  Please contact Palliative Medicine Team phone at [phone] for questions and concerns.

## 2023-04-16 NOTE — Progress Notes (Signed)
 PHARMACY CONSULT NOTE - ELECTROLYTES  Pharmacy Consult for Electrolyte Monitoring and Replacement   Recent Labs: Height: 5\' 7"  (170.2 cm) Weight: 74 kg (163 lb 2.3 oz) IBW/kg (Calculated) : 66.1 Estimated Creatinine Clearance: 55.1 mL/min (by C-G formula based on SCr of 0.72 mg/dL). Potassium (mmol/L)  Date Value  04/16/2023 3.5   Magnesium (mg/dL)  Date Value  16/10/9602 2.0   Calcium (mg/dL)  Date Value  54/09/8117 7.8 (L)   Albumin (g/dL)  Date Value  14/78/2956 2.1 (L)   Phosphorus (mg/dL)  Date Value  21/30/8657 2.3 (L)   Sodium (mmol/L)  Date Value  04/16/2023 142   Corrected Ca: 9 mg/dL  Assessment  Dean White is a 89 y.o. male presenting with severe sepsis. PMH significant for prostate cancer on leuprolide, HTN, and Paget's disease of the bone with chronic hip pain and mild hearing impairment. Pharmacy has been consulted to monitor and replace electrolytes.  Nutrition: Vital AF at 30 mL/hr + free water flushes at 30 mL every 4 hours  Goal of Therapy: Electrolytes WNL  Plan:  500 mg phosphorous per tube x 1 (contains phosphorus 16 mMol, potassium 2.2 mEq) Patient has shifted to comfort care status, pharmacy will sign off of this consult  Thank you for allowing pharmacy to be a part of this patient's care.  Lowella Bandy, PharmD Clinical Pharmacist 04/16/2023 7:11 AM

## 2023-04-16 NOTE — Progress Notes (Signed)
 Pts daughter asked if Morphine can only be given PRN if pt looks like they are in distress. Pts son coming into town tomorrow and daughter would like to give pt a chance to wake up and respond. Education given about pts current status and labs. Daughter was told the likelihood if pt wakening at this time would be slim, but daughter insist that med should only be given if necessary. MD and NP aware. Pts RR increasing, but pt does appear comfortable at this time. Will continue to monitor and treat as necessary.

## 2023-04-16 NOTE — Progress Notes (Signed)
 Pt's daughter has refused the bipap for pt.

## 2023-04-16 NOTE — Progress Notes (Signed)
 [facility] Progress Note  Patient: Dean White    ZYS:[id number]  DOA: 04/12/2023     Date of Service: the patient was seen and examined on 04/16/2023  Chief Complaint  Patient presents with   Altered Mental Status   Brief hospital course: 89 y.o. 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     Nephew called  911 after they had not seen him in a couple days.  He was found lying in the bathtub, partially filled with water with excrement.     Initially He was awake, oriented to name.   Hypothermic with temp of 86 and hypoxic with EMS, brought in on NRB.  History was initially provided by his daughter and POA Dean White over the phone.   At baseline he is independent in ADLs and IADLs and does everything himself including cooking and driving to his doctors appointments.   She did state that his hip had been giving him some trouble and while he loves to take baths, she had stopped him from soaking in the tub because he was having increasing difficulty getting out of the bathtub.  He agreed to only take showers, but he refused to use a shower stool as she recommended.   PCCM consulted for evaluation of hypotension as well as increased work of breath. He was noted to be somnolent and minimally responsive to verbal or tactili stimuli. He was noted to be hypotensive as well as hypoglycemic. He was intubated at the bedside secondary to his encephalopathy.  Significant Hospital Events:  04/12/2023: admission 04/13/2023: PCCM consult, intubated 04/14/2023: minimally responsive, off sedation this AM 04/15/2023: remains encephalopathic, more agitated but non-purposeful, passed SBT 4/2 started comfort measures, palliative care consult appreciated.  Patient may  possibly during hospital stay.  Assessment and Plan:  # Toxic Metabolic Encephalopathy # Acute Hypoxic Respiratory Failure # Aspiration Pneumonia  Right Lower Lobe Pneumonia # Sepsis # Rhabdomyolysis # Hypoglycemia  Patient was admitted in the ICU on 04/12/2023 and transition to [facility] service on 04/16/2023.  Patient was critically ill with respiratory failure, palliative care was consulted and patient was transition to comfort measures only. Please review detailed management done by the ICU team  # Comfort measures only Continue as needed medication for pain control May start morphine IV infusion if needed RN to pronounce death Follow palliative care for symptom management    Body mass index is 25.55 kg/m.  Nutrition Problem: Moderate Malnutrition Etiology: cancer and cancer related treatments Interventions:    Pressure Injury 04/12/23 Buttocks Left Stage 2 -  Partial thickness loss of dermis presenting as a shallow open injury with a red, pink wound bed without slough. (Active)  04/12/23 1900  Location: Buttocks  Location Orientation: Left  Staging: Stage 2 -  Partial thickness loss of dermis presenting as a shallow open injury with a red, pink wound bed without slough.  Wound Description (Comments):   Present on Admission: Yes  Dressing Type Foam - Lift dressing to assess site every shift 04/16/23 0200     Pressure Injury 04/12/23 Buttocks Right Stage 2 -  Partial thickness loss of dermis presenting as a shallow open injury with a red, pink wound bed without slough. (Active)  04/12/23 1900  Location: Buttocks  Location Orientation: Right  Staging: Stage 2 -  Partial thickness loss of dermis presenting as a shallow open injury with a red, pink wound bed without slough.  Wound Description (Comments):   Present on Admission: Yes  Dressing Type Foam - Lift dressing to assess site every shift 04/16/23 0200     Pressure Injury 04/12/23 Shoulder Right Stage 2 -  Partial  thickness loss of dermis presenting as a shallow open injury with a red, pink wound bed without slough. (Active)  04/12/23 1900  Location: Shoulder  Location Orientation: Right  Staging: Stage 2 -  Partial thickness loss of dermis presenting as a shallow open injury with a red, pink wound bed without slough.  Wound Description (Comments):   Present on Admission: Yes  Dressing Type Foam - Lift dressing to assess site every shift 04/16/23 0200     Pressure Injury 04/12/23 Elbow Posterior;Right Stage 2 -  Partial thickness loss of dermis presenting as a shallow open injury with a red, pink wound bed without slough. (Active)  04/12/23 1900  Location: Elbow  Location Orientation: Posterior;Right  Staging: Stage 2 -  Partial thickness loss of dermis presenting as a shallow open injury with a red, pink wound bed without slough.  Wound Description (Comments):   Present on Admission: Yes  Dressing Type Foam - Lift dressing to assess site every shift 04/16/23 0200     Pressure Injury 04/13/23 Hip Right Stage 2 -  Partial thickness loss of dermis presenting as a shallow open injury with a red, pink wound bed without slough. (Active)  04/13/23 2130  Location: Hip  Location Orientation: Right  Staging: Stage 2 -  Partial thickness loss of dermis presenting as a shallow open injury with a red, pink wound bed without slough.  Wound Description (Comments):   Present on Admission: Yes  Dressing Type Gauze (Comment) 04/16/23 0200     Diet: NPO DVT Prophylaxis: Comfort care only  Advance goals of care discussion: DNR/comfort care  Family Communication: family was present at bedside, at the time of interview.  Patient was completely obtunded and resting comfortably.  Unable to follow command  Disposition:  Pt is from home, admitted with sepsis due to aspiration pneumonia.  Critical condition, poor prognosis, transition to comfort measures. Patient may pass away during hospital stay versus plan is  to proceed with hospice inpatient facility placement  Subjective: No significant events overnight, patient remained in hypoxic respiratory failure, ABG was done, management was started, patient needed BiPAP but transition to comfort measures by patient's daughter who is POA. Currently patient is obtunded and sleepy, unable to offer any complaints, seems to be resting comfortably.  Patient's daughter was at bedside, all question and concerns answered.  Physical Exam: General: NAD, lying comfortably Appear in no distress. Eyes: closed ENT: Oral Mucosa Clear, but a dry Neck: no JVD,  Cardiovascular: S1 and S2 Present, no Murmur,  Respiratory: Equal air entry bilaterally, bibasilar crackles.  No wheezes Abdomen: Bowel Sound present, Soft and no tenderness,  Skin: no rashes Extremities: no Pedal edema, no calf tenderness Neurologic: Sleepy, resting comfortably, unable to follow command. On comfort measures only  Vitals:   04/16/23 0700 04/16/23 0800 04/16/23 0900 04/16/23 1000  BP: (!) 147/63 (!) 148/64 (!) 152/70 (!) 144/72  Pulse: 89 80 92 89  Resp: (!) 25 (!) 29 (!) 23 12  Temp: 99.3 F (37.4 C) 98.6 F (37 C) 98.1 F (36.7 C) (!) 97.2 F (36.2 C)  TempSrc:  SpO2: (!) 89% 90% 92% (!) 89%  Weight:      Height:        Intake/Output Summary (Last 24 hours) at 04/16/2023 1240 Last data filed at 04/16/2023 0600 Gross per 24 hour  Intake 1222.92 ml  Output 505 ml  Net 717.92 ml   Filed Weights   04/14/23 0400 04/15/23 0500 04/16/23 0500  Weight: 68.1 kg 33.5 kg 74 kg    Data Reviewed: I have personally reviewed and interpreted daily labs, tele strips, imagings as discussed above. I reviewed all nursing notes, pharmacy notes, vitals, pertinent old records I have discussed plan of care as described above with RN and patient/family.  CBC: Recent Labs  Lab 04/12/23 1745 04/13/23 0522 04/14/23 0413 04/15/23 0421 04/16/23 0326  WBC 5.4 4.1 15.8* 14.5* 15.9*  NEUTROABS  4.7  --   --  12.5*  --   HGB 15.7 14.3 14.6 12.6* 13.5  HCT 47.1 42.5 43.5 38.0* 41.5  MCV 91.3 90.8 90.8 91.6 92.6  PLT 189 170 186 170 181   Basic Metabolic Panel: Recent Labs  Lab 04/13/23 0522 04/13/23 2040 04/14/23 0413 04/15/23 0421 04/15/23 2050 04/16/23 0326  NA 133* 136 139 143  --  142  K 3.2* 3.2* 3.8 2.9* 3.2* 3.5  CL 102 104 110 118*  --  114*  CO2 19* 15* 18* 19*  --  20*  GLUCOSE 54* 77 93 160*  --  100*  BUN 41* 42* 35* 28*  --  21  CREATININE 0.80 1.08 0.88 0.82  --  0.72  CALCIUM 7.6* 8.0* 8.2* 8.2*  --  7.8*  MG  --  2.1 2.1 2.2  --  2.0  PHOS  --  3.1 2.7 1.6* 3.7 2.3*    Studies: ECHOCARDIOGRAM COMPLETE Result Date: 04/15/2023    ECHOCARDIOGRAM REPORT   Patient Name:   KIANTE CIAVARELLA Date of Exam: 04/15/2023 Medical Rec #:  [id number]        Height:       67.0 in Accession #:    [id number]       Weight:       73.9 lb Date of Birth:  [birth year]        BSA:          1.324 m Patient Age:    89 years         BP:           169/78 mmHg Patient Gender: M                HR:           101 bpm. Exam Location:  [facility] Procedure: 2D Echo, Cardiac Doppler and Color Doppler (Both Spectral and Color            Flow Doppler were utilized during procedure). Indications:     Shock, Syncope  History:         Patient has no prior history of Echocardiogram examinations.                  Signs/Symptoms:Syncope; Risk Factors:Hypertension and                  Dyslipidemia.  Sonographer:     Mikki Harbor Referring Phys:  [id number] Judithe Modest Diagnosing Phys: Yvonne Kendall MD  Sonographer Comments: Echo performed with patient supine and on artificial respirator. Image acquisition challenging due to respiratory motion. IMPRESSIONS  1. Left ventricular ejection fraction, by estimation, is 60  to 65%. The left ventricle has normal function. The left ventricle has no regional wall motion abnormalities. There is moderate asymmetric left ventricular hypertrophy of the basal-septal segment.  Left ventricular diastolic parameters are consistent with Grade I diastolic dysfunction (impaired relaxation).  2. Right ventricular systolic function is normal. The right ventricular size is normal. Tricuspid regurgitation signal is inadequate for assessing PA pressure.  3. Right atrial size was mildly dilated.  4. The mitral valve is degenerative. Trivial mitral valve regurgitation. No evidence of mitral stenosis.  5. The aortic valve is tricuspid. There is mild thickening of the aortic valve. Aortic valve regurgitation is trivial. Aortic valve sclerosis is present, with no evidence of aortic valve stenosis. FINDINGS  Left Ventricle: Left ventricular ejection fraction, by estimation, is 60 to 65%. The left ventricle has normal function. The left ventricle has no regional wall motion abnormalities. The left ventricular internal cavity size was normal in size. There is  moderate asymmetric left ventricular hypertrophy of the basal-septal segment. Left ventricular diastolic parameters are consistent with Grade I diastolic dysfunction (impaired relaxation). Right Ventricle: The right ventricular size is normal. No increase in right ventricular wall thickness. Right ventricular systolic function is normal. Tricuspid regurgitation signal is inadequate for assessing PA pressure. Left Atrium: Left atrial size was normal in size. Right Atrium: Right atrial size was mildly dilated. Pericardium: There is no evidence of pericardial effusion. Mitral Valve: The mitral valve is degenerative in appearance. There is mild thickening of the mitral valve leaflet(s). Mild mitral annular calcification. Trivial mitral valve regurgitation. No evidence of mitral valve stenosis. MV peak gradient, 6.4 mmHg. The mean mitral valve gradient is 3.0 mmHg. Tricuspid Valve: The tricuspid valve is normal in structure. Tricuspid valve regurgitation is trivial. Aortic Valve: The aortic valve is tricuspid. There is mild thickening of the aortic valve.  Aortic valve regurgitation is trivial. Aortic valve sclerosis is present, with no evidence of aortic valve stenosis. Aortic valve mean gradient measures 4.0 mmHg. Aortic valve peak gradient measures 7.7 mmHg. Aortic valve area, by VTI measures 3.26 cm. Pulmonic Valve: The pulmonic valve was not well visualized. Pulmonic valve regurgitation is mild. No evidence of pulmonic stenosis. Aorta: The aortic root is normal in size and structure. Venous: IVC assessment for right atrial pressure unable to be performed due to mechanical ventilation. IAS/Shunts: The interatrial septum was not well visualized.  LEFT VENTRICLE PLAX 2D LVIDd:         4.40 cm   Diastology LVIDs:         2.50 cm   LV e' medial:    8.27 cm/s LV PW:         0.86 cm   LV E/e' medial:  11.0 LV IVS:        1.35 cm   LV e' lateral:   7.07 cm/s LVOT diam:     2.00 cm   LV E/e' lateral: 12.9 LV SV:         85 LV SV Index:   64 LVOT Area:     3.14 cm  RIGHT VENTRICLE RV Basal diam:  3.30 cm RV Mid diam:    2.30 cm RV S prime:     20.70 cm/s TAPSE (M-mode): 2.4 cm LEFT ATRIUM             Index        RIGHT ATRIUM           Index LA diam:        2.90 cm  2.19 cm/m   RA Area:     16.70 cm LA Vol (A2C):   32.6 ml 24.62 ml/m  RA Volume:   48.60 ml  36.70 ml/m LA Vol (A4C):   43.5 ml 32.85 ml/m LA Biplane Vol: 39.4 ml 29.75 ml/m  AORTIC VALVE                    PULMONIC VALVE AV Area (Vmax):    2.76 cm     PV Vmax:       1.07 m/s AV Area (Vmean):   2.52 cm     PV Peak grad:  4.6 mmHg AV Area (VTI):     3.26 cm AV Vmax:           139.00 cm/s AV Vmean:          90.600 cm/s AV VTI:            0.261 m AV Peak Grad:      7.7 mmHg AV Mean Grad:      4.0 mmHg LVOT Vmax:         122.00 cm/s LVOT Vmean:        72.700 cm/s LVOT VTI:          0.271 m LVOT/AV VTI ratio: 1.04  AORTA Ao Root diam: 3.10 cm MITRAL VALVE MV Area (PHT): 5.70 cm     SHUNTS MV Area VTI:   3.68 cm     Systemic VTI:  0.27 m MV Peak grad:  6.4 mmHg     Systemic Diam: 2.00 cm MV Mean grad:   3.0 mmHg MV Vmax:       1.26 m/s MV Vmean:      79.5 cm/s MV Decel Time: 133 msec MV E velocity: 91.20 cm/s MV A velocity: 121.00 cm/s MV E/A ratio:  0.75 Cristal Deer End MD Electronically signed by Yvonne Kendall MD Signature Date/Time: 04/15/2023/4:57:06 PM    Final     Scheduled Meds:  Chlorhexidine Gluconate Cloth  6 each Topical Q2200   docusate  100 mg Per Tube BID   dorzolamide-timolol  1 drop Both Eyes BID   free water  30 mL Per Tube Q4H   Gerhardt's butt cream   Topical TID   guaiFENesin  20 mL Per Tube TID   hydrocortisone sod succinate (SOLU-CORTEF) inj  50 mg Intravenous Q12H   latanoprost  1 drop Both Eyes QHS   leptospermum manuka honey  1 Application Topical Daily   mupirocin ointment  1 Application Nasal BID   mouth rinse  15 mL Mouth Rinse Q2H   pantoprazole (PROTONIX) IV  40 mg Intravenous QHS   phosphorus  500 mg Per Tube Once   polyethylene glycol  17 g Per Tube Daily   Continuous Infusions:  morphine     PRN Meds: acetaminophen **OR** acetaminophen, antiseptic oral rinse, docusate, glycopyrrolate **OR** glycopyrrolate **OR** glycopyrrolate, haloperidol **OR** haloperidol **OR** haloperidol lactate, ipratropium-albuterol, labetalol, morphine, ondansetron **OR** ondansetron (ZOFRAN) IV, mouth rinse, polyethylene glycol, polyvinyl alcohol  Time spent: 55 minutes  Author: Gillis Santa. MD [facility] 04/16/2023 12:40 PM  To reach On-call, see care teams to locate the attending and reach out to them via [email or web address]. If 7PM-7AM, please contact night-coverage If you still have difficulty reaching the attending provider, please page the [facility] (Director on Call) for [facility] on amion for assistance.

## 2023-04-16 NOTE — Plan of Care (Signed)
  Problem: Clinical Measurements: Goal: Signs and symptoms of infection will decrease Outcome: Progressing   Problem: Respiratory: Goal: Ability to maintain adequate ventilation will improve Outcome: Progressing   Problem: Clinical Measurements: Goal: Will remain free from infection Outcome: Progressing Goal: Diagnostic test results will improve Outcome: Progressing Goal: Respiratory complications will improve Outcome: Progressing Goal: Cardiovascular complication will be avoided Outcome: Progressing   Problem: Activity: Goal: Risk for activity intolerance will decrease Outcome: Progressing   Problem: Pain Managment: Goal: General experience of comfort will improve and/or be controlled Outcome: Progressing   Problem: Skin Integrity: Goal: Risk for impaired skin integrity will decrease Outcome: Progressing

## 2023-04-17 DIAGNOSIS — Z515 Encounter for palliative care: Secondary | ICD-10-CM

## 2023-04-17 DIAGNOSIS — R6520 Severe sepsis without septic shock: Secondary | ICD-10-CM | POA: Diagnosis not present

## 2023-04-17 DIAGNOSIS — A419 Sepsis, unspecified organism: Secondary | ICD-10-CM | POA: Diagnosis not present

## 2023-04-17 LAB — CULTURE, RESPIRATORY W GRAM STAIN

## 2023-04-17 LAB — CULTURE, BLOOD (ROUTINE X 2)
Culture: NO GROWTH
Culture: NO GROWTH

## 2023-04-17 MED ORDER — MORPHINE SULFATE (PF) 2 MG/ML IV SOLN: 1.0000 mg | INTRAVENOUS | Status: DC | PRN

## 2023-05-15 NOTE — Plan of Care (Signed)
 Per 3rd shift report - Morphine drip has not been started since daughter/POA is asking for only IV pushes for now.  Dr. And Palliative informed  of situation.

## 2023-05-15 NOTE — Death Summary Note (Signed)
 DEATH SUMMARY   Patient Details  Name: Dean White MRN: [id number] DOB: November 17, 1928 QIO:[id number], Renda Rolls, MD Admission/Discharge Information   Admit Date:  15-Apr-2023  Date of Death: Date of Death: 2023/04/20  Time of Death: Time of Death: 0755  Length of Stay: 5   Principle Cause of death: Aspiration pneumonia  Hospital Diagnoses: Principal Problem:   Severe sepsis (HCC) Active Problems:   Pneumonia, suspect aspiration   Hypothermia due to exposure   Acute metabolic encephalopathy   Rhabdomyolysis   Abnormal LFTs   Hyponatremia   Adenocarcinoma of prostate (HCC)   Paget's bone disease   Fall in bathtub, partially filled   Pressure injury of deep tissue   Respiratory failure (HCC)   Acute hypoxic respiratory failure (HCC)   Malnutrition of moderate degree   End of life care   Hospital Course: 89 y.o. 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     Nephew called  911 after they had not seen him in a couple days.  He was found lying in the bathtub, partially filled with water with excrement.     Initially He was awake, oriented to name.   Hypothermic with temp of 86 and hypoxic with EMS, brought in on NRB.  History was initially provided by his daughter and POA Brandy Kabat over the phone.   At baseline he is independent in ADLs and IADLs and does everything himself including cooking and driving to his doctors appointments.   She did state that his hip had been giving him some trouble and while he loves to take baths, she had stopped him from soaking in the tub because he was having increasing difficulty getting out of the bathtub.  He agreed to only take showers, but he refused to use a shower stool as she recommended.   PCCM consulted  for evaluation of hypotension as well as increased work of breath. He was noted to be somnolent and minimally responsive to verbal or tactili stimuli. He was noted to be hypotensive as well as hypoglycemic. He was intubated at the bedside secondary to his encephalopathy.   Significant Hospital Events:  04-15-23: admission 04/13/2023: PCCM consult, intubated 04/14/2023: minimally responsive, off sedation this AM 04/15/2023: remains encephalopathic, more agitated but non-purposeful, passed SBT 4/2 started comfort measures, palliative care consult appreciated.  Patient may possibly during hospital stay.  Assessment and Plan:  # Toxic Metabolic Encephalopathy # Acute Hypoxic Respiratory Failure # Aspiration Pneumonia  Right Lower Lobe Pneumonia # Sepsis # Rhabdomyolysis # Hypoglycemia   Patient was admitted in the ICU on 04-15-2023 and transition to [facility] service on 04/16/2023.  Patient was critically ill with respiratory failure, palliative care was consulted and patient was transition to comfort measures only. Please review detailed management done by the ICU team   # Comfort measures only Continue as needed medication for pain control May start morphine IV infusion if needed RN to pronounce death palliative care was following for symptom management Apr 20, 2023 patient passed away today at 7:55 AM.  It was pronounced by RN on the floor.   Following management was done during ICU stay. * Severe sepsis due to RUL pneumonia on CT, most likely aspiration pneumonia Patient was tachycardic with heart rate in the 100's  and 110's. This was likely due to hypovolemia.   Hypothermia has resolved. He remains hypotensive. S/p 1L bolus of NS. S/p Cefepime vancomycin and Flagyl  Pneumonia, suspect aspiration Acute respiratory failure with hypoxia Possible aspiration pneumonia due to lying in the tub for 2 days Antibiotics include cefepime and vancomycin. SLP consult when patient is able to cooperate with  exam. Supplemental oxygen to keep saturations 88-92%. S/p Aspiration precautions   Hypothermia due to exposure; Resolved. Suspect related to lying in water for 2 days, temp 86 with EMS S/p Bair hugger  Acute metabolic encephalopathy Possibly related to hypothermia/SIRS/sepsis Head CT with no acute intracranial abnormality. Being repeated due to anisocoria. S/p  MRI brain, negative for any acute findings.   At baseline patient functions independently S/p Neurologic checks with aspiration and fall precautions S/p Delirium precautions and Haldol as needed  Abnormal LFTs Possibly related to rhabdomyolysis or sepsis. Abnormal RUQ ultrasound 09/2022-cirrhotic morphology/CBD dilatation RUQ [place] in 09/2022 showed cirrhotic morphology of the liver.Dilatation of the common bile duct up to 11 mm. Recommend correlation with LFTs. If there is concern for biliary obstruction, MRCP may be considered. CT abdomen and pelvis was performed and demonstrated no acute intr-abdominal pathology. There was right middle and lower lobe infiltrates that are consistent with CAP vs aspitation pneumonia.  Transaminases decreasing.   Rhabdomyolysis: Uncertain whether traumatic or not S/p IV hydration.  CK level trending down  Hyponatremia Hypochloremia Likely secondary to dehydration IV hydration with NS  Pressure injury of deep tissue Patient with multiple pressure injuries, appears to be stage I over bony eminences right side of body Wound care consulted and Decubitus precautions  Fall in bathtub, partially filled Possible fall versus inability to exit bathtub due to medication related somnolence and chronic hip pain No external injuries noted Daughter notes prior difficulty of patient getting out of bathtub due to left hip pain Patient recently prescribed methocarbamol on 3/23 which could have sedated patient PT OT eval, Fall precautions  Paget's bone disease Symptomatic for chronic hip  pain  Adenocarcinoma of prostate: pt was on leuprolide shots Last seen by urology November 2024 given 14-month follow-up       Procedures: Intubation  Consultations: PCCM  The results of significant diagnostics from this hospitalization (including imaging, microbiology, ancillary and laboratory) are listed below for reference.   Significant Diagnostic Studies: ECHOCARDIOGRAM COMPLETE Result Date: 04/15/2023    ECHOCARDIOGRAM REPORT   Patient Name:   Dean White Date of Exam: 04/15/2023 Medical Rec #:  [id number]        Height:       67.0 in Accession #:    [id number]       Weight:       73.9 lb Date of Birth:  1928-04-03        BSA:          1.324 m Patient Age:    89 years         BP:           169/78 mmHg Patient Gender: M                HR:           101 bpm. Exam Location:  [facility] Procedure: 2D Echo, Cardiac Doppler and Color Doppler (Both Spectral and Color            Flow Doppler were utilized during procedure). Indications:     Shock, Syncope  History:  Patient has no prior history of Echocardiogram examinations.                  Signs/Symptoms:Syncope; Risk Factors:Hypertension and                  Dyslipidemia.  Sonographer:     Mikki Harbor Referring Phys:  [id number] Judithe Modest Diagnosing Phys: Yvonne Kendall MD  Sonographer Comments: Echo performed with patient supine and on artificial respirator. Image acquisition challenging due to respiratory motion. IMPRESSIONS  1. Left ventricular ejection fraction, by estimation, is 60 to 65%. The left ventricle has normal function. The left ventricle has no regional wall motion abnormalities. There is moderate asymmetric left ventricular hypertrophy of the basal-septal segment. Left ventricular diastolic parameters are consistent with Grade I diastolic dysfunction (impaired relaxation).  2. Right ventricular systolic function is normal. The right ventricular size is normal. Tricuspid regurgitation signal is inadequate for assessing PA  pressure.  3. Right atrial size was mildly dilated.  4. The mitral valve is degenerative. Trivial mitral valve regurgitation. No evidence of mitral stenosis.  5. The aortic valve is tricuspid. There is mild thickening of the aortic valve. Aortic valve regurgitation is trivial. Aortic valve sclerosis is present, with no evidence of aortic valve stenosis. FINDINGS  Left Ventricle: Left ventricular ejection fraction, by estimation, is 60 to 65%. The left ventricle has normal function. The left ventricle has no regional wall motion abnormalities. The left ventricular internal cavity size was normal in size. There is  moderate asymmetric left ventricular hypertrophy of the basal-septal segment. Left ventricular diastolic parameters are consistent with Grade I diastolic dysfunction (impaired relaxation). Right Ventricle: The right ventricular size is normal. No increase in right ventricular wall thickness. Right ventricular systolic function is normal. Tricuspid regurgitation signal is inadequate for assessing PA pressure. Left Atrium: Left atrial size was normal in size. Right Atrium: Right atrial size was mildly dilated. Pericardium: There is no evidence of pericardial effusion. Mitral Valve: The mitral valve is degenerative in appearance. There is mild thickening of the mitral valve leaflet(s). Mild mitral annular calcification. Trivial mitral valve regurgitation. No evidence of mitral valve stenosis. MV peak gradient, 6.4 mmHg. The mean mitral valve gradient is 3.0 mmHg. Tricuspid Valve: The tricuspid valve is normal in structure. Tricuspid valve regurgitation is trivial. Aortic Valve: The aortic valve is tricuspid. There is mild thickening of the aortic valve. Aortic valve regurgitation is trivial. Aortic valve sclerosis is present, with no evidence of aortic valve stenosis. Aortic valve mean gradient measures 4.0 mmHg. Aortic valve peak gradient measures 7.7 mmHg. Aortic valve area, by VTI measures 3.26 cm.  Pulmonic Valve: The pulmonic valve was not well visualized. Pulmonic valve regurgitation is mild. No evidence of pulmonic stenosis. Aorta: The aortic root is normal in size and structure. Venous: IVC assessment for right atrial pressure unable to be performed due to mechanical ventilation. IAS/Shunts: The interatrial septum was not well visualized.  LEFT VENTRICLE PLAX 2D LVIDd:         4.40 cm   Diastology LVIDs:         2.50 cm   LV e' medial:    8.27 cm/s LV PW:         0.86 cm   LV E/e' medial:  11.0 LV IVS:        1.35 cm   LV e' lateral:   7.07 cm/s LVOT diam:     2.00 cm   LV E/e' lateral: 12.9 LV SV:  85 LV SV Index:   64 LVOT Area:     3.14 cm  RIGHT VENTRICLE RV Basal diam:  3.30 cm RV Mid diam:    2.30 cm RV S prime:     20.70 cm/s TAPSE (M-mode): 2.4 cm LEFT ATRIUM             Index        RIGHT ATRIUM           Index LA diam:        2.90 cm 2.19 cm/m   RA Area:     16.70 cm LA Vol (A2C):   32.6 ml 24.62 ml/m  RA Volume:   48.60 ml  36.70 ml/m LA Vol (A4C):   43.5 ml 32.85 ml/m LA Biplane Vol: 39.4 ml 29.75 ml/m  AORTIC VALVE                    PULMONIC VALVE AV Area (Vmax):    2.76 cm     PV Vmax:       1.07 m/s AV Area (Vmean):   2.52 cm     PV Peak grad:  4.6 mmHg AV Area (VTI):     3.26 cm AV Vmax:           139.00 cm/s AV Vmean:          90.600 cm/s AV VTI:            0.261 m AV Peak Grad:      7.7 mmHg AV Mean Grad:      4.0 mmHg LVOT Vmax:         122.00 cm/s LVOT Vmean:        72.700 cm/s LVOT VTI:          0.271 m LVOT/AV VTI ratio: 1.04  AORTA Ao Root diam: 3.10 cm MITRAL VALVE MV Area (PHT): 5.70 cm     SHUNTS MV Area VTI:   3.68 cm     Systemic VTI:  0.27 m MV Peak grad:  6.4 mmHg     Systemic Diam: 2.00 cm MV Mean grad:  3.0 mmHg MV Vmax:       1.26 m/s MV Vmean:      79.5 cm/s MV Decel Time: 133 msec MV E velocity: 91.20 cm/s MV A velocity: 121.00 cm/s MV E/A ratio:  0.75 Yvonne Kendall MD Electronically signed by Yvonne Kendall MD Signature Date/Time: 04/15/2023/4:57:06  PM    Final    DG Chest Port 1 View Result Date: 04/14/2023 CLINICAL DATA:  Aspiration. EXAM: PORTABLE CHEST 1 VIEW COMPARISON:  04/13/2023 FINDINGS: Endotracheal tube tip is 2.9 cm above the base of the carina. The NG tube passes into the stomach although the distal tip position is not included on the film. Stable asymmetric elevation right hemidiaphragm. Streaky density at the left base is similar to prior, likely atelectatic. Hazy opacity at the right base again noted compatible with atelectasis or infiltrate. Possible tiny right pleural effusion. The cardiopericardial silhouette is within normal limits for size. No acute bony abnormality. Telemetry leads overlie the chest. IMPRESSION: 1. Endotracheal tube tip is 2.9 cm above the base of the carina. 2. Hazy opacity at the right base compatible with atelectasis or infiltrate. Possible tiny right pleural effusion. Electronically Signed   By: Kennith Center M.D.   On: 04/14/2023 05:17   DG Abd 1 View Result Date: 04/13/2023 CLINICAL DATA:  Nasogastric tube placement. EXAM: ABDOMEN - 1 VIEW COMPARISON:  None Available.  FINDINGS: The bowel gas pattern is normal. Nasogastric tube tip is seen in proximal stomach. No radio-opaque calculi or other significant radiographic abnormality are seen. IMPRESSION: Nasogastric tube tip seen in proximal stomach. No abnormal bowel dilatation. Electronically Signed   By: Lupita Raider M.D.   On: 04/13/2023 19:06   DG Chest 1 View Result Date: 04/13/2023 CLINICAL DATA:  Nasogastric tube placement. EXAM: CHEST  1 VIEW COMPARISON:  April 12, 2023. FINDINGS: The heart size and mediastinal contours are within normal limits. Endotracheal tube is in good position. Nasogastric tube is seen entering stomach. Minimal left basilar subsegmental atelectasis is noted. Elevated right hemidiaphragm is noted with minimal right basilar subsegmental atelectasis. The visualized skeletal structures are unremarkable. IMPRESSION: Endotracheal and  nasogastric tubes are in grossly good position. Elevated right hemidiaphragm is noted with minimal bibasilar subsegmental atelectasis. Electronically Signed   By: Lupita Raider M.D.   On: 04/13/2023 19:05   CT HEAD WO CONTRAST ( ) Result Date: 04/13/2023 CLINICAL DATA:  Altered mental status. EXAM: CT HEAD WITHOUT CONTRAST TECHNIQUE: Contiguous axial images were obtained from the base of the skull through the vertex without intravenous contrast. RADIATION DOSE REDUCTION: This exam was performed according to the departmental dose-optimization program which includes automated exposure control, adjustment of the mA and/or kV according to patient size and/or use of iterative reconstruction technique. COMPARISON:  MRI brain same day.  CT head from yesterday. FINDINGS: Brain: No evidence of acute infarction, hemorrhage, hydrocephalus, extra-axial collection or mass lesion/mass effect. Vascular: Atherosclerotic vascular calcification of the carotid siphons. No hyperdense vessel. Skull: Normal. Negative for fracture or focal lesion. Sinuses/Orbits: No acute finding. Other: None. IMPRESSION: 1. No acute intracranial abnormality. Electronically Signed   By: Obie Dredge M.D.   On: 04/13/2023 15:37   MR BRAIN WO CONTRAST Result Date: 04/13/2023 CLINICAL DATA:  Neuro deficit, acute, stroke suspected EXAM: MRI HEAD WITHOUT CONTRAST TECHNIQUE: Multiplanar, multiecho pulse sequences of the brain and surrounding structures were obtained without intravenous contrast. COMPARISON:  CT head March 29 25. FINDINGS: Brain: No acute infarction, hemorrhage, hydrocephalus, extra-axial collection or mass lesion. Vascular: Normal flow voids. Skull and upper cervical spine: Normal marrow signal. Mild right scalp edema. Sinuses/Orbits: Mild paranasal sinus mucosal thickening. No acute orbital findings. Other: No mastoid effusions. IMPRESSION: No evidence of acute intracranial abnormality. Electronically Signed   By: Feliberto Harts M.D.   On: 04/13/2023 01:15   CT ABDOMEN PELVIS WO CONTRAST Result Date: 04/12/2023 CLINICAL DATA:  Sepsis EXAM: CT ABDOMEN AND PELVIS WITHOUT CONTRAST TECHNIQUE: Multidetector CT imaging of the abdomen and pelvis was performed following the standard protocol without IV contrast. RADIATION DOSE REDUCTION: This exam was performed according to the departmental dose-optimization program which includes automated exposure control, adjustment of the mA and/or kV according to patient size and/or use of iterative reconstruction technique. COMPARISON:  None Available. FINDINGS: Lower chest: There is peribronchial infiltrate within the right middle lobe dense consolidation within the right lower lobe in keeping with changes of acute infection or aspiration. Extensive airway impaction noted within the right lower lobe. Global cardiac size within normal limits. Hepatobiliary: No focal liver abnormality is seen. Status post cholecystectomy. No biliary dilatation. Pancreas: Unremarkable Spleen: Unremarkable Adrenals/Urinary Tract: The adrenal glands are unremarkable. The kidneys are normal on this noncontrast examination. Foley catheter balloon is seen within a decompressed bladder lumen. Stomach/Bowel: Mild sigmoid diverticulosis. Stomach, small bowel, and large bowel are otherwise unremarkable. Appendix normal. No evidence of obstruction or focal inflammation. No free intraperitoneal  gas or fluid. Vascular/Lymphatic: Aortic atherosclerosis. No enlarged abdominal or pelvic lymph nodes. Reproductive: Prostate is unremarkable. Other: Mild dependent subcutaneous body wall edema. Musculoskeletal: Changes of Paget's disease of bone noted involving the pelvis and visualized right femur. Osseous structures are otherwise age-appropriate. No acute bone abnormality. No lytic or blastic lesion. IMPRESSION: 1. No acute intra-abdominal pathology identified. 2. Right middle and lower lobe infiltrate in keeping with changes of acute  infection or aspiration. Extensive airway impaction within the right lower lobe. 3. Mild sigmoid diverticulosis without superimposed acute inflammatory change. 4. Changes of Paget's disease of bone involving the pelvis and visualized right femur. Aortic Atherosclerosis (ICD10-I70.0). Electronically Signed   By: Helyn Numbers M.D.   On: 04/12/2023 23:29   CT Head Wo Contrast Result Date: 04/12/2023 CLINICAL DATA:  Weakness.  Found in bathtub. EXAM: CT HEAD WITHOUT CONTRAST CT CERVICAL SPINE WITHOUT CONTRAST TECHNIQUE: Multidetector CT imaging of the head and cervical spine was performed following the standard protocol without intravenous contrast. Multiplanar CT image reconstructions of the cervical spine were also generated. RADIATION DOSE REDUCTION: This exam was performed according to the departmental dose-optimization program which includes automated exposure control, adjustment of the mA and/or kV according to patient size and/or use of iterative reconstruction technique. COMPARISON:  CT chest 09/04/2016 FINDINGS: CT HEAD FINDINGS Brain: There is no evidence of an acute infarct, intracranial hemorrhage, mass, midline shift, or extra-axial fluid collection. Cerebral volume is within normal limits for age with normal size of the ventricles. Vascular: No hyperdense vessel or unexpected calcification. Skull: No fracture. 1.1 cm lucent lesion with well-defined sclerotic margin in the lateral aspect of the right orbital roof, nonspecific but without aggressive features. Sinuses/Orbits: Mild mucosal thickening in the paranasal sinuses. No significant mastoid fluid. Bilateral cataract extraction. Other: None. CT CERVICAL SPINE FINDINGS Alignment: Cervical spine straightening. Grade 1 anterolisthesis of C7 on T1. Skull base and vertebrae: No acute fracture. Well-defined 9 mm lucency in the base of the dens, possibly a geode. Soft tissues and spinal canal: No prevertebral fluid or swelling. No visible canal hematoma.  Disc levels: Interbody and facet ankylosis at C5-6. Moderate to severe disc degeneration at C6-7 greater than C3-4 with milder disc degeneration elsewhere in the cervical spine. Widespread advanced cervical facet arthrosis. Moderate to severe multilevel neural foraminal stenosis. Suspected moderate spinal stenosis at C3-4, C5-6, and C6-7. Upper chest: Emphysema. Partially visualized patchy and clustered nodular ground-glass opacities in the right upper lobe. 5 mm subpleural nodule in the medial left upper lobe, unchanged from 2018 and considered benign with no follow-up imaging recommended. Other: None. IMPRESSION: 1. No evidence of acute intracranial abnormality or cervical spine fracture. 2. Partially visualized right upper lobe ground-glass opacities, likely infectious or inflammatory. 3.  Emphysema (ICD10-J43.9). Electronically Signed   By: Sebastian Ache M.D.   On: 04/12/2023 19:26   CT Cervical Spine Wo Contrast Result Date: 04/12/2023 CLINICAL DATA:  Weakness.  Found in bathtub. EXAM: CT HEAD WITHOUT CONTRAST CT CERVICAL SPINE WITHOUT CONTRAST TECHNIQUE: Multidetector CT imaging of the head and cervical spine was performed following the standard protocol without intravenous contrast. Multiplanar CT image reconstructions of the cervical spine were also generated. RADIATION DOSE REDUCTION: This exam was performed according to the departmental dose-optimization program which includes automated exposure control, adjustment of the mA and/or kV according to patient size and/or use of iterative reconstruction technique. COMPARISON:  CT chest 09/04/2016 FINDINGS: CT HEAD FINDINGS Brain: There is no evidence of an acute infarct, intracranial hemorrhage, mass, midline shift,  or extra-axial fluid collection. Cerebral volume is within normal limits for age with normal size of the ventricles. Vascular: No hyperdense vessel or unexpected calcification. Skull: No fracture. 1.1 cm lucent lesion with well-defined sclerotic  margin in the lateral aspect of the right orbital roof, nonspecific but without aggressive features. Sinuses/Orbits: Mild mucosal thickening in the paranasal sinuses. No significant mastoid fluid. Bilateral cataract extraction. Other: None. CT CERVICAL SPINE FINDINGS Alignment: Cervical spine straightening. Grade 1 anterolisthesis of C7 on T1. Skull base and vertebrae: No acute fracture. Well-defined 9 mm lucency in the base of the dens, possibly a geode. Soft tissues and spinal canal: No prevertebral fluid or swelling. No visible canal hematoma. Disc levels: Interbody and facet ankylosis at C5-6. Moderate to severe disc degeneration at C6-7 greater than C3-4 with milder disc degeneration elsewhere in the cervical spine. Widespread advanced cervical facet arthrosis. Moderate to severe multilevel neural foraminal stenosis. Suspected moderate spinal stenosis at C3-4, C5-6, and C6-7. Upper chest: Emphysema. Partially visualized patchy and clustered nodular ground-glass opacities in the right upper lobe. 5 mm subpleural nodule in the medial left upper lobe, unchanged from 2018 and considered benign with no follow-up imaging recommended. Other: None. IMPRESSION: 1. No evidence of acute intracranial abnormality or cervical spine fracture. 2. Partially visualized right upper lobe ground-glass opacities, likely infectious or inflammatory. 3.  Emphysema (ICD10-J43.9). Electronically Signed   By: Sebastian Ache M.D.   On: 04/12/2023 19:26   DG Chest Port 1 View Result Date: 04/12/2023 CLINICAL DATA:  Found in tub. EXAM: PORTABLE CHEST 1 VIEW COMPARISON:  February 29, 2016. FINDINGS: The heart size and mediastinal contours are within normal limits. Left lung is clear. Elevated right hemidiaphragm is noted with minimal right basilar subsegmental atelectasis and pleural effusion. The visualized skeletal structures are unremarkable. IMPRESSION: Elevated right hemidiaphragm with minimal right basilar subsegmental atelectasis and  small pleural effusion. Electronically Signed   By: Lupita Raider M.D.   On: 04/12/2023 18:18    Microbiology: Recent Results (from the past 240 hours)  Blood Culture (routine x 2)     Status: None   Collection Time: 04/12/23  6:17 PM   Specimen: BLOOD  Result Value Ref Range Status   Specimen Description BLOOD BLOOD RIGHT FOREARM  Final   Special Requests   Final    BOTTLES DRAWN AEROBIC AND ANAEROBIC Blood Culture results may not be optimal due to an inadequate volume of blood received in culture bottles   Culture   Final    NO GROWTH 5 DAYS Performed at [facility], [address] [place], [place] [address]    Report Status 05/10/2023 FINAL  Final  Blood Culture (routine x 2)     Status: None   Collection Time: 04/12/23  6:17 PM   Specimen: BLOOD  Result Value Ref Range Status   Specimen Description BLOOD BLOOD RIGHT HAND  Final   Special Requests   Final    BOTTLES DRAWN AEROBIC AND ANAEROBIC Blood Culture results may not be optimal due to an inadequate volume of blood received in culture bottles   Culture   Final    NO GROWTH 5 DAYS Performed at [facility], [address] [place], [place] [address]    Report Status 04/26/2023 FINAL  Final  Respiratory (~20 pathogens) panel by PCR     Status: None   Collection Time: 04/13/23  5:07 PM   Specimen: Nasopharyngeal Swab; Respiratory  Result Value Ref Range Status   Adenovirus NOT DETECTED  NOT DETECTED Final   Coronavirus 229E NOT DETECTED NOT DETECTED Final    Comment: (NOTE) The Coronavirus on the Respiratory Panel, DOES NOT test for the novel  Coronavirus (2019 nCoV)    Coronavirus HKU1 NOT DETECTED NOT DETECTED Final   Coronavirus NL63 NOT DETECTED NOT DETECTED Final   Coronavirus OC43 NOT DETECTED NOT DETECTED Final   Metapneumovirus NOT DETECTED NOT DETECTED Final   Rhinovirus / Enterovirus NOT DETECTED NOT DETECTED Final   Influenza A NOT DETECTED NOT DETECTED Final   Influenza B NOT  DETECTED NOT DETECTED Final   Parainfluenza Virus 1 NOT DETECTED NOT DETECTED Final   Parainfluenza Virus 2 NOT DETECTED NOT DETECTED Final   Parainfluenza Virus 3 NOT DETECTED NOT DETECTED Final   Parainfluenza Virus 4 NOT DETECTED NOT DETECTED Final   Respiratory Syncytial Virus NOT DETECTED NOT DETECTED Final   Bordetella pertussis NOT DETECTED NOT DETECTED Final   Bordetella Parapertussis NOT DETECTED NOT DETECTED Final   Chlamydophila pneumoniae NOT DETECTED NOT DETECTED Final   Mycoplasma pneumoniae NOT DETECTED NOT DETECTED Final    Comment: Performed at [facility], [address] [place], [place] [address]  SARS Coronavirus 2 by RT PCR (hospital order, performed in [facility] hospital lab) *cepheid single result test* Anterior Nasal Swab     Status: None   Collection Time: 04/13/23  5:07 PM   Specimen: Anterior Nasal Swab  Result Value Ref Range Status   SARS Coronavirus 2 by RT PCR NEGATIVE NEGATIVE Final    Comment: (NOTE) SARS-CoV-2 target nucleic acids are NOT DETECTED.  The SARS-CoV-2 RNA is generally detectable in upper and lower respiratory specimens during the acute phase of infection. The lowest concentration of SARS-CoV-2 viral copies this assay can detect is 250 copies / mL. A negative result does not preclude SARS-CoV-2 infection and should not be used as the sole basis for treatment or other patient management decisions.  A negative result may occur with improper specimen collection / handling, submission of specimen other than nasopharyngeal swab, presence of viral mutation(s) within the areas targeted by this assay, and inadequate number of viral copies (<250 copies / mL). A negative result must be combined with clinical observations, patient history, and epidemiological information.  Fact Sheet for Patients:   RoadLapTop.co.za  Fact Sheet for Healthcare Providers: http://kim-miller.com/  This test is  not yet approved or  cleared by the [place] FDA and has been authorized for detection and/or diagnosis of SARS-CoV-2 by FDA under an Emergency Use Authorization (EUA).  This EUA will remain in effect (meaning this test can be used) for the duration of the COVID-19 declaration under Section 564(b)(1) of the Act, 21 U.S.C. section 360bbb-3(b)(1), unless the authorization is terminated or revoked sooner.  Performed at [facility], [address] [place], [place] [address]   MRSA Next Gen by PCR, Nasal     Status: Abnormal   Collection Time: 04/13/23 10:59 PM   Specimen: Nasal Mucosa; Nasal Swab  Result Value Ref Range Status   MRSA by PCR Next Gen DETECTED (A) NOT DETECTED Final    Comment: RESULT CALLED TO, READ BACK BY AND VERIFIED WITH: Reynolds Bowl RN ICU @ 0425 04/14/23 LFD (NOTE) The GeneXpert MRSA Assay (FDA approved for NASAL specimens only), is one component of a comprehensive MRSA colonization surveillance program. It is not intended to diagnose MRSA infection nor to guide or monitor treatment for MRSA infections. Test performance is not FDA approved in patients less than  81 years old. Performed at [facility], [address] [place], [place] [address]   Culture, Respiratory w Gram Stain (tracheal aspirate)     Status: None (Preliminary result)   Collection Time: 04/14/23 11:56 AM   Specimen: Tracheal Aspirate; Respiratory  Result Value Ref Range Status   Specimen Description   Final    TRACHEAL ASPIRATE Performed at [facility], [address] [place], [place] [address]    Special Requests   Final    NONE Performed at [facility], [address] [place], [place] [address]    Gram Stain   Final    ABUNDANT WBC PRESENT, PREDOMINANTLY PMN RARE GRAM POSITIVE COCCI IN PAIRS    Culture   Final    RARE STAPHYLOCOCCUS AUREUS SUSCEPTIBILITIES TO FOLLOW RARE GRAM NEGATIVE RODS IDENTIFICATION AND SUSCEPTIBILITIES TO  FOLLOW Performed at [facility], [address] [place], [place] [address]    Report Status PENDING  Incomplete    Time spent: 35 minutes  Signed: Gillis Santa, MD 05/07/2023

## 2023-05-15 NOTE — Plan of Care (Signed)
 Notes reviewed.  Up to see patient and family to assess and offer support as daughter discussed yesterday her intention to be at patient's bedside when he died.  Patient has died, no family at bedside.  Per nursing family was not present at the time of death this morning, but have been made aware, and should be arriving this afternoon to see patient and to complete paperwork.  PMT signing off.

## 2023-05-15 NOTE — Discharge Planning (Addendum)
 Patient TOD 0755.  Verified by Felipa Furnace, RN and primary RN Jackquline Bosch, RN) Dr., palliative, [facility], Charge RN  and daughter/POA notified.  Also notified [facility].  Not candidate ([phone]).    Son to be arriving from [place] via flight tis afternoon.  Family plans to be able to arrive about 1300 today.  Also will be able to give funeral information once here.  Has requested to leave patient in room till they're able to see.  NT will prepare body and be ready for family when able.  0900 - Unfortunately, per house coverage, we will NOT be able to hold the patient in his room (207) until daughter and son are able to arrive.  Patient will be relocated to morgue when able. AC indicated that the daughter and son will have full access to the patient once they arrive around 1P and possibly extended family (if possible) at a later time. This RN notified the Daughter/POA of the needed change in plans.  She responded, "this sucks, I have to say goodbye in the morgue - that's just not right.  He deserves better."

## 2023-05-15 NOTE — Plan of Care (Signed)
  Problem: Fluid Volume: Goal: Hemodynamic stability will improve Outcome: Adequate for Discharge   Problem: Clinical Measurements: Goal: Diagnostic test results will improve Outcome: Adequate for Discharge Goal: Signs and symptoms of infection will decrease Outcome: Adequate for Discharge   Problem: Respiratory: Goal: Ability to maintain adequate ventilation will improve Outcome: Adequate for Discharge   Problem: Education: Goal: Knowledge of General Education information will improve Description: Including pain rating scale, medication(s)/side effects and non-pharmacologic comfort measures Outcome: Adequate for Discharge   Problem: Health Behavior/Discharge Planning: Goal: Ability to manage health-related needs will improve Outcome: Adequate for Discharge   Problem: Clinical Measurements: Goal: Ability to maintain clinical measurements within normal limits will improve Outcome: Adequate for Discharge Goal: Will remain free from infection Outcome: Adequate for Discharge Goal: Diagnostic test results will improve Outcome: Adequate for Discharge Goal: Respiratory complications will improve Outcome: Adequate for Discharge Goal: Cardiovascular complication will be avoided Outcome: Adequate for Discharge   Problem: Activity: Goal: Risk for activity intolerance will decrease Outcome: Adequate for Discharge   Problem: Nutrition: Goal: Adequate nutrition will be maintained Outcome: Adequate for Discharge   Problem: Coping: Goal: Level of anxiety will decrease Outcome: Adequate for Discharge   Problem: Elimination: Goal: Will not experience complications related to bowel motility Outcome: Adequate for Discharge Goal: Will not experience complications related to urinary retention Outcome: Adequate for Discharge   Problem: Pain Managment: Goal: General experience of comfort will improve and/or be controlled Outcome: Adequate for Discharge   Problem: Safety: Goal:  Ability to remain free from injury will improve Outcome: Adequate for Discharge   Problem: Skin Integrity: Goal: Risk for impaired skin integrity will decrease Outcome: Adequate for Discharge   Problem: Education: Goal: Knowledge of the prescribed therapeutic regimen will improve Outcome: Adequate for Discharge   Problem: Coping: Goal: Ability to identify and develop effective coping behavior will improve Outcome: Adequate for Discharge   Problem: Clinical Measurements: Goal: Quality of life will improve Outcome: Adequate for Discharge   Problem: Respiratory: Goal: Verbalizations of increased ease of respirations will increase Outcome: Adequate for Discharge   Problem: Role Relationship: Goal: Family's ability to cope with current situation will improve Outcome: Adequate for Discharge Goal: Ability to verbalize concerns, feelings, and thoughts to partner or family member will improve Outcome: Adequate for Discharge   Problem: Pain Management: Goal: Satisfaction with pain management regimen will improve Outcome: Adequate for Discharge

## 2023-05-15 NOTE — Plan of Care (Signed)
  Problem: Fluid Volume: Goal: Hemodynamic stability will improve Outcome: Progressing   Problem: Clinical Measurements: Goal: Diagnostic test results will improve Outcome: Progressing Goal: Signs and symptoms of infection will decrease Outcome: Progressing   Problem: Respiratory: Goal: Ability to maintain adequate ventilation will improve Outcome: Progressing   Problem: Education: Goal: Knowledge of General Education information will improve Description: Including pain rating scale, medication(s)/side effects and non-pharmacologic comfort measures Outcome: Progressing   Problem: Health Behavior/Discharge Planning: Goal: Ability to manage health-related needs will improve Outcome: Progressing   Problem: Clinical Measurements: Goal: Ability to maintain clinical measurements within normal limits will improve Outcome: Progressing Goal: Will remain free from infection Outcome: Progressing Goal: Diagnostic test results will improve Outcome: Progressing Goal: Respiratory complications will improve Outcome: Progressing Goal: Cardiovascular complication will be avoided Outcome: Progressing   Problem: Activity: Goal: Risk for activity intolerance will decrease Outcome: Progressing   Problem: Nutrition: Goal: Adequate nutrition will be maintained Outcome: Progressing   Problem: Coping: Goal: Level of anxiety will decrease Outcome: Progressing   Problem: Elimination: Goal: Will not experience complications related to bowel motility Outcome: Progressing Goal: Will not experience complications related to urinary retention Outcome: Progressing   Problem: Pain Managment: Goal: General experience of comfort will improve and/or be controlled Outcome: Progressing   Problem: Safety: Goal: Ability to remain free from injury will improve Outcome: Progressing   Problem: Skin Integrity: Goal: Risk for impaired skin integrity will decrease Outcome: Progressing   Problem:  Education: Goal: Knowledge of the prescribed therapeutic regimen will improve Outcome: Progressing   Problem: Coping: Goal: Ability to identify and develop effective coping behavior will improve Outcome: Progressing   Problem: Clinical Measurements: Goal: Quality of life will improve Outcome: Progressing   Problem: Respiratory: Goal: Verbalizations of increased ease of respirations will increase Outcome: Progressing   Problem: Role Relationship: Goal: Family's ability to cope with current situation will improve Outcome: Progressing Goal: Ability to verbalize concerns, feelings, and thoughts to partner or family member will improve Outcome: Progressing   Problem: Pain Management: Goal: Satisfaction with pain management regimen will improve Outcome: Progressing

## 2023-05-15 DEATH — deceased

## 2023-06-03 ENCOUNTER — Ambulatory Visit: Payer: Self-pay | Admitting: Urology
